# Patient Record
Sex: Male | Born: 1935 | Race: White | Hispanic: No | Marital: Married | Smoking: Never smoker
Health system: Southern US, Community
[De-identification: ages and names within clinical notes are randomized; demographics above are authoritative.]

## PROBLEM LIST (undated history)

## (undated) DIAGNOSIS — K859 Acute pancreatitis without necrosis or infection, unspecified: Secondary | ICD-10-CM

## (undated) DIAGNOSIS — J45909 Unspecified asthma, uncomplicated: Secondary | ICD-10-CM

## (undated) DIAGNOSIS — H25019 Cortical age-related cataract, unspecified eye: Secondary | ICD-10-CM

## (undated) DIAGNOSIS — M810 Age-related osteoporosis without current pathological fracture: Secondary | ICD-10-CM

## (undated) DIAGNOSIS — N189 Chronic kidney disease, unspecified: Secondary | ICD-10-CM

## (undated) DIAGNOSIS — E785 Hyperlipidemia, unspecified: Secondary | ICD-10-CM

## (undated) DIAGNOSIS — J189 Pneumonia, unspecified organism: Secondary | ICD-10-CM

## (undated) DIAGNOSIS — H9190 Unspecified hearing loss, unspecified ear: Secondary | ICD-10-CM

## (undated) DIAGNOSIS — D649 Anemia, unspecified: Secondary | ICD-10-CM

## (undated) DIAGNOSIS — S42309A Unspecified fracture of shaft of humerus, unspecified arm, initial encounter for closed fracture: Secondary | ICD-10-CM

## (undated) DIAGNOSIS — K219 Gastro-esophageal reflux disease without esophagitis: Secondary | ICD-10-CM

## (undated) DIAGNOSIS — K635 Polyp of colon: Secondary | ICD-10-CM

## (undated) DIAGNOSIS — I10 Essential (primary) hypertension: Secondary | ICD-10-CM

## (undated) DIAGNOSIS — F0390 Unspecified dementia without behavioral disturbance: Secondary | ICD-10-CM

## (undated) DIAGNOSIS — C801 Malignant (primary) neoplasm, unspecified: Secondary | ICD-10-CM

## (undated) HISTORY — PX: NASAL POLYP EXCISION: SHX2068

## (undated) HISTORY — PX: COLONOSCOPY: SHX174

## (undated) HISTORY — DX: Hyperlipidemia, unspecified: E78.5

## (undated) HISTORY — PX: BACK SURGERY: SHX140

## (undated) HISTORY — DX: Unspecified fracture of shaft of humerus, unspecified arm, initial encounter for closed fracture: S42.309A

## (undated) HISTORY — DX: Essential (primary) hypertension: I10

## (undated) HISTORY — PX: NEPHRECTOMY: SHX65

---

## 1997-03-02 HISTORY — PX: FLEXIBLE SIGMOIDOSCOPY: SHX1649

## 2000-09-11 HISTORY — PX: ESOPHAGOGASTRODUODENOSCOPY: SHX1529

## 2004-03-23 ENCOUNTER — Other Ambulatory Visit: Payer: Self-pay

## 2005-07-27 ENCOUNTER — Ambulatory Visit: Payer: Self-pay | Admitting: Unknown Physician Specialty

## 2011-02-05 ENCOUNTER — Ambulatory Visit: Payer: Self-pay | Admitting: Unknown Physician Specialty

## 2015-12-27 DIAGNOSIS — Z85828 Personal history of other malignant neoplasm of skin: Secondary | ICD-10-CM | POA: Diagnosis not present

## 2015-12-27 DIAGNOSIS — L820 Inflamed seborrheic keratosis: Secondary | ICD-10-CM | POA: Diagnosis not present

## 2015-12-27 DIAGNOSIS — D485 Neoplasm of uncertain behavior of skin: Secondary | ICD-10-CM | POA: Diagnosis not present

## 2015-12-27 DIAGNOSIS — L905 Scar conditions and fibrosis of skin: Secondary | ICD-10-CM | POA: Diagnosis not present

## 2015-12-27 DIAGNOSIS — D044 Carcinoma in situ of skin of scalp and neck: Secondary | ICD-10-CM | POA: Diagnosis not present

## 2015-12-28 DIAGNOSIS — L570 Actinic keratosis: Secondary | ICD-10-CM | POA: Diagnosis not present

## 2015-12-28 DIAGNOSIS — X32XXXA Exposure to sunlight, initial encounter: Secondary | ICD-10-CM | POA: Diagnosis not present

## 2016-01-02 DIAGNOSIS — D044 Carcinoma in situ of skin of scalp and neck: Secondary | ICD-10-CM | POA: Diagnosis not present

## 2016-01-30 DIAGNOSIS — I10 Essential (primary) hypertension: Secondary | ICD-10-CM | POA: Diagnosis not present

## 2016-02-07 DIAGNOSIS — Z0001 Encounter for general adult medical examination with abnormal findings: Secondary | ICD-10-CM | POA: Diagnosis not present

## 2016-02-07 DIAGNOSIS — I10 Essential (primary) hypertension: Secondary | ICD-10-CM | POA: Diagnosis not present

## 2016-02-07 DIAGNOSIS — E785 Hyperlipidemia, unspecified: Secondary | ICD-10-CM | POA: Diagnosis not present

## 2016-02-07 DIAGNOSIS — J4520 Mild intermittent asthma, uncomplicated: Secondary | ICD-10-CM | POA: Diagnosis not present

## 2016-02-29 DIAGNOSIS — H40003 Preglaucoma, unspecified, bilateral: Secondary | ICD-10-CM | POA: Diagnosis not present

## 2016-05-08 DIAGNOSIS — Z85828 Personal history of other malignant neoplasm of skin: Secondary | ICD-10-CM | POA: Diagnosis not present

## 2016-05-08 DIAGNOSIS — L570 Actinic keratosis: Secondary | ICD-10-CM | POA: Diagnosis not present

## 2016-05-08 DIAGNOSIS — X32XXXA Exposure to sunlight, initial encounter: Secondary | ICD-10-CM | POA: Diagnosis not present

## 2016-05-08 DIAGNOSIS — L821 Other seborrheic keratosis: Secondary | ICD-10-CM | POA: Diagnosis not present

## 2016-05-08 DIAGNOSIS — Z08 Encounter for follow-up examination after completed treatment for malignant neoplasm: Secondary | ICD-10-CM | POA: Diagnosis not present

## 2016-05-22 DIAGNOSIS — B351 Tinea unguium: Secondary | ICD-10-CM | POA: Diagnosis not present

## 2016-05-22 DIAGNOSIS — Q6689 Other  specified congenital deformities of feet: Secondary | ICD-10-CM | POA: Diagnosis not present

## 2016-05-22 DIAGNOSIS — M79674 Pain in right toe(s): Secondary | ICD-10-CM | POA: Diagnosis not present

## 2016-05-22 DIAGNOSIS — M7751 Other enthesopathy of right foot: Secondary | ICD-10-CM | POA: Diagnosis not present

## 2016-05-22 DIAGNOSIS — M79675 Pain in left toe(s): Secondary | ICD-10-CM | POA: Diagnosis not present

## 2016-07-31 DIAGNOSIS — I10 Essential (primary) hypertension: Secondary | ICD-10-CM | POA: Diagnosis not present

## 2016-08-07 DIAGNOSIS — J4520 Mild intermittent asthma, uncomplicated: Secondary | ICD-10-CM | POA: Diagnosis not present

## 2016-08-07 DIAGNOSIS — E785 Hyperlipidemia, unspecified: Secondary | ICD-10-CM | POA: Diagnosis not present

## 2016-08-07 DIAGNOSIS — Z0000 Encounter for general adult medical examination without abnormal findings: Secondary | ICD-10-CM | POA: Diagnosis not present

## 2016-08-07 DIAGNOSIS — Z125 Encounter for screening for malignant neoplasm of prostate: Secondary | ICD-10-CM | POA: Diagnosis not present

## 2016-08-07 DIAGNOSIS — I10 Essential (primary) hypertension: Secondary | ICD-10-CM | POA: Diagnosis not present

## 2016-08-07 DIAGNOSIS — Z23 Encounter for immunization: Secondary | ICD-10-CM | POA: Diagnosis not present

## 2016-10-03 DIAGNOSIS — M79675 Pain in left toe(s): Secondary | ICD-10-CM | POA: Diagnosis not present

## 2016-10-03 DIAGNOSIS — M79674 Pain in right toe(s): Secondary | ICD-10-CM | POA: Diagnosis not present

## 2016-10-03 DIAGNOSIS — L97511 Non-pressure chronic ulcer of other part of right foot limited to breakdown of skin: Secondary | ICD-10-CM | POA: Diagnosis not present

## 2016-10-03 DIAGNOSIS — B351 Tinea unguium: Secondary | ICD-10-CM | POA: Diagnosis not present

## 2016-12-11 DIAGNOSIS — Z85828 Personal history of other malignant neoplasm of skin: Secondary | ICD-10-CM | POA: Diagnosis not present

## 2016-12-11 DIAGNOSIS — C44329 Squamous cell carcinoma of skin of other parts of face: Secondary | ICD-10-CM | POA: Diagnosis not present

## 2016-12-11 DIAGNOSIS — L570 Actinic keratosis: Secondary | ICD-10-CM | POA: Diagnosis not present

## 2016-12-11 DIAGNOSIS — D485 Neoplasm of uncertain behavior of skin: Secondary | ICD-10-CM | POA: Diagnosis not present

## 2016-12-11 DIAGNOSIS — D044 Carcinoma in situ of skin of scalp and neck: Secondary | ICD-10-CM | POA: Diagnosis not present

## 2016-12-11 DIAGNOSIS — Z08 Encounter for follow-up examination after completed treatment for malignant neoplasm: Secondary | ICD-10-CM | POA: Diagnosis not present

## 2016-12-11 DIAGNOSIS — X32XXXA Exposure to sunlight, initial encounter: Secondary | ICD-10-CM | POA: Diagnosis not present

## 2016-12-11 DIAGNOSIS — L821 Other seborrheic keratosis: Secondary | ICD-10-CM | POA: Diagnosis not present

## 2017-01-16 DIAGNOSIS — L905 Scar conditions and fibrosis of skin: Secondary | ICD-10-CM | POA: Diagnosis not present

## 2017-01-16 DIAGNOSIS — C44329 Squamous cell carcinoma of skin of other parts of face: Secondary | ICD-10-CM | POA: Diagnosis not present

## 2017-01-24 DIAGNOSIS — D044 Carcinoma in situ of skin of scalp and neck: Secondary | ICD-10-CM | POA: Diagnosis not present

## 2017-01-31 DIAGNOSIS — Z125 Encounter for screening for malignant neoplasm of prostate: Secondary | ICD-10-CM | POA: Diagnosis not present

## 2017-01-31 DIAGNOSIS — E785 Hyperlipidemia, unspecified: Secondary | ICD-10-CM | POA: Diagnosis not present

## 2017-01-31 DIAGNOSIS — I10 Essential (primary) hypertension: Secondary | ICD-10-CM | POA: Diagnosis not present

## 2017-02-12 DIAGNOSIS — E785 Hyperlipidemia, unspecified: Secondary | ICD-10-CM | POA: Diagnosis not present

## 2017-02-12 DIAGNOSIS — Z0001 Encounter for general adult medical examination with abnormal findings: Secondary | ICD-10-CM | POA: Diagnosis not present

## 2017-02-12 DIAGNOSIS — J4520 Mild intermittent asthma, uncomplicated: Secondary | ICD-10-CM | POA: Diagnosis not present

## 2017-02-12 DIAGNOSIS — I10 Essential (primary) hypertension: Secondary | ICD-10-CM | POA: Diagnosis not present

## 2017-03-06 DIAGNOSIS — H40003 Preglaucoma, unspecified, bilateral: Secondary | ICD-10-CM | POA: Diagnosis not present

## 2017-04-25 DIAGNOSIS — L570 Actinic keratosis: Secondary | ICD-10-CM | POA: Diagnosis not present

## 2017-04-25 DIAGNOSIS — L821 Other seborrheic keratosis: Secondary | ICD-10-CM | POA: Diagnosis not present

## 2017-04-25 DIAGNOSIS — X32XXXA Exposure to sunlight, initial encounter: Secondary | ICD-10-CM | POA: Diagnosis not present

## 2017-04-25 DIAGNOSIS — D485 Neoplasm of uncertain behavior of skin: Secondary | ICD-10-CM | POA: Diagnosis not present

## 2017-04-25 DIAGNOSIS — C44519 Basal cell carcinoma of skin of other part of trunk: Secondary | ICD-10-CM | POA: Diagnosis not present

## 2017-04-25 DIAGNOSIS — Z08 Encounter for follow-up examination after completed treatment for malignant neoplasm: Secondary | ICD-10-CM | POA: Diagnosis not present

## 2017-04-25 DIAGNOSIS — Z85828 Personal history of other malignant neoplasm of skin: Secondary | ICD-10-CM | POA: Diagnosis not present

## 2017-05-08 DIAGNOSIS — L905 Scar conditions and fibrosis of skin: Secondary | ICD-10-CM | POA: Diagnosis not present

## 2017-05-08 DIAGNOSIS — C44519 Basal cell carcinoma of skin of other part of trunk: Secondary | ICD-10-CM | POA: Diagnosis not present

## 2017-06-03 DIAGNOSIS — M79675 Pain in left toe(s): Secondary | ICD-10-CM | POA: Diagnosis not present

## 2017-06-03 DIAGNOSIS — B351 Tinea unguium: Secondary | ICD-10-CM | POA: Diagnosis not present

## 2017-06-03 DIAGNOSIS — M79674 Pain in right toe(s): Secondary | ICD-10-CM | POA: Diagnosis not present

## 2017-08-01 DIAGNOSIS — L97511 Non-pressure chronic ulcer of other part of right foot limited to breakdown of skin: Secondary | ICD-10-CM | POA: Diagnosis not present

## 2017-08-01 DIAGNOSIS — M7751 Other enthesopathy of right foot: Secondary | ICD-10-CM | POA: Diagnosis not present

## 2017-08-01 DIAGNOSIS — Q6689 Other  specified congenital deformities of feet: Secondary | ICD-10-CM | POA: Diagnosis not present

## 2017-08-01 DIAGNOSIS — M2580 Other specified joint disorders, unspecified joint: Secondary | ICD-10-CM | POA: Diagnosis not present

## 2017-08-01 DIAGNOSIS — M7752 Other enthesopathy of left foot: Secondary | ICD-10-CM | POA: Diagnosis not present

## 2017-08-06 DIAGNOSIS — I10 Essential (primary) hypertension: Secondary | ICD-10-CM | POA: Diagnosis not present

## 2017-08-13 DIAGNOSIS — Z8619 Personal history of other infectious and parasitic diseases: Secondary | ICD-10-CM | POA: Diagnosis not present

## 2017-08-13 DIAGNOSIS — J4520 Mild intermittent asthma, uncomplicated: Secondary | ICD-10-CM | POA: Diagnosis not present

## 2017-08-13 DIAGNOSIS — Z5181 Encounter for therapeutic drug level monitoring: Secondary | ICD-10-CM | POA: Diagnosis not present

## 2017-08-13 DIAGNOSIS — I10 Essential (primary) hypertension: Secondary | ICD-10-CM | POA: Diagnosis not present

## 2017-08-13 DIAGNOSIS — E785 Hyperlipidemia, unspecified: Secondary | ICD-10-CM | POA: Diagnosis not present

## 2017-09-10 DIAGNOSIS — Z08 Encounter for follow-up examination after completed treatment for malignant neoplasm: Secondary | ICD-10-CM | POA: Diagnosis not present

## 2017-09-10 DIAGNOSIS — Z85828 Personal history of other malignant neoplasm of skin: Secondary | ICD-10-CM | POA: Diagnosis not present

## 2017-09-10 DIAGNOSIS — L570 Actinic keratosis: Secondary | ICD-10-CM | POA: Diagnosis not present

## 2017-09-10 DIAGNOSIS — X32XXXA Exposure to sunlight, initial encounter: Secondary | ICD-10-CM | POA: Diagnosis not present

## 2018-04-02 ENCOUNTER — Other Ambulatory Visit: Payer: Self-pay | Admitting: Nurse Practitioner

## 2018-04-02 DIAGNOSIS — R634 Abnormal weight loss: Secondary | ICD-10-CM

## 2018-05-06 ENCOUNTER — Ambulatory Visit
Admission: RE | Admit: 2018-05-06 | Discharge: 2018-05-06 | Disposition: A | Discharge status: Home / Self Care | Payer: Medicare HMO | Source: Ambulatory Visit | Attending: Nurse Practitioner | Admitting: Nurse Practitioner

## 2018-05-06 DIAGNOSIS — N2889 Other specified disorders of kidney and ureter: Secondary | ICD-10-CM | POA: Diagnosis not present

## 2018-05-06 DIAGNOSIS — R634 Abnormal weight loss: Secondary | ICD-10-CM | POA: Insufficient documentation

## 2018-05-06 HISTORY — DX: Unspecified asthma, uncomplicated: J45.909

## 2018-05-06 LAB — POCT I-STAT CREATININE: Creatinine, Ser: 0.7 mg/dL (ref 0.61–1.24)

## 2018-05-06 MED ORDER — IOHEXOL 300 MG/ML  SOLN
100.0000 mL | Freq: Once | INTRAMUSCULAR | Status: AC | PRN
  Administered 2018-05-06: 100 mL via INTRAVENOUS

## 2018-05-13 ENCOUNTER — Inpatient Hospital Stay: Payer: Medicare HMO

## 2018-05-13 ENCOUNTER — Other Ambulatory Visit: Payer: Self-pay

## 2018-05-13 ENCOUNTER — Inpatient Hospital Stay: Payer: Medicare HMO | Attending: Oncology | Admitting: Oncology

## 2018-05-13 ENCOUNTER — Encounter: Payer: Self-pay | Admitting: Oncology

## 2018-05-13 VITALS — BP 117/74 | HR 79 | Temp 97.6°F | Resp 18 | Ht 72.0 in | Wt 160.9 lb

## 2018-05-13 DIAGNOSIS — R634 Abnormal weight loss: Secondary | ICD-10-CM | POA: Diagnosis not present

## 2018-05-13 DIAGNOSIS — Z79899 Other long term (current) drug therapy: Secondary | ICD-10-CM | POA: Diagnosis not present

## 2018-05-13 DIAGNOSIS — Z86010 Personal history of colonic polyps: Secondary | ICD-10-CM | POA: Diagnosis not present

## 2018-05-13 DIAGNOSIS — N2889 Other specified disorders of kidney and ureter: Secondary | ICD-10-CM | POA: Insufficient documentation

## 2018-05-13 DIAGNOSIS — I700 Atherosclerosis of aorta: Secondary | ICD-10-CM | POA: Insufficient documentation

## 2018-05-13 DIAGNOSIS — Z803 Family history of malignant neoplasm of breast: Secondary | ICD-10-CM | POA: Diagnosis not present

## 2018-05-13 DIAGNOSIS — N400 Enlarged prostate without lower urinary tract symptoms: Secondary | ICD-10-CM | POA: Insufficient documentation

## 2018-05-13 DIAGNOSIS — E785 Hyperlipidemia, unspecified: Secondary | ICD-10-CM | POA: Insufficient documentation

## 2018-05-13 DIAGNOSIS — Z800 Family history of malignant neoplasm of digestive organs: Secondary | ICD-10-CM

## 2018-05-13 DIAGNOSIS — I10 Essential (primary) hypertension: Secondary | ICD-10-CM | POA: Insufficient documentation

## 2018-05-13 DIAGNOSIS — K5710 Diverticulosis of small intestine without perforation or abscess without bleeding: Secondary | ICD-10-CM | POA: Insufficient documentation

## 2018-05-13 LAB — COMPREHENSIVE METABOLIC PANEL
ALBUMIN: 4.2 g/dL (ref 3.5–5.0)
ALT: 27 U/L (ref 0–44)
ANION GAP: 9 (ref 5–15)
AST: 25 U/L (ref 15–41)
Alkaline Phosphatase: 43 U/L (ref 38–126)
BILIRUBIN TOTAL: 0.9 mg/dL (ref 0.3–1.2)
BUN: 15 mg/dL (ref 8–23)
CHLORIDE: 97 mmol/L — AB (ref 98–111)
CO2: 25 mmol/L (ref 22–32)
Calcium: 9.3 mg/dL (ref 8.9–10.3)
Creatinine, Ser: 0.67 mg/dL (ref 0.61–1.24)
GFR calc Af Amer: >60 mL/min (ref >60)
GFR calc non Af Amer: >60 mL/min (ref >60)
GLUCOSE: 94 mg/dL (ref 70–99)
POTASSIUM: 3.7 mmol/L (ref 3.5–5.1)
SODIUM: 131 mmol/L — AB (ref 135–145)
Total Protein: 7 g/dL (ref 6.5–8.1)

## 2018-05-13 LAB — URINALYSIS, COMPLETE (UACMP) WITH MICROSCOPIC
BACTERIA UA: NONE SEEN
Bilirubin Urine: NEGATIVE
Glucose, UA: NEGATIVE mg/dL
Hgb urine dipstick: NEGATIVE
KETONES UR: NEGATIVE mg/dL
LEUKOCYTES UA: NEGATIVE
NITRITE: NEGATIVE
PROTEIN: NEGATIVE mg/dL
SQUAMOUS EPITHELIAL / LPF: NONE SEEN (ref 0–5)
Specific Gravity, Urine: 1.013 (ref 1.005–1.030)
pH: 7 (ref 5.0–8.0)

## 2018-05-13 LAB — CBC WITH DIFFERENTIAL/PLATELET
BASOS ABS: 0 10*3/uL (ref 0–0.1)
BASOS PCT: 1 %
EOS ABS: 0 10*3/uL (ref 0–0.7)
Eosinophils Relative: 1 %
HEMATOCRIT: 39.8 % — AB (ref 40.0–52.0)
Hemoglobin: 13.7 g/dL (ref 13.0–18.0)
Lymphocytes Relative: 16 %
Lymphs Abs: 0.6 10*3/uL — ABNORMAL LOW (ref 1.0–3.6)
MCH: 32 pg (ref 26.0–34.0)
MCHC: 34.4 g/dL (ref 32.0–36.0)
MCV: 93.1 fL (ref 80.0–100.0)
Monocytes Absolute: 0.5 10*3/uL (ref 0.2–1.0)
Monocytes Relative: 13 %
NEUTROS ABS: 2.7 10*3/uL (ref 1.4–6.5)
Neutrophils Relative %: 69 %
PLATELETS: 182 10*3/uL (ref 150–440)
RBC: 4.27 MIL/uL — ABNORMAL LOW (ref 4.40–5.90)
RDW: 13.6 % (ref 11.5–14.5)
WBC: 3.9 10*3/uL (ref 3.8–10.6)

## 2018-05-13 LAB — LACTATE DEHYDROGENASE: LDH: 140 U/L (ref 98–192)

## 2018-05-13 MED ORDER — ALPRAZOLAM 0.5 MG PO TABS: 0.5000 mg | ORAL_TABLET | Freq: Two times a day (BID) | ORAL | 0 refills | Status: DC | PRN

## 2018-05-13 NOTE — Progress Notes (Signed)
Hematology/Oncology Consult note [facility] Telephone:([phone] [phone]   Patient Care Team: Baxter Hire, MD as PCP - General (Internal Medicine)  REFERRING PROVIDER: Denice Paradise CHIEF COMPLAINTS/REASON FOR VISIT:  Evaluation of kidney mass  HISTORY OF PRESENTING ILLNESS:  Kevin [name] is a  84 y.o.  male with PMH listed below who was referred to me for evaluation of kidney mass.  Patient was recently seen by gastroenterology Dr. Vira Agar at the request of patient's primary care physician for consultation for evaluation and management of weight loss, history of colon polyp and a family history of colon cancer. Patient reports that he has lost 20 pounds for the past several years.  May be 4 pounds over the past 6 months. Reports that patient does not eat much and try to eat healthy food. There appears to be some intentional cutting back on calories.  Patient reports he is not as hungry as he used to be. Patient remains very active for his age.  He goes to gym 3 times a week.  Denies any abdominal pain, diarrhea, constipation change of bowel habits.  Denies seeing any blood in the stool. Denies drenching night sweats, fever or chills. Per GI note, last colonoscopy was in 2012 which reviewed benign polyps and internal hemorrhoids, small area of erosion in the cecum biopsy showed chronic active colitis.  Due to patient's age, he is not quite interested to have colonoscopy done and prefers to do noninvasive test first.  So a CT abdomen Pelvis with contrast was obtained.  CT image showed a 6.7 cm mass in the upper pole of the right kidney and a 1.5 cm mass in the lower pole of the left kidney.  Both suspicious for renal cell carcinoma. There is no evidence of metastatic disease within the abdomen or pelvis. Also noted large duodenal diverticulum, mildly enlarged prostate and aortic athero-sclerosis. Patient denies any flank pain, or  hematuria. Family history reviewed. Two sisters diagnosed with breast cancer.   Reviewed patient's previous lab work in [facility]  TSH was checked on 01/31/2017, normal.  PSA 2.05,   Review of Systems  Constitutional: Positive for weight loss. Negative for chills, fever and malaise/fatigue.  HENT: Negative for nosebleeds and sore throat.   Eyes: Negative for double vision, photophobia and redness.  Respiratory: Negative for cough, shortness of breath and wheezing.   Cardiovascular: Negative for chest pain, palpitations and orthopnea.  Gastrointestinal: Negative for abdominal pain, blood in stool, nausea and vomiting.  Genitourinary: Negative for dysuria.  Musculoskeletal: Negative for back pain, myalgias and neck pain.  Skin: Negative for itching and rash.  Neurological: Negative for dizziness, tingling and tremors.  Endo/Heme/Allergies: Negative for environmental allergies. Does not bruise/bleed easily.  Psychiatric/Behavioral: Negative for depression.    MEDICAL HISTORY:  Past Medical History:  Diagnosis Date  . Asthma   . Broken arm    Pt was 19  . Hyperlipidemia   . Hypertension     SURGICAL HISTORY: History reviewed. No pertinent surgical history.  SOCIAL HISTORY: Social History   Socioeconomic History  . Marital status: Married    Spouse name: Not on file  . Number of children: Not on file  . Years of education: Not on file  . Highest education level: Not on file  Occupational History  . Not on file  Social Needs  . Financial resource strain: Not on file  . Food insecurity:    Worry: Not on file    Inability: Not on  file  . Transportation needs:    Medical: Not on file    Non-medical: Not on file  Tobacco Use  . Smoking status: Never Smoker  . Smokeless tobacco: Never Used  Substance and Sexual Activity  . Alcohol use: Never    Frequency: Never  . Drug use: Never  . Sexual activity: Not on file  Lifestyle  . Physical activity:    Days per week: Not on  file    Minutes per session: Not on file  . Stress: Not on file  Relationships  . Social connections:    Talks on phone: Not on file    Gets together: Not on file    Attends religious service: Not on file    Active member of club or organization: Not on file    Attends meetings of clubs or organizations: Not on file    Relationship status: Not on file  . Intimate partner violence:    Fear of current or ex partner: Not on file    Emotionally abused: Not on file    Physically abused: Not on file    Forced sexual activity: Not on file  Other Topics Concern  . Not on file  Social History Narrative  . Not on file    FAMILY HISTORY: Family History  Problem Relation Age of Onset  . Heart disease Mother   . Pancreatitis Mother   . Emphysema Father   . Breast cancer Sister   . Breast cancer Sister     ALLERGIES:  has No Known Allergies.  MEDICATIONS:  Current Outpatient Medications  Medication Sig Dispense Refill  . albuterol (PROVENTIL HFA) 108 (90 Base) MCG/ACT inhaler Inhale into the lungs every 6 (six) hours as needed.    Marland Kitchen amLODipine (NORVASC) 5 MG tablet Take 5 mg by mouth daily.    . hydrochlorothiazide (HYDRODIURIL) 25 MG tablet Take 25 mg by mouth daily.    . simvastatin (ZOCOR) 10 MG tablet Take 10 mg by mouth daily.    Marland Kitchen ALPRAZolam (XANAX) 0.5 MG tablet Take 1 tablet (0.5 mg total) by mouth 2 (two) times daily as needed for anxiety. 2 tablet 0   No current facility-administered medications for this visit.      PHYSICAL EXAMINATION: ECOG PERFORMANCE STATUS: 0 - Asymptomatic Vitals:   05/13/18 1115  BP: 117/74  Pulse: 79  Resp: 18  Temp: 97.6 F (36.4 C)   Filed Weights   05/13/18 1115  Weight: 160 lb 14.4 oz (73 kg)    Physical Exam  Constitutional: He is oriented to person, place, and time. He appears well-developed and well-nourished. No distress.  HENT:  Head: Normocephalic and atraumatic.  Right Ear: External ear normal.  Left Ear: External ear  normal.  Mouth/Throat: Oropharynx is clear and moist.  Eyes: Pupils are equal, round, and reactive to light. Conjunctivae and EOM are normal. No scleral icterus.  Neck: Normal range of motion. Neck supple.  Cardiovascular: Normal rate, regular rhythm and normal heart sounds.  Pulmonary/Chest: Effort normal and breath sounds normal. No respiratory distress. He has no wheezes. He has no rales. He exhibits no tenderness.  Abdominal: Soft. Bowel sounds are normal. He exhibits no distension and no mass. There is no tenderness.  Musculoskeletal: Normal range of motion. He exhibits no edema or deformity.  Lymphadenopathy:    He has no cervical adenopathy.  Neurological: He is alert and oriented to person, place, and time. No cranial nerve deficit. Coordination normal.  Skin: Skin is warm and  dry. No rash noted.  Psychiatric: He has a normal mood and affect. His behavior is normal. Thought content normal.     LABORATORY DATA:  I have reviewed the data as listed Lab Results  Component Value Date   WBC 3.9 05/13/2018   HGB 13.7 05/13/2018   HCT 39.8 (L) 05/13/2018   MCV 93.1 05/13/2018   PLT 182 05/13/2018   Recent Labs    05/06/18 0930 05/13/18 1147  NA  --  131*  K  --  3.7  CL  --  97*  CO2  --  25  GLUCOSE  --  94  BUN  --  15  CREATININE 0.70 0.67  CALCIUM  --  9.3  GFRNONAA  --  >60  GFRAA  --  >60  PROT  --  7.0  ALBUMIN  --  4.2  AST  --  25  ALT  --  27  ALKPHOS  --  43  BILITOT  --  0.9   Iron/TIBC/Ferritin/ %Sat No results found for: IRON, TIBC, FERRITIN, IRONPCTSAT      ASSESSMENT & PLAN:  1. Kidney mass   2. Weight loss    CT images were independently reviewed by me and discussed with patient and his wife. Both masses are concerning for malignancy, RCC vs other histology. Discussed with patient that I will send baseline labs today including CBC, CMP, UA and LDH. I recommend obtaining abdominal MRI with and without contrast for further evaluation. After  MRI resulted I will order CT image to complete staging. I will refer patient to see urology for evaluation of surgical resection. Adjuvant treatment plan pending on final pathology reports histology/extent of disease.  Orders Placed This Encounter  Procedures  . MR Abdomen W Wo Contrast    Standing Status:   Future    Standing Expiration Date:   05/13/2019    Order Specific Question:   If indicated for the ordered procedure, I authorize the administration of contrast media per Radiology protocol    Answer:   Yes    Order Specific Question:   What is the patient's sedation requirement?    Answer:   No Sedation    Order Specific Question:   Does the patient have a pacemaker or implanted devices?    Answer:   No    Order Specific Question:   Radiology Contrast Protocol - do NOT remove file path    Answer:   [email or web address]    Order Specific Question:   Preferred imaging location?    Answer:   [facility] (table limit-300lbs)  . CBC with Differential/Platelet    Standing Status:   Future    Number of Occurrences:   1    Standing Expiration Date:   05/14/2019  . Comprehensive metabolic panel    Standing Status:   Future    Number of Occurrences:   1    Standing Expiration Date:   05/14/2019  . Lactate dehydrogenase    Standing Status:   Future    Number of Occurrences:   1    Standing Expiration Date:   05/14/2019  . Urinalysis, Complete w Microscopic    Standing Status:   Future    Number of Occurrences:   1    Standing Expiration Date:   05/14/2019  . Ambulatory referral to Urology    Referral Priority:   Routine    Referral Type:   Consultation    Referral Reason:   Specialty Services Required  Referred to Provider:   Hollice Espy, MD    Requested Specialty:   Urology    Number of Visits Requested:   1    All questions were answered. The patient knows to call the clinic with any problems questions or concerns.  Return of visit: to be  determined.  Thank you for this kind referral and the opportunity to participate in the care of this patient. A copy of today's note is routed to referring provider  Total face to face encounter time for this patient visit was 60 min. >50% of the time was  spent in counseling and coordination of care.    Earlie Server, MD, PhD Hematology Oncology [facility] Pager- [phone] 05/13/2018

## 2018-05-13 NOTE — Progress Notes (Signed)
Patient here for initial evaluation. He states he is anxious being here.

## 2018-05-26 ENCOUNTER — Other Ambulatory Visit: Payer: Self-pay | Admitting: Oncology

## 2018-05-26 ENCOUNTER — Ambulatory Visit
Admission: RE | Admit: 2018-05-26 | Discharge: 2018-05-26 | Disposition: A | Discharge status: Home / Self Care | Payer: Medicare HMO | Source: Ambulatory Visit | Attending: Oncology | Admitting: Oncology

## 2018-05-26 DIAGNOSIS — K5710 Diverticulosis of small intestine without perforation or abscess without bleeding: Secondary | ICD-10-CM | POA: Diagnosis not present

## 2018-05-26 DIAGNOSIS — N2889 Other specified disorders of kidney and ureter: Secondary | ICD-10-CM

## 2018-05-26 DIAGNOSIS — I700 Atherosclerosis of aorta: Secondary | ICD-10-CM | POA: Diagnosis not present

## 2018-05-26 DIAGNOSIS — R634 Abnormal weight loss: Secondary | ICD-10-CM

## 2018-05-26 DIAGNOSIS — K769 Liver disease, unspecified: Secondary | ICD-10-CM | POA: Insufficient documentation

## 2018-05-26 MED ORDER — GADOBENATE DIMEGLUMINE 529 MG/ML IV SOLN
15.0000 mL | Freq: Once | INTRAVENOUS | Status: AC | PRN
  Administered 2018-05-26: 15 mL via INTRAVENOUS

## 2018-06-02 ENCOUNTER — Telehealth: Payer: Self-pay | Admitting: *Deleted

## 2018-06-02 NOTE — Telephone Encounter (Signed)
Per Velna Hatchet 06/02/18 scheduler message to cancel 06/03/18 CT scan due to insurance is still pending.  CT scheduled for 06/03/18 has been cancelled as requested.  I called and spoke with patient's wife Kevin [name]) and made her aware that once it has been approved appt will be R/S And that they will be notified in regards to the scheduled date and time.

## 2018-06-03 ENCOUNTER — Ambulatory Visit: Payer: Medicare HMO

## 2018-06-04 ENCOUNTER — Other Ambulatory Visit: Payer: Self-pay | Admitting: Radiology

## 2018-06-04 ENCOUNTER — Encounter: Payer: Self-pay | Admitting: Urology

## 2018-06-04 ENCOUNTER — Ambulatory Visit (INDEPENDENT_AMBULATORY_CARE_PROVIDER_SITE_OTHER): Payer: Medicare HMO | Admitting: Urology

## 2018-06-04 VITALS — BP 119/76 | HR 83 | Ht 72.0 in | Wt 164.0 lb

## 2018-06-04 DIAGNOSIS — N281 Cyst of kidney, acquired: Secondary | ICD-10-CM | POA: Diagnosis not present

## 2018-06-04 DIAGNOSIS — N2889 Other specified disorders of kidney and ureter: Secondary | ICD-10-CM

## 2018-06-04 NOTE — Progress Notes (Signed)
06/04/2018 9:22 AM   Kevin [name] Jan 26, 1933 [id number]  Referring provider: Baxter Hire, MD Kevin [name], Kevin [name] [id number]  Chief Complaint  Patient presents with  . Renal Mass    New Patient    HPI: 84 year old male with unintentional weight loss he underwent CT abdomen pelvis for further evaluation of this found to have a 6.7 cm right upper pole renal mass as well as a suspicious lesion measuring 1.5 cm in the left lower pole of the kidney.  Over the past several years, he has had an unintentional 20 pound weight loss.  This is not been precipitous and is almost a few pounds over the last 6 months.  He has not been actively trying to lose weight but has been physically active.  He underwent CT of the abdomen and pelvis on 05/06/2018 for further evaluation.  This showed a large heterogeneous right upper posterior lateral pole lesion measuring 6.7 x 4.7 cm which is relatively exophytic.  In addition to this, there is an increased attenuating lesion in the left lower pole measuring 1.5 x 1.3 cm.  No adenopathy or hilar involvement was noted.  No evidence of metastatic disease in the abdomen or pelvis.    Study was followed up with an MR of the abdomen with and without contrast indicating features which may be consistent with papillary type the exophytic left lower pole lesion was favored to be a Bosniak 2 lesion.  He was seen and evaluated by Dr. Tasia [name] of medical oncology and has been scheduled for a chest CT on 06/10/2018.  He denies any flank pain or gross hematuria.  He is otherwise asymptomatic.    Creatinine 0.67  Today, he is in excellent shape.  He does most of his own ADLs.  He does feel it about a month ago but his wife 32 years.  They still go to the gym on a regular basis.  PMH: Past Medical History:  Diagnosis Date  . Asthma   . Broken arm    Pt was 19  . Hyperlipidemia   . Hypertension     Surgical History: History reviewed. No pertinent  surgical history.  Home Medications:  Allergies as of 06/04/2018   No Known Allergies     Medication List        Accurate as of 06/04/18  9:22 AM. Always use your most recent med list.          ALPRAZolam 0.5 MG tablet Commonly known as:  XANAX Take 1 tablet (0.5 mg total) by mouth 2 (two) times daily as needed for anxiety.   amLODipine 5 MG tablet Commonly known as:  NORVASC Take 5 mg by mouth daily.   hydrochlorothiazide 25 MG tablet Commonly known as:  HYDRODIURIL Take 25 mg by mouth daily.   PROVENTIL HFA 108 (90 Base) MCG/ACT inhaler Generic drug:  albuterol Inhale into the lungs every 6 (six) hours as needed.   simvastatin 10 MG tablet Commonly known as:  ZOCOR Take 10 mg by mouth daily.       Allergies: No Known Allergies  Family History: Family History  Problem Relation Age of Onset  . Heart disease Mother   . Pancreatitis Mother   . Emphysema Father   . Breast cancer Sister   . Breast cancer Sister     Social History:  reports that he has never smoked. He has never used smokeless tobacco. He reports that he does not drink alcohol or use drugs.  ROS: UROLOGY Frequent Urination?: Yes Hard to postpone urination?: No Burning/pain with urination?: No Get up at night to urinate?: Yes Leakage of urine?: No Urine stream starts and stops?: Yes Trouble starting stream?: No Do you have to strain to urinate?: No Blood in urine?: No Urinary tract infection?: No Sexually transmitted disease?: No Injury to kidneys or bladder?: No Painful intercourse?: No Weak stream?: No Erection problems?: Yes Penile pain?: No  Gastrointestinal Nausea?: No Vomiting?: No Indigestion/heartburn?: Yes Diarrhea?: No Constipation?: Yes  Constitutional Fever: No Night sweats?: No Weight loss?: Yes Fatigue?: Yes  Skin Skin rash/lesions?: No Itching?: No  Eyes Blurred vision?: No Double vision?: No  Ears/Nose/Throat Sore throat?: No Sinus problems?:  No  Hematologic/Lymphatic Swollen glands?: No Easy bruising?: No  Cardiovascular Leg swelling?: No Chest pain?: No  Respiratory Cough?: No Shortness of breath?: No  Endocrine Excessive thirst?: No  Musculoskeletal Back pain?: No Joint pain?: No  Neurological Headaches?: No Dizziness?: No  Psychologic Depression?: No Anxiety?: No  Physical Exam: BP 119/76   Pulse 83   Ht 6' (1.829 m)   Wt 164 lb (74.4 kg)   BMI 22.24 kg/m   Constitutional:  Alert and oriented, No acute distress.  Accompanied by wife today. HEENT: Kevin [name] AT, moist mucus membranes.  Trachea midline, no masses. Cardiovascular: No clubbing, cyanosis, or edema. Respiratory: Normal respiratory effort, no increased work of breathing. Skin: No rashes, bruises or suspicious lesions. Neurologic: Grossly intact, no focal deficits, moving all 4 extremities. Psychiatric: Normal mood and affect.  Laboratory Data: Lab Results  Component Value Date   WBC 3.9 05/13/2018   HGB 13.7 05/13/2018   HCT 39.8 (L) 05/13/2018   MCV 93.1 05/13/2018   PLT 182 05/13/2018    Lab Results  Component Value Date   CREATININE 0.67 05/13/2018    Urinalysis    Component Value Date/Time   COLORURINE YELLOW (A) 05/13/2018 1201   APPEARANCEUR CLEAR (A) 05/13/2018 1201   LABSPEC 1.013 05/13/2018 1201   PHURINE 7.0 05/13/2018 1201   GLUCOSEU NEGATIVE 05/13/2018 1201   HGBUR NEGATIVE 05/13/2018 1201   BILIRUBINUR NEGATIVE 05/13/2018 1201   KETONESUR NEGATIVE 05/13/2018 1201   PROTEINUR NEGATIVE 05/13/2018 1201   NITRITE NEGATIVE 05/13/2018 1201   LEUKOCYTESUR NEGATIVE 05/13/2018 1201    Lab Results  Component Value Date   BACTERIA NONE SEEN 05/13/2018    Pertinent Imaging: CLINICAL DATA:  Unexplained 30 pound weight loss. Bilateral renal masses.  EXAM: MRI ABDOMEN WITHOUT AND WITH CONTRAST  TECHNIQUE: Multiplanar multisequence MR imaging of the abdomen was performed both before and after the administration  of intravenous contrast.  CONTRAST:  36mL MULTIHANCE GADOBENATE DIMEGLUMINE 529 MG/ML IV SOLN  COMPARISON:  05/06/2018 CT scan  FINDINGS: Lower chest: Unremarkable  Hepatobiliary: Several small nonenhancing hepatic lesions are compatible with cysts. Slight septation of a peripheral right hepatic lobe cyst measuring 1.3 by 1.6 cm on image 35/15, potentially with a small adjacent vascular malformation or hemangioma. Gallbladder unremarkable. No appreciable biliary dilatation.  Pancreas:  Unremarkable  Spleen:  Unremarkable  Adrenals/Urinary Tract:  The adrenal glands appear normal.  Heterogeneous 6.8 by 5.3 by 5.3 cm mass extending posterolaterally from the right kidney upper pole demonstrates mild but appreciable internal enhancement and heterogeneous but primarily low T1 and T2 signal characteristics compatible with renal cell carcinoma. The preponderance of low T2 signal would favor papillary subtype. The lesion abuts the capsular margin of the liver, without definite liver invasion. There is an incidental small adjacent hepatic cyst.  A  1.6 by 1.3 cm left kidney lower pole exophytic complex lesion has high precontrast T1 signal characteristics and does not definitively enhance. Subtraction series 20 suggests enhancement but is probably due to misregistration given the lack of significant variance in directly measured signal intensity in the lesion between the 3 minutes and precontrast images.  Several additional simple renal cysts are present.  Stomach/Bowel: Large periampullary duodenal diverticulum.  Vascular/Lymphatic: Aortoiliac atherosclerotic vascular disease. No tumor thrombus in the right renal vein. No pathologic adenopathy.  Other:  No supplemental non-categorized findings.  Musculoskeletal: Levoconvex lumbar scoliosis  IMPRESSION: 1. Complex exophytic 6.8 cm mass of the right kidney upper pole demonstrates some low-level enhancement  against a background of heterogeneous internal signal, most compatible with renal cell carcinoma. No findings of tumor thrombus in the right renal vein or adenopathy. This lesion abuts the capsular margin of the right hepatic lobe, without obvious invasion. 2. Exophytic lesion of the left kidney lower pole is not definitively enhancing based on manual signal intensity measurements, and accordingly favors a Bosniak category 2 complex cyst. Misregistration on the subtraction images likely accounts for the visibility of this lesion on the subtraction images (ordinarily, nonenhancing lesions would not be visible on subtraction imaging). Given the questionable enhancement of this lesion on CT, surveillance may be warranted. 3.  Aortic Atherosclerosis (ICD10-I70.0). 4. Large periampullary duodenal diverticulum. 5. Scattered benign-appearing hepatic lesions including cysts.   Electronically Signed   By: Van Clines M.D.   On: 05/26/2018 10:14  CLINICAL DATA:  Unintentional 30 lb weight loss over past year.  EXAM: CT ABDOMEN AND PELVIS WITH CONTRAST  TECHNIQUE: Multidetector CT imaging of the abdomen and pelvis was performed using the standard protocol following bolus administration of intravenous contrast.  CONTRAST:  115mL OMNIPAQUE IOHEXOL 300 MG/ML  SOLN  COMPARISON:  None.  FINDINGS: Lower Chest: No acute findings.  Hepatobiliary: No hepatic masses identified. Several tiny fluid attenuation hepatic cysts are noted. Gallbladder is unremarkable.  Pancreas:  No mass or inflammatory changes.  Spleen: Within normal limits in size and appearance.  Adrenals/Urinary Tract: Adrenal glands are normal in appearance. Several simple renal cysts are seen bilaterally. A large subcapsular lesion showing heterogeneous increased attenuation is seen in the posterior upper pole of the right kidney measuring 6.7 x 4.7 cm. A smaller subcapsular lesion is seen in the lateral  lower pole the left kidney which also has increased attenuation and measures 1.5 x 1.3 cm. Both of these are suspicious for solid enhancing masses. No evidence urolithiasis or hydronephrosis. Unremarkable unopacified urinary bladder.  Stomach/Bowel: No evidence of obstruction, inflammatory process or abnormal fluid collections. A large proximal duodenal diverticulum is seen measuring approximately 6 cm. Normal appendix visualized.  Vascular/Lymphatic: No pathologically enlarged lymph nodes. No abdominal aortic aneurysm. Aortic atherosclerosis.  Reproductive: Mildly enlarged prostate gland. Symmetric seminal vesicles.  Other:  None.  Musculoskeletal:  No suspicious bone lesions identified.  IMPRESSION: 6.7 cm mass in upper pole of right kidney and 1.5 cm mass in lower pole of left kidney, both suspicious for renal cell carcinoma. Abdomen MRI without and with contrast is recommended for further evaluation.  No evidence of metastatic disease within the abdomen or pelvis.  Incidentally noted: Large duodenal diverticulum, mildly enlarged prostate, and aortic atherosclerosis.   Electronically Signed   By: Earle Gell M.D.   On: 05/06/2018 12:02  Both CT abdomen pelvis and MR with and without contrast were personally reviewed today.  Agree with radiologic interpretation.  The studies were also reviewed  personally with the patient and his wife today.  Assessment & Plan:    1. Right renal mass Large exophytic right upper pole renal mass measuring 6.7 cm highly concerning for renal cell carcinoma but may be more indolent type such as papillary RCC No previous imaging for review for comparison or to establish growth rate No evidence of local invasion or local metastatic disease Agree with chest CT to ensure that there is no metastatic disease to the lung  Lesion does appear to be amenable to partial nephrectomy although this is relatively high risk surgery especially in  octogenarian As such, I strongly recommended seeing renal biopsy prior to any surgical intervention to ensure that this is a malignant tumor and not a more indolent form of cancer such as papillary type I for which surveillance may be considered He is agreeable this plan, we briefly reviewed the risk and benefits of renal biopsy - US Guided Needle Placement; Future  2. Renal cyst, left 1.5 cm left lower pole lesion of unknown certainty May represent a Bosniak lesion based on MR Will likely continue to surveillance for the time being   Return in about 2 weeks (around 06/18/2018) for f/u after renal biopsy and CT chest.  Hollice Espy, MD  [place] [address] [place], [place] [address] [phone]

## 2018-06-06 ENCOUNTER — Telehealth: Payer: Self-pay | Admitting: Radiology

## 2018-06-06 NOTE — Telephone Encounter (Signed)
Notified patient of renal mass biopsy scheduled 06/18/2018 with arrival time of 9:30 to [facility] registration desk. Npo after midnight & needs a driver. Advised to take Norvasc with a sip of water morning of procedure. Questions answered. Patient voices understanding.

## 2018-06-10 ENCOUNTER — Ambulatory Visit
Admission: RE | Admit: 2018-06-10 | Discharge: 2018-06-10 | Disposition: A | Discharge status: Home / Self Care | Payer: Medicare HMO | Source: Ambulatory Visit | Attending: Oncology | Admitting: Oncology

## 2018-06-10 DIAGNOSIS — N2889 Other specified disorders of kidney and ureter: Secondary | ICD-10-CM | POA: Insufficient documentation

## 2018-06-10 DIAGNOSIS — I700 Atherosclerosis of aorta: Secondary | ICD-10-CM | POA: Diagnosis not present

## 2018-06-10 DIAGNOSIS — E041 Nontoxic single thyroid nodule: Secondary | ICD-10-CM | POA: Insufficient documentation

## 2018-06-10 DIAGNOSIS — R911 Solitary pulmonary nodule: Secondary | ICD-10-CM | POA: Insufficient documentation

## 2018-06-10 DIAGNOSIS — R634 Abnormal weight loss: Secondary | ICD-10-CM

## 2018-06-17 ENCOUNTER — Other Ambulatory Visit: Payer: Self-pay | Admitting: Radiology

## 2018-06-18 ENCOUNTER — Ambulatory Visit
Admission: RE | Admit: 2018-06-18 | Discharge: 2018-06-18 | Disposition: A | Discharge status: Home / Self Care | Payer: Medicare HMO | Source: Ambulatory Visit | Attending: Urology | Admitting: Urology

## 2018-06-18 DIAGNOSIS — R634 Abnormal weight loss: Secondary | ICD-10-CM | POA: Insufficient documentation

## 2018-06-18 DIAGNOSIS — Z803 Family history of malignant neoplasm of breast: Secondary | ICD-10-CM | POA: Insufficient documentation

## 2018-06-18 DIAGNOSIS — J45909 Unspecified asthma, uncomplicated: Secondary | ICD-10-CM | POA: Diagnosis not present

## 2018-06-18 DIAGNOSIS — Z8489 Family history of other specified conditions: Secondary | ICD-10-CM | POA: Insufficient documentation

## 2018-06-18 DIAGNOSIS — Z6822 Body mass index (BMI) 22.0-22.9, adult: Secondary | ICD-10-CM | POA: Diagnosis not present

## 2018-06-18 DIAGNOSIS — N2889 Other specified disorders of kidney and ureter: Secondary | ICD-10-CM | POA: Diagnosis present

## 2018-06-18 DIAGNOSIS — C649 Malignant neoplasm of unspecified kidney, except renal pelvis: Secondary | ICD-10-CM | POA: Insufficient documentation

## 2018-06-18 DIAGNOSIS — I10 Essential (primary) hypertension: Secondary | ICD-10-CM | POA: Diagnosis not present

## 2018-06-18 DIAGNOSIS — Z825 Family history of asthma and other chronic lower respiratory diseases: Secondary | ICD-10-CM | POA: Insufficient documentation

## 2018-06-18 DIAGNOSIS — E785 Hyperlipidemia, unspecified: Secondary | ICD-10-CM | POA: Diagnosis not present

## 2018-06-18 DIAGNOSIS — Z8249 Family history of ischemic heart disease and other diseases of the circulatory system: Secondary | ICD-10-CM | POA: Diagnosis not present

## 2018-06-18 DIAGNOSIS — Z79899 Other long term (current) drug therapy: Secondary | ICD-10-CM | POA: Insufficient documentation

## 2018-06-18 LAB — CBC WITH DIFFERENTIAL/PLATELET
Basophils Absolute: 0 10*3/uL (ref 0–0.1)
Basophils Relative: 1 %
EOS ABS: 0.2 10*3/uL (ref 0–0.7)
Eosinophils Relative: 4 %
HCT: 40.5 % (ref 40.0–52.0)
HEMOGLOBIN: 14.1 g/dL (ref 13.0–18.0)
LYMPHS ABS: 0.8 10*3/uL — AB (ref 1.0–3.6)
Lymphocytes Relative: 17 %
MCH: 32.3 pg (ref 26.0–34.0)
MCHC: 34.9 g/dL (ref 32.0–36.0)
MCV: 92.5 fL (ref 80.0–100.0)
MONO ABS: 0.6 10*3/uL (ref 0.2–1.0)
MONOS PCT: 12 %
NEUTROS PCT: 66 %
Neutro Abs: 3.3 10*3/uL (ref 1.4–6.5)
Platelets: 184 10*3/uL (ref 150–440)
RBC: 4.38 MIL/uL — ABNORMAL LOW (ref 4.40–5.90)
RDW: 13.4 % (ref 11.5–14.5)
WBC: 4.9 10*3/uL (ref 3.8–10.6)

## 2018-06-18 LAB — BASIC METABOLIC PANEL
Anion gap: 9 (ref 5–15)
BUN: 14 mg/dL (ref 8–23)
CHLORIDE: 99 mmol/L (ref 98–111)
CO2: 28 mmol/L (ref 22–32)
CREATININE: 0.76 mg/dL (ref 0.61–1.24)
Calcium: 9.3 mg/dL (ref 8.9–10.3)
GFR calc non Af Amer: >60 mL/min (ref >60)
GLUCOSE: 95 mg/dL (ref 70–99)
Potassium: 3.4 mmol/L — ABNORMAL LOW (ref 3.5–5.1)
Sodium: 136 mmol/L (ref 135–145)

## 2018-06-18 LAB — PROTIME-INR
INR: 0.91
PROTHROMBIN TIME: 12.2 s (ref 11.4–15.2)

## 2018-06-18 MED ORDER — SODIUM CHLORIDE 0.9 % IV SOLN
INTRAVENOUS | Status: DC
  Administered 2018-06-18: 10:00:00 via INTRAVENOUS

## 2018-06-18 MED ORDER — MIDAZOLAM HCL 2 MG/2ML IJ SOLN
INTRAMUSCULAR | Status: AC | PRN
  Administered 2018-06-18: 1 mg via INTRAVENOUS

## 2018-06-18 MED ORDER — FENTANYL CITRATE (PF) 100 MCG/2ML IJ SOLN
INTRAMUSCULAR | Status: AC
  Filled 2018-06-18: qty 4

## 2018-06-18 MED ORDER — MIDAZOLAM HCL 2 MG/2ML IJ SOLN
INTRAMUSCULAR | Status: AC
  Filled 2018-06-18: qty 2

## 2018-06-18 MED ORDER — LIDOCAINE HCL 2 % IJ SOLN
INTRAMUSCULAR | Status: AC | PRN
  Administered 2018-06-18: 10 mL via INTRADERMAL

## 2018-06-18 MED ORDER — FENTANYL CITRATE (PF) 100 MCG/2ML IJ SOLN
INTRAMUSCULAR | Status: AC | PRN
  Administered 2018-06-18 (×2): 50 ug via INTRAVENOUS

## 2018-06-18 NOTE — Procedures (Signed)
Interventional Radiology Procedure Note  Procedure: US guided biopsy of right renal mass  Complications: None  Estimated Blood Loss:  < 10 mL  Findings: Right renal mass sampled via 17 G trocar needle.  18 G core biopsy performed x 3 within mass.  Gelfoam pledgets instilled via outer needle on completion.  Venetia Night. Kathlene Cote, M.D Pager:  [phone]

## 2018-06-18 NOTE — Discharge Instructions (Signed)
Moderate Conscious Sedation, Adult, Care After °These instructions provide you with information about caring for yourself after your procedure. Your health care provider may also give you more specific instructions. Your treatment has been planned according to current medical practices, but problems sometimes occur. Call your health care provider if you have any problems or questions after your procedure. °What can I expect after the procedure? °After your procedure, it is common: °· To feel sleepy for several hours. °· To feel clumsy and have poor balance for several hours. °· To have poor judgment for several hours. °· To vomit if you eat too soon. ° °Follow these instructions at home: °For at least 24 hours after the procedure: ° °· Do not: °? Participate in activities where you could fall or become injured. °? Drive. °? Use heavy machinery. °? Drink alcohol. °? Take sleeping pills or medicines that cause drowsiness. °? Make important decisions or sign legal documents. °? Take care of children on your own. °· Rest. °Eating and drinking °· Follow the diet recommended by your health care provider. °· If you vomit: °? Drink water, juice, or soup when you can drink without vomiting. °? Make sure you have little or no nausea before eating solid foods. °General instructions °· Have a responsible adult stay with you until you are awake and alert. °· Take over-the-counter and prescription medicines only as told by your health care provider. °· If you smoke, do not smoke without supervision. °· Keep all follow-up visits as told by your health care provider. This is important. °Contact a health care provider if: °· You keep feeling nauseous or you keep vomiting. °· You feel light-headed. °· You develop a rash. °· You have a fever. °Get help right away if: °· You have trouble breathing. °This information is not intended to replace advice given to you by your health care provider. Make sure you discuss any questions you have  with your health care provider. °Document Released: 07/29/2013 Document Revised: 03/12/2016 Document Reviewed: 01/28/2016 °Elsevier Interactive Patient Education © 2018 [employer] °Percutaneous Kidney Biopsy, Care After °This sheet gives you information about how to care for yourself after your procedure. Your health care provider may also give you more specific instructions. If you have problems or questions, contact your health care provider. °What can I expect after the procedure? °After the procedure, it is common to have: °· Pain or soreness near the area where the needle went through your skin (biopsy site). °· Bright pink or cloudy urine for 24 hours after the procedure. ° °Follow these instructions at home: °Activity °· Return to your normal activities as told by your health care provider. Ask your health care provider what activities are safe for you. °· Do not drive for 24 hours if you were given a medicine to help you relax (sedative). °· Do not lift anything that is heavier than 10 lb (4.5 kg) until your health care provider tells you that it is safe. °· Avoid activities that take a lot of effort (are strenuous) until your health care provider approves. Most people will have to wait 2 weeks before returning to activities such as exercise or sexual intercourse. °General instructions °· Take over-the-counter and prescription medicines only as told by your health care provider. °· You may eat and drink after your procedure. Follow instructions from your health care provider about eating or drinking restrictions. °· Check your biopsy site every day for signs of infection. Check for: °? More redness, swelling, or   pain. °? More fluid or blood. °? Warmth. °? Pus or a bad smell. °· Keep all follow-up visits as told by your health care provider. This is important. °Contact a health care provider if: °· You have more redness, swelling, or pain around your biopsy site. °· You have more fluid or blood coming from  your biopsy site. °· Your biopsy site feels warm to the touch. °· You have pus or a bad smell coming from your biopsy site. °· You have blood in your urine more than 24 hours after your procedure. °Get help right away if: °· You have dark red or brown urine. °· You have a fever. °· You are unable to urinate. °· You feel burning when you urinate. °· You feel faint. °· You have severe pain in your abdomen or side. °This information is not intended to replace advice given to you by your health care provider. Make sure you discuss any questions you have with your health care provider. °Document Released: 06/10/2013 Document Revised: 07/20/2016 Document Reviewed: 07/20/2016 °Elsevier Interactive Patient Education © 2018 [employer] ° °

## 2018-06-18 NOTE — H&P (Signed)
Chief Complaint: Patient was seen in consultation today for right renal mass biopsy at the request of [place]  Referring Physician(s): Brandon,Ashley  Patient Status: Kevin [name] - Out-pt  History of Present Illness: Kevin [name] is a 84 y.o. male with a 6.7 cm right upper pole solid renal mass demonstrating contrast enhancement by MRI and suspicious for renal carcinoma.  Another 1.5 cm lesion of lower pole of left kidney has appearance of a complex cyst by MRI.  No evidence of metastatic disease in chest, abdomen or pelvis.  Here for biopsy of the right renal mass to help with surgical decision making and establish a tissue diagnosis.  He has had significant weight loss.  No pain, hematuria or other urinary symptoms.  Past Medical History:  Diagnosis Date  . Asthma   . Broken arm    Pt was 19  . Hyperlipidemia   . Hypertension     History reviewed. No pertinent surgical history.  Allergies: Patient has no known allergies.  Medications: Prior to Admission medications   Medication Sig Start Date End Date Taking? Authorizing Provider  amLODipine (NORVASC) 5 MG tablet Take 5 mg by mouth daily. 01/22/18  Yes [provider]  hydrochlorothiazide (HYDRODIURIL) 25 MG tablet Take 25 mg by mouth daily. 01/22/18  Yes [provider]  simvastatin (ZOCOR) 10 MG tablet Take 10 mg by mouth daily. 01/22/18  Yes [provider]  albuterol (PROVENTIL HFA) 108 (90 Base) MCG/ACT inhaler Inhale into the lungs every 6 (six) hours as needed. 02/10/18 02/10/19  [provider]  ALPRAZolam Duanne Moron) 0.5 MG tablet Take 1 tablet (0.5 mg total) by mouth 2 (two) times daily as needed for anxiety. 05/13/18   Earlie Server, MD     Family History  Problem Relation Age of Onset  . Heart disease Mother   . Pancreatitis Mother   . Emphysema Father   . Breast cancer Sister   . Breast cancer Sister     Social History   Socioeconomic History  . Marital status: Married   Spouse name: Not on file  . Number of children: Not on file  . Years of education: Not on file  . Highest education level: Not on file  Occupational History  . Not on file  Social Needs  . Financial resource strain: Not on file  . Food insecurity:    Worry: Not on file    Inability: Not on file  . Transportation needs:    Medical: Not on file    Non-medical: Not on file  Tobacco Use  . Smoking status: Never Smoker  . Smokeless tobacco: Never Used  Substance and Sexual Activity  . Alcohol use: Never    Frequency: Never  . Drug use: Never  . Sexual activity: Not on file  Lifestyle  . Physical activity:    Days per week: Not on file    Minutes per session: Not on file  . Stress: Not on file  Relationships  . Social connections:    Talks on phone: Not on file    Gets together: Not on file    Attends religious service: Not on file    Active member of club or organization: Not on file    Attends meetings of clubs or organizations: Not on file    Relationship status: Not on file  Other Topics Concern  . Not on file  Social History Narrative  . Not on file    ECOG Status: 1 - Symptomatic but completely  ambulatory  Review of Systems: A 12 point ROS discussed and pertinent positives are indicated in the HPI above.  All other systems are negative.  Review of Systems  Constitutional: Positive for fatigue and unexpected weight change.  HENT: Negative.   Respiratory: Negative.   Cardiovascular: Negative.   Gastrointestinal: Negative.   Genitourinary: Negative.   Musculoskeletal: Negative.   Neurological: Negative.     Vital Signs: BP 127/76 (BP Location: Left Arm)   Pulse 80   Temp (!) 97.5 F (36.4 C) (Oral)   Resp 13   Ht 6' (1.829 m)   Wt 74.4 kg   SpO2 98%   BMI 22.24 kg/m   Physical Exam  Constitutional: He is oriented to person, place, and time. No distress.  HENT:  Head: Normocephalic and atraumatic.  Neck: Neck supple. No JVD present. No thyromegaly  present.  Cardiovascular: Normal rate, regular rhythm and normal heart sounds. Exam reveals no gallop and no friction rub.  No murmur heard. Pulmonary/Chest: Effort normal and breath sounds normal. No stridor. No respiratory distress. He has no wheezes. He has no rales.  Abdominal: Soft. Bowel sounds are normal. He exhibits no distension and no mass. There is no tenderness. There is no rebound and no guarding.  Musculoskeletal: He exhibits no edema.  Neurological: He is alert and oriented to person, place, and time.  Skin: Skin is warm and dry. He is not diaphoretic.  Vitals reviewed.   Imaging: Ct Chest Wo Contrast  Result Date: 06/10/2018 CLINICAL DATA:  Renal cell carcinoma. EXAM: CT CHEST WITHOUT CONTRAST TECHNIQUE: Multidetector CT imaging of the chest was performed following the standard protocol without IV contrast. COMPARISON:  None. FINDINGS: Cardiovascular: The heart size is normal. No substantial pericardial effusion. Coronary artery calcification is evident. Atherosclerotic calcification is noted in the wall of the thoracic aorta. Mediastinum/Nodes: No mediastinal lymphadenopathy. No evidence for gross hilar lymphadenopathy although assessment is limited by the lack of intravenous contrast on today's study. The esophagus has normal imaging features. There is no axillary lymphadenopathy. 1.7 cm right thyroid nodule evident. Lungs/Pleura: The central tracheobronchial airways are patent. 5 mm ground-glass nodule identified posterior right lower lobe (3:74). No suspicious pulmonary nodule or mass. No pleural effusion. Upper Abdomen: Low-density liver lesions better characterized on recent MRI. Heterogeneous right renal mass incompletely visualized. Musculoskeletal: No worrisome lytic or sclerotic osseous abnormality. IMPRESSION: 1. No CT findings to suggest metastatic disease in the chest. 2. 5 mm ground-glass nodule posterior right lower lobe. Likely sequelae of infectious/inflammatory  etiology. This can be reassessed on follow-up imaging for right renal mass. 3.  Aortic Atherosclerois (ICD10-170.0) 4. 1.7 cm right thyroid nodule. Thyroid ultrasound recommended to further evaluate. Electronically Signed   By: Misty Stanley M.D.   On: 06/10/2018 14:05   Mr Abdomen W Wo Contrast  Result Date: 05/26/2018 CLINICAL DATA:  Unexplained 30 pound weight loss. Bilateral renal masses. EXAM: MRI ABDOMEN WITHOUT AND WITH CONTRAST TECHNIQUE: Multiplanar multisequence MR imaging of the abdomen was performed both before and after the administration of intravenous contrast. CONTRAST:  74mL MULTIHANCE GADOBENATE DIMEGLUMINE 529 MG/ML IV SOLN COMPARISON:  05/06/2018 CT scan FINDINGS: Lower chest: Unremarkable Hepatobiliary: Several small nonenhancing hepatic lesions are compatible with cysts. Slight septation of a peripheral right hepatic lobe cyst measuring 1.3 by 1.6 cm on image 35/15, potentially with a small adjacent vascular malformation or hemangioma. Gallbladder unremarkable. No appreciable biliary dilatation. Pancreas:  Unremarkable Spleen:  Unremarkable Adrenals/Urinary Tract:  The adrenal glands appear normal. Heterogeneous 6.8 by  5.3 by 5.3 cm mass extending posterolaterally from the right kidney upper pole demonstrates mild but appreciable internal enhancement and heterogeneous but primarily low T1 and T2 signal characteristics compatible with renal cell carcinoma. The preponderance of low T2 signal would favor papillary subtype. The lesion abuts the capsular margin of the liver, without definite liver invasion. There is an incidental small adjacent hepatic cyst. A 1.6 by 1.3 cm left kidney lower pole exophytic complex lesion has high precontrast T1 signal characteristics and does not definitively enhance. Subtraction series 20 suggests enhancement but is probably due to misregistration given the lack of significant variance in directly measured signal intensity in the lesion between the 3 minutes and  precontrast images. Several additional simple renal cysts are present. Stomach/Bowel: Large periampullary duodenal diverticulum. Vascular/Lymphatic: Aortoiliac atherosclerotic vascular disease. No tumor thrombus in the right renal vein. No pathologic adenopathy. Other:  No supplemental non-categorized findings. Musculoskeletal: Levoconvex lumbar scoliosis IMPRESSION: 1. Complex exophytic 6.8 cm mass of the right kidney upper pole demonstrates some low-level enhancement against a background of heterogeneous internal signal, most compatible with renal cell carcinoma. No findings of tumor thrombus in the right renal vein or adenopathy. This lesion abuts the capsular margin of the right hepatic lobe, without obvious invasion. 2. Exophytic lesion of the left kidney lower pole is not definitively enhancing based on manual signal intensity measurements, and accordingly favors a Bosniak category 2 complex cyst. Misregistration on the subtraction images likely accounts for the visibility of this lesion on the subtraction images (ordinarily, nonenhancing lesions would not be visible on subtraction imaging). Given the questionable enhancement of this lesion on CT, surveillance may be warranted. 3.  Aortic Atherosclerosis (ICD10-I70.0). 4. Large periampullary duodenal diverticulum. 5. Scattered benign-appearing hepatic lesions including cysts. Electronically Signed   By: Van Clines M.D.   On: 05/26/2018 10:14    Labs:  CBC: Recent Labs    05/13/18 1147 06/18/18 0944  WBC 3.9 4.9  HGB 13.7 14.1  HCT 39.8* 40.5  PLT 182 184    COAGS: Recent Labs    06/18/18 0944  INR 0.91    BMP: Recent Labs    05/06/18 0930 05/13/18 1147 06/18/18 0944  NA  --  131* 136  K  --  3.7 3.4*  CL  --  97* 99  CO2  --  25 28  GLUCOSE  --  94 95  BUN  --  15 14  CALCIUM  --  9.3 9.3  CREATININE 0.70 0.67 0.76  GFRNONAA  --  >60 >60  GFRAA  --  >60 >60    LIVER FUNCTION TESTS: Recent Labs     05/13/18 1147  BILITOT 0.9  AST 25  ALT 27  ALKPHOS 43  PROT 7.0  ALBUMIN 4.2    Assessment and Plan:  For ultrasound guided biopsy of the nearly 7 cm right UP renal mass today.  Risks and benefits discussed with the patient including, but not limited to bleeding, infection, damage to adjacent structures or low yield requiring additional tests. All of the patient's questions were answered, patient is agreeable to proceed. Consent signed and in chart.  Thank you for this interesting consult.  I greatly enjoyed meeting CHATHAM HOWINGTON and look forward to participating in their care.  A copy of this report was sent to the requesting provider on this date.  Electronically Signed: Azzie Roup, MD 06/18/2018, 12:15 PM     I spent a total of 30 Minutes in face to face in  clinical consultation, greater than 50% of which was counseling/coordinating care for biopsy of right renal mass.

## 2018-06-25 LAB — SURGICAL PATHOLOGY

## 2018-07-02 ENCOUNTER — Encounter: Payer: Self-pay | Admitting: Urology

## 2018-07-02 ENCOUNTER — Ambulatory Visit: Payer: Medicare HMO | Admitting: Urology

## 2018-07-02 VITALS — BP 105/70 | HR 86 | Ht 72.0 in | Wt 165.0 lb

## 2018-07-02 DIAGNOSIS — N2889 Other specified disorders of kidney and ureter: Secondary | ICD-10-CM | POA: Diagnosis not present

## 2018-07-02 DIAGNOSIS — E041 Nontoxic single thyroid nodule: Secondary | ICD-10-CM | POA: Diagnosis not present

## 2018-07-02 NOTE — Progress Notes (Signed)
07/02/2018 8:33 PM   Ausar [name] [birth year] [id number]  Referring provider: Baxter Hire, MD [place], [place] [address]  Chief Complaint  Patient presents with  . Renal Mass    2wk    HPI: 84 year old male with a 6.7 right upper pole renal mass as well as a suspicious 1.5 cm left lower pole renal mass returns today following biopsy of the larger right-sided lesion.  He underwent CT of the abdomen and pelvis on 05/06/2018 for further evaluation.  This showed a large heterogeneous right upper posterior lateral pole lesion measuring 6.7 x 4.7 cm which is relatively exophytic.  In addition to this, there is an increased attenuating lesion in the left lower pole measuring 1.5 x 1.3 cm.  No adenopathy or hilar involvement was noted.  No evidence of metastatic disease in the abdomen or pelvis.    Study was followed up with an MR of the abdomen with and without contrast indicating features which may be consistent with papillary type the exophytic left lower pole lesion was favored to be a Bosniak 2 lesion.  Biopsy of the renal mass discovered from his infected renal cell carcinoma, non-clear cell type with somewhat unusual architecture.  There is extensive hemorrhage with neoplastic cells are primarily oncocytic with a tubular growth pattern.  There was a focus of bland spindle cells with scant clear cytoplasm.  Case was discussed with the pathologist, this could represent an indolent type of renal cell carcinoma, however, a more aggressive form cannot be ruled out based on this pathology.  He tolerated biopsy well.    He is also since undergone CT of the chest without contrast revealing a 5 mm groundglass nodule in the right lower lobe felt to be likely infectious versus inflammatory.  An incidental 1.7 cm right thyroid nodule was also identified incidentally.  He denies any flank pain or gross hematuria.  He is otherwise asymptomatic.    Creatinine  0.67   PMH: Past Medical History:  Diagnosis Date  . Asthma   . Broken arm    Pt was 19  . Hyperlipidemia   . Hypertension     Surgical History: No past surgical history on file.  Home Medications:  Allergies as of 07/02/2018   No Known Allergies     Medication List        Accurate as of 07/02/18  8:33 PM. Always use your most recent med list.          ALPRAZolam 0.5 MG tablet Commonly known as:  XANAX Take 1 tablet (0.5 mg total) by mouth 2 (two) times daily as needed for anxiety.   amLODipine 5 MG tablet Commonly known as:  NORVASC Take 5 mg by mouth daily.   hydrochlorothiazide 25 MG tablet Commonly known as:  HYDRODIURIL Take 25 mg by mouth daily.   PROVENTIL HFA 108 (90 Base) MCG/ACT inhaler Generic drug:  albuterol Inhale into the lungs every 6 (six) hours as needed.   simvastatin 10 MG tablet Commonly known as:  ZOCOR Take 10 mg by mouth daily.       Allergies: No Known Allergies  Family History: Family History  Problem Relation Age of Onset  . Heart disease Mother   . Pancreatitis Mother   . Emphysema Father   . Breast cancer Sister   . Breast cancer Sister     Social History:  reports that he has never smoked. He has never used smokeless tobacco. He reports that he does  not drink alcohol or use drugs.  ROS: UROLOGY Frequent Urination?: Yes Hard to postpone urination?: No Burning/pain with urination?: No Get up at night to urinate?: Yes Leakage of urine?: Yes Urine stream starts and stops?: Yes Trouble starting stream?: No Do you have to strain to urinate?: No Blood in urine?: No Urinary tract infection?: No Sexually transmitted disease?: No Injury to kidneys or bladder?: No Painful intercourse?: No Weak stream?: No Erection problems?: No Penile pain?: No  Gastrointestinal Nausea?: No Vomiting?: No Indigestion/heartburn?: No Diarrhea?: No Constipation?: No  Constitutional Fever: No Night sweats?: No Weight loss?:  Yes Fatigue?: No  Skin Skin rash/lesions?: No Itching?: No  Eyes Blurred vision?: No Double vision?: No  Ears/Nose/Throat Sore throat?: No Sinus problems?: No  Hematologic/Lymphatic Swollen glands?: No Easy bruising?: No  Cardiovascular Leg swelling?: No Chest pain?: No  Respiratory Cough?: No Shortness of breath?: No  Endocrine Excessive thirst?: No  Musculoskeletal Back pain?: No Joint pain?: No  Neurological Headaches?: No Dizziness?: No  Psychologic Depression?: No Anxiety?: No  Physical Exam: BP 105/70   Pulse 86   Ht 6' (1.829 m)   Wt 165 lb (74.8 kg)   BMI 22.38 kg/m   Constitutional:  Alert and oriented, No acute distress. HEENT: [place] AT, moist mucus membranes.  Trachea midline, no masses. Cardiovascular: No clubbing, cyanosis, or edema. Respiratory: Normal respiratory effort, no increased work of breathing. GI: Abdomen is soft, nontender, nondistended, no abdominal masses GU: No CVA tenderness Skin: No rashes, bruises or suspicious lesions. Neurologic: Grossly intact, no focal deficits, moving all 4 extremities. Psychiatric: Normal mood and affect.  Laboratory Data: Lab Results  Component Value Date   WBC 4.9 06/18/2018   HGB 14.1 06/18/2018   HCT 40.5 06/18/2018   MCV 92.5 06/18/2018   PLT 184 06/18/2018    Lab Results  Component Value Date   CREATININE 0.76 06/18/2018   LFT wnl Ca 9.3 Alk phos 43  Urinalysis N/a  Pertinent Imaging: CLINICAL DATA:  Renal cell carcinoma.  EXAM: CT CHEST WITHOUT CONTRAST  TECHNIQUE: Multidetector CT imaging of the chest was performed following the standard protocol without IV contrast.  COMPARISON:  None.  FINDINGS: Cardiovascular: The heart size is normal. No substantial pericardial effusion. Coronary artery calcification is evident. Atherosclerotic calcification is noted in the wall of the thoracic aorta.  Mediastinum/Nodes: No mediastinal lymphadenopathy. No evidence  for gross hilar lymphadenopathy although assessment is limited by the lack of intravenous contrast on today's study. The esophagus has normal imaging features. There is no axillary lymphadenopathy. 1.7 cm right thyroid nodule evident.  Lungs/Pleura: The central tracheobronchial airways are patent. 5 mm ground-glass nodule identified posterior right lower lobe (3:74). No suspicious pulmonary nodule or mass. No pleural effusion.  Upper Abdomen: Low-density liver lesions better characterized on recent MRI. Heterogeneous right renal mass incompletely visualized.  Musculoskeletal: No worrisome lytic or sclerotic osseous abnormality.  IMPRESSION: 1. No CT findings to suggest metastatic disease in the chest. 2. 5 mm ground-glass nodule posterior right lower lobe. Likely sequelae of infectious/inflammatory etiology. This can be reassessed on follow-up imaging for right renal mass. 3.  Aortic Atherosclerois (ICD10-170.0) 4. 1.7 cm right thyroid nodule. Thyroid ultrasound recommended to further evaluate.   Electronically Signed   By: Misty Stanley M.D.   On: 06/10/2018 14:05  Assessment & Plan:    1. Right renal mass 84 year old with a 6.7 cm incidental right renal mass, biopsy consistent with non-clear cell renal cell carcinoma, likely indolent less aggressive form of cancer  however there is a small focus of more concerning tumor in the biopsy specimen thus sampling error cannot be ruled out.  Pathology was reviewed with the patient in detail.  He has no evidence of metastatic disease.  He may have a second primary on the left, see below.  Excellent function status.    We reviewed options today again in detail.  Given his age, observation could be considered although there is a risk of progression as well as development of interval metastatic disease with unclear timeline.  Overall his functional status is excellent.  Alternatives include nephrectomy versus partial nephrectomy.   Generally, given the size and location of the lesion, partial nephrectomy would be somewhat technically challenging, however, the lesion is relatively exophytic thus he may be a candidate for this.  We discussed partial versus radical robotic prostatectomy today in detail.  We discussed the risks of surgeries including risk of bleeding, infection, damage surrounding structures, urine leak, fistula formation, delayed bleeding, as well as more major complications including heart attack stroke and death.  We discussed today that in general, nephron sparing surgery would be preferred in order to reduce the risk of progression to end-stage renal disease.  After lengthy discussion, he would be interested in attempted right robotic partial nephrectomy with low threshold to convert to radical nephrectomy if deemed too complicated.  All questions answered in detail.    2. Left renal mass Recommend surveillance at this time with consideration of ablative therapy if progresses   3. Thyroid nodule Thyroid US Biopsy if deemed nessessary Metastatic disease less likely - US THYROID; Future   Hollice Espy, MD  [facility] [address] [place], [place] [address] [phone]  I spent 25 min with this patient of which greater than 50% was spent in counseling and coordination of care with the patient.

## 2018-07-11 ENCOUNTER — Ambulatory Visit
Admission: RE | Admit: 2018-07-11 | Discharge: 2018-07-11 | Disposition: A | Discharge status: Home / Self Care | Payer: Medicare HMO | Source: Ambulatory Visit | Attending: Urology | Admitting: Urology

## 2018-07-11 DIAGNOSIS — E041 Nontoxic single thyroid nodule: Secondary | ICD-10-CM | POA: Insufficient documentation

## 2018-07-14 ENCOUNTER — Other Ambulatory Visit: Payer: Self-pay | Admitting: Radiology

## 2018-07-14 ENCOUNTER — Telehealth: Payer: Self-pay | Admitting: Urology

## 2018-07-14 DIAGNOSIS — E041 Nontoxic single thyroid nodule: Secondary | ICD-10-CM

## 2018-07-14 NOTE — Telephone Encounter (Signed)
Please help arrange ultrasound-guided biopsy of thyroid nodule.  Hollice Espy, MD

## 2018-07-15 NOTE — Telephone Encounter (Signed)
Notified wife (as patient is hard of hearing on the phone) of recommendation of thyroid biopsy. Biopsy has been scheduled 07/22/2018 with arrival of 12:30 to [facility] registration. Advised that patient have nothing by mouth after midnight on 07/21/2018 except to take amlodipine with a sip of water on the morning of the procedure & that patient will need a driver. Questions answered. Wife voices understanding.

## 2018-07-15 NOTE — Telephone Encounter (Signed)
-----   Message from Hollice Espy, MD sent at 07/14/2018 10:13 AM EDT ----- Please let this patient know that the radiologist has recommended a biopsy of his thyroid nodule.  I will help arrange for this.  Hollice Espy, MD

## 2018-07-22 ENCOUNTER — Ambulatory Visit
Admission: RE | Admit: 2018-07-22 | Discharge: 2018-07-22 | Disposition: A | Discharge status: Home / Self Care | Payer: Medicare HMO | Source: Ambulatory Visit | Attending: Urology | Admitting: Urology

## 2018-07-22 DIAGNOSIS — C801 Malignant (primary) neoplasm, unspecified: Secondary | ICD-10-CM

## 2018-07-22 DIAGNOSIS — E041 Nontoxic single thyroid nodule: Secondary | ICD-10-CM

## 2018-07-22 HISTORY — DX: Malignant (primary) neoplasm, unspecified: C80.1

## 2018-07-22 NOTE — Procedures (Signed)
Right thyroid fna without difficulty  Complications:  None  Blood Loss: none  See dictation in canopy pacs

## 2018-07-23 LAB — CYTOLOGY - NON PAP

## 2018-07-24 ENCOUNTER — Telehealth: Payer: Self-pay | Admitting: Family Medicine

## 2018-07-24 NOTE — Telephone Encounter (Signed)
LMOM for patient to return call.

## 2018-07-24 NOTE — Telephone Encounter (Signed)
-----   Message from Hollice Espy, MD sent at 07/24/2018 10:14 AM EDT ----- Please let this patient know that his thyroid biopsy was negative, no evidence of malignancy.  Hollice Espy, MD

## 2018-07-24 NOTE — Telephone Encounter (Signed)
Patient notified

## 2018-07-28 ENCOUNTER — Telehealth: Payer: Self-pay | Admitting: Urology

## 2018-07-28 NOTE — Telephone Encounter (Signed)
Please advise 

## 2018-07-28 NOTE — Telephone Encounter (Signed)
He is book for surgery for his renal mass as discussed.    Hollice Espy, MD

## 2018-07-28 NOTE — Telephone Encounter (Signed)
Pt had negative thyroid biopsy and wants to know what they should do next since he has a mass on kidney.  Please give pt a call [phone].

## 2018-07-29 ENCOUNTER — Other Ambulatory Visit: Payer: Self-pay | Admitting: Radiology

## 2018-07-29 DIAGNOSIS — N2889 Other specified disorders of kidney and ureter: Secondary | ICD-10-CM

## 2018-07-29 NOTE — Telephone Encounter (Signed)
Called patient to discuss scheduling right robotic partial vs radical nephrectomy. Patient asked for an appointment to discuss further with Dr Erlene Quan. Appointment made. Questions answered. Patient voices understanding.

## 2018-08-06 ENCOUNTER — Encounter: Payer: Self-pay | Admitting: Urology

## 2018-08-06 ENCOUNTER — Ambulatory Visit: Payer: Medicare HMO | Admitting: Urology

## 2018-08-06 VITALS — BP 151/79 | HR 87 | Ht 72.0 in | Wt 167.8 lb

## 2018-08-06 DIAGNOSIS — E041 Nontoxic single thyroid nodule: Secondary | ICD-10-CM | POA: Diagnosis not present

## 2018-08-06 DIAGNOSIS — N2889 Other specified disorders of kidney and ureter: Secondary | ICD-10-CM | POA: Diagnosis not present

## 2018-08-06 NOTE — Progress Notes (Signed)
08/06/2018 4:27 PM   Kevin [name] November 19, 1932 [id number]  Referring provider: Baxter Hire, MD [place], [place] [address]  Chief Complaint  Patient presents with  . Follow-up    HPI: 84 year old male with a 6.7 right upper pole renal mass as well as a suspicious 1.5 cm left lower pole renal mass returns today following thyroid biopsy returns today to review options again.  Since last visit, he underwent a biopsy of a thyroid nodule which was consistent with benign pathology, nodule.  He specifically has questions today about when to proceed with surgery.  He was originally planning to have surgery in the near future, however, his wife has recently undergone cataract surgery needs her other eye done.  She is also traveling back and forth to [facility] for Mohs surgery on her leg.  They like to try to hold off until after family vacation at the beach over Christmas.  He remains asymptomatic from the mass.  No flank pain or gross hematuria.  He has had 30 pound weight loss over the last several years.  Previous history: He underwent CT of the abdomen and pelvis on 05/06/2018 for further evaluation. This showed a large heterogeneous right upper posterior lateral pole lesion measuring 6.7 x 4.7 cm which is relatively exophytic. In addition to this, there is an increased attenuating lesion in the left lower pole measuring 1.5 x 1.3 cm. No adenopathy or hilar involvement was noted. No evidence of metastatic disease in the abdomen or pelvis.   Study was followed up with an MR of the abdomen with and without contrast indicating features which may be consistent with papillary type the exophytic left lower pole lesion was favored to be a Bosniak 2 lesion.  Biopsy of the renal mass c/w renal cell carcinoma, non-clear cell type with somewhat unusual architecture.  There is extensive hemorrhage with neoplastic cells are primarily oncocytic with a tubular growth pattern.  There  was a focus of bland spindle cells with scant clear cytoplasm.  Case was discussed with the pathologist, this could represent an indolent type of renal cell carcinoma, however, a more aggressive form cannot be ruled out based on this pathology.  He is also since undergone CT of the chest without contrast revealing a 5 mm groundglass nodule in the right lower lobe felt to be likely infectious versus inflammatory.  An incidental 1.7 cm right thyroid nodule was also identified incidentally.  Creatinine 0.67   PMH: Past Medical History:  Diagnosis Date  . Asthma   . Broken arm    Pt was 19  . Hyperlipidemia   . Hypertension     Surgical History: History reviewed. No pertinent surgical history.  Home Medications:  Allergies as of 08/06/2018   No Known Allergies     Medication List        Accurate as of 08/06/18  4:27 PM. Always use your most recent med list.          ALPRAZolam 0.5 MG tablet Commonly known as:  XANAX Take 1 tablet (0.5 mg total) by mouth 2 (two) times daily as needed for anxiety.   amLODipine 5 MG tablet Commonly known as:  NORVASC Take 5 mg by mouth daily.   hydrochlorothiazide 25 MG tablet Commonly known as:  HYDRODIURIL Take 25 mg by mouth daily.   PRESERVISION AREDS 2 PO Take 1 tablet by mouth daily.   PROVENTIL HFA 108 (90 Base) MCG/ACT inhaler Generic drug:  albuterol Inhale 2 puffs  into the lungs every 6 (six) hours as needed for wheezing or shortness of breath.   simvastatin 10 MG tablet Commonly known as:  ZOCOR Take 10 mg by mouth daily.       Allergies: No Known Allergies  Family History: Family History  Problem Relation Age of Onset  . Heart disease Mother   . Pancreatitis Mother   . Emphysema Father   . Breast cancer Sister   . Breast cancer Sister     Social History:  reports that he has never smoked. He has never used smokeless tobacco. He reports that he does not drink alcohol or use drugs.  ROS: UROLOGY Frequent  Urination?: Yes Hard to postpone urination?: Yes Burning/pain with urination?: No Get up at night to urinate?: Yes Leakage of urine?: Yes Urine stream starts and stops?: No Trouble starting stream?: No Do you have to strain to urinate?: No Blood in urine?: No Urinary tract infection?: No Sexually transmitted disease?: No Injury to kidneys or bladder?: No Painful intercourse?: No Weak stream?: No Erection problems?: No Penile pain?: No  Gastrointestinal Nausea?: No Vomiting?: No Indigestion/heartburn?: No Diarrhea?: No Constipation?: Yes  Constitutional Fever: No Night sweats?: No Weight loss?: Yes Fatigue?: No  Skin Skin rash/lesions?: No Itching?: No  Eyes Blurred vision?: No Double vision?: No  Ears/Nose/Throat Sore throat?: No Sinus problems?: No  Hematologic/Lymphatic Swollen glands?: No Easy bruising?: No  Cardiovascular Leg swelling?: No Chest pain?: No  Respiratory Cough?: No Shortness of breath?: No  Endocrine Excessive thirst?: No  Musculoskeletal Back pain?: No Joint pain?: No  Neurological Headaches?: No Dizziness?: No  Psychologic Depression?: No Anxiety?: No  Physical Exam: BP (!) 151/79 (BP Location: Left Arm, Patient Position: Sitting, Cuff Size: Normal)   Pulse 87   Ht 6' (1.829 m)   Wt 167 lb 12.8 oz (76.1 kg)   BMI 22.76 kg/m   Constitutional:  Alert and oriented, No acute distress.  Accompanied by wife. HEENT: [place] AT, moist mucus membranes.  Trachea midline, no masses. Cardiovascular: No clubbing, cyanosis, or edema. Respiratory: Normal respiratory effort, no increased work of breathing. Skin: No rashes, bruises or suspicious lesions. Neurologic: Grossly intact, no focal deficits, moving all 4 extremities. Psychiatric: Normal mood and affect.  Laboratory Data: Lab Results  Component Value Date   WBC 4.9 06/18/2018   HGB 14.1 06/18/2018   HCT 40.5 06/18/2018   MCV 92.5 06/18/2018   PLT 184 06/18/2018    Lab  Results  Component Value Date   CREATININE 0.76 06/18/2018    Urinalysis NA  Pertinent Imaging: NA  Assessment & Plan:    1. Right renal mass Previously discussed options at length, see previous note for details He ultimately has elected to pursue right robotic partial nephrectomy with low threshold to convert to radical nephrectomy as deemed necessary intraoperatively given the size of the lesion Risk and benefits were reviewed again in detail At this point time, he would like to hold off his surgery until after Christmas Explained that given the size of the lesion it is likely that its been there for quite some time without evidence of metastatic disease As such, waiting is after Christmas is reasonable, however, no guarantees can be made that this will be no interval development of metastatic disease next and he understands this and is willing to wait   I would like to repeat his CT abdomen with contrast for surgical planning purposes in about 3 months prior to surgery to ensure that there is been  no interval change.  He is agreeable this plan. -CT Abd w/ contast 3 mo  2. Left renal mass Recommend surveillance at this time with consideration of ablative therapy if progresses   3. Thyroid nodule s/p biopsy biopsy consistent with benign colloid lesion  Surgery to be schedule Doristine Locks, MD  [facility] [address], [place], [place] [address] [phone]  I spent 25 min with this patient of which greater than 50% was spent in counseling and coordination of care with the patient.

## 2018-08-12 ENCOUNTER — Inpatient Hospital Stay: Admission: RE | Admit: 2018-08-12 | Payer: Medicare HMO | Source: Ambulatory Visit

## 2018-10-16 ENCOUNTER — Telehealth: Payer: Self-pay | Admitting: Radiology

## 2018-10-16 ENCOUNTER — Other Ambulatory Visit: Payer: Self-pay | Admitting: Radiology

## 2018-10-16 DIAGNOSIS — N2889 Other specified disorders of kidney and ureter: Secondary | ICD-10-CM

## 2018-10-16 NOTE — Telephone Encounter (Signed)
LMOM to return call. Need to discuss upcoming robotic partial nephrectomy instructions.

## 2018-10-16 NOTE — Telephone Encounter (Signed)
On speakerphone with wife & patient, discussed surgery date as well as pre-op appointments with Dr Erlene Quan & pre-admission testing. Advised patient to have a CT prior to visit with Dr Erlene Quan & that radiology scheduling will call to make the appointment. Wife & patient express understanding of conversation.

## 2018-10-17 ENCOUNTER — Other Ambulatory Visit: Payer: Self-pay | Admitting: Radiology

## 2018-10-22 DIAGNOSIS — A498 Other bacterial infections of unspecified site: Secondary | ICD-10-CM

## 2018-10-22 HISTORY — PX: NEPHRECTOMY: SHX65

## 2018-10-22 HISTORY — DX: Other bacterial infections of unspecified site: A49.8

## 2018-10-30 ENCOUNTER — Ambulatory Visit
Admission: RE | Admit: 2018-10-30 | Discharge: 2018-10-30 | Disposition: A | Discharge status: Home / Self Care | Payer: Medicare HMO | Source: Ambulatory Visit | Attending: Urology | Admitting: Urology

## 2018-10-30 ENCOUNTER — Other Ambulatory Visit: Payer: Self-pay | Admitting: Radiology

## 2018-10-30 DIAGNOSIS — N2889 Other specified disorders of kidney and ureter: Secondary | ICD-10-CM | POA: Diagnosis not present

## 2018-10-30 LAB — POCT I-STAT CREATININE: Creatinine, Ser: 0.8 mg/dL (ref 0.61–1.24)

## 2018-10-30 MED ORDER — IOPAMIDOL (ISOVUE-300) INJECTION 61%
100.0000 mL | Freq: Once | INTRAVENOUS | Status: AC | PRN
  Administered 2018-10-30: 100 mL via INTRAVENOUS

## 2018-11-03 ENCOUNTER — Other Ambulatory Visit: Payer: Self-pay

## 2018-11-03 ENCOUNTER — Encounter
Admission: RE | Admit: 2018-11-03 | Discharge: 2018-11-03 | Disposition: A | Discharge status: Home / Self Care | Payer: Medicare HMO | Source: Ambulatory Visit | Attending: Urology | Admitting: Urology

## 2018-11-03 DIAGNOSIS — R9431 Abnormal electrocardiogram [ECG] [EKG]: Secondary | ICD-10-CM | POA: Diagnosis not present

## 2018-11-03 DIAGNOSIS — Z01818 Encounter for other preprocedural examination: Secondary | ICD-10-CM | POA: Insufficient documentation

## 2018-11-03 DIAGNOSIS — I452 Bifascicular block: Secondary | ICD-10-CM | POA: Diagnosis not present

## 2018-11-03 DIAGNOSIS — I10 Essential (primary) hypertension: Secondary | ICD-10-CM | POA: Insufficient documentation

## 2018-11-03 DIAGNOSIS — I444 Left anterior fascicular block: Secondary | ICD-10-CM | POA: Insufficient documentation

## 2018-11-03 DIAGNOSIS — I4510 Unspecified right bundle-branch block: Secondary | ICD-10-CM | POA: Diagnosis not present

## 2018-11-03 HISTORY — DX: Pneumonia, unspecified organism: J18.9

## 2018-11-03 HISTORY — DX: Polyp of colon: K63.5

## 2018-11-03 HISTORY — DX: Cortical age-related cataract, unspecified eye: H25.019

## 2018-11-03 HISTORY — DX: Malignant (primary) neoplasm, unspecified: C80.1

## 2018-11-03 HISTORY — DX: Gastro-esophageal reflux disease without esophagitis: K21.9

## 2018-11-03 LAB — CBC
HCT: 40.7 % (ref 39.0–52.0)
Hemoglobin: 14 g/dL (ref 13.0–17.0)
MCH: 31 pg (ref 26.0–34.0)
MCHC: 34.4 g/dL (ref 30.0–36.0)
MCV: 90.2 fL (ref 80.0–100.0)
NRBC: 0 % (ref 0.0–0.2)
Platelets: 180 10*3/uL (ref 150–400)
RBC: 4.51 MIL/uL (ref 4.22–5.81)
RDW: 12 % (ref 11.5–15.5)
WBC: 4.5 10*3/uL (ref 4.0–10.5)

## 2018-11-03 LAB — BASIC METABOLIC PANEL
Anion gap: 7 (ref 5–15)
BUN: 15 mg/dL (ref 8–23)
CALCIUM: 9.2 mg/dL (ref 8.9–10.3)
CO2: 27 mmol/L (ref 22–32)
CREATININE: 0.84 mg/dL (ref 0.61–1.24)
Chloride: 100 mmol/L (ref 98–111)
GFR calc non Af Amer: >60 mL/min (ref >60)
Glucose, Bld: 94 mg/dL (ref 70–99)
Potassium: 3.5 mmol/L (ref 3.5–5.1)
SODIUM: 134 mmol/L — AB (ref 135–145)

## 2018-11-03 LAB — URINALYSIS, ROUTINE W REFLEX MICROSCOPIC
Bacteria, UA: NONE SEEN
Bilirubin Urine: NEGATIVE
GLUCOSE, UA: NEGATIVE mg/dL
Ketones, ur: NEGATIVE mg/dL
Leukocytes, UA: NEGATIVE
Nitrite: NEGATIVE
Protein, ur: NEGATIVE mg/dL
Specific Gravity, Urine: 1.013 (ref 1.005–1.030)
Squamous Epithelial / HPF: NONE SEEN (ref 0–5)
pH: 6 (ref 5.0–8.0)

## 2018-11-03 LAB — APTT: aPTT: 29 seconds (ref 24–36)

## 2018-11-03 LAB — PROTIME-INR
INR: 0.93
Prothrombin Time: 12.4 seconds (ref 11.4–15.2)

## 2018-11-03 NOTE — Anesthesia Preprocedure Evaluation (Addendum)
Anesthesia Evaluation  Patient identified by MRN, date of birth, ID band Patient awake    Reviewed: Allergy & Precautions, NPO status , Patient's Chart, lab work & pertinent test results  History of Anesthesia Complications Negative for: history of anesthetic complications  Airway Mallampati: II  TM Distance: <3 FB Neck ROM: Full    Dental no notable dental hx.    Pulmonary asthma (has not used inhaler in last year) , neg sleep apnea,    breath sounds clear to auscultation- rhonchi (-) wheezing      Cardiovascular Exercise Tolerance: Good hypertension, Pt. 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 No date: Cataract cortical, senile No date: Colon polyps No date: GERD (gastroesophageal reflux disease) No date: Hyperlipidemia No date: Hypertension No date: Pneumonia     Comment:  AS A CHILD   Reproductive/Obstetrics                            Anesthesia Physical Anesthesia Plan  ASA: III  Anesthesia Plan: General   Post-op Pain Management:    Induction: Intravenous  PONV Risk Score and Plan: 1 and Ondansetron and Midazolam  Airway Management Planned: Oral ETT  Additional Equipment: Arterial line  Intra-op Plan:   Post-operative Plan: Extubation in OR  Informed Consent: I have reviewed the patients History and  Physical, chart, labs and discussed the procedure including the risks, benefits and alternatives for the proposed anesthesia with the patient or authorized representative who has indicated his/her understanding and acceptance.     Dental advisory given  Plan Discussed with: CRNA and Anesthesiologist  Anesthesia Plan Comments:         Anesthesia Quick Evaluation

## 2018-11-03 NOTE — Pre-Procedure Instructions (Signed)
EKG/ REQUEST FOR CLEARANCE CALLED AND FAXED TO PCP. ALSO TO DR Erlene Quan AND MESSAGED AMY

## 2018-11-03 NOTE — Patient Instructions (Addendum)
Your procedure is scheduled on: Monday, November 17, 2018 Report to Day Surgery on the 2nd floor of the [facility]. To find out your arrival time, please call [phone] between 1PM - 3PM on: Friday, January 24. 2020  REMEMBER: Instructions that are not followed completely may result in serious medical risk, up to and including death; or upon the discretion of your surgeon and anesthesiologist your surgery may need to be rescheduled.  Do not eat food after midnight the night before surgery.  No gum chewing, lozengers or hard candies.  You may however, drink CLEAR liquids up to 2 hours before you are scheduled to arrive for your surgery. Do not drink anything within 2 hours of the start of your surgery.  Clear liquids include: - water  - apple juice without pulp - gatorade - black coffee or tea (Do NOT add milk or creamers to the coffee or tea) Do NOT drink anything that is not on this list.  No Alcohol for 24 hours before or after surgery.  No Smoking including e-cigarettes for 24 hours prior to surgery.  No chewable tobacco products for at least 6 hours prior to surgery.  No nicotine patches on the day of surgery.  On the morning of surgery brush your teeth with toothpaste and water, you may rinse your mouth with mouthwash if you wish. Do not swallow any toothpaste or mouthwash.  Notify your doctor if there is any change in your medical condition (cold, fever, infection).  Do not wear jewelry, make-up, hairpins, clips or nail polish.  Do not wear lotions, powders, or perfumes.   Do not shave 48 hours prior to surgery.   Contacts and dentures may not be worn into surgery.  Do not bring valuables to the hospital, including drivers license, insurance or credit cards.  [facility] is not responsible for any belongings or valuables.   TAKE THESE MEDICATIONS THE MORNING OF SURGERY:  1.  Albuterol inhaler 2.  Amlodipine  Use CHG Soap as directed on instruction  sheet.  Use inhalers on the day of surgery and bring to the hospital.  Starting January 20 - Stop Anti-inflammatories (NSAIDS) such as Advil, Aleve, Ibuprofen, Motrin, Naproxen, Naprosyn and Aspirin based products such as Excedrin, Goodys Powder, BC Powder. (May take Tylenol or Acetaminophen if needed.)  Starting January 20 - Stop ANY OVER THE COUNTER supplements until after surgery. (May continue multivitamin.)  Wear comfortable clothing (specific to your surgery type) to the hospital.  Plan for stool softeners for home use.  If you are being admitted to the hospital overnight, leave your suitcase in the car. After surgery it may be brought to your room.  Please call [phone] if you have any questions about these instructions.

## 2018-11-04 LAB — URINE CULTURE: Culture: NO GROWTH

## 2018-11-04 NOTE — Pre-Procedure Instructions (Signed)
CLEARANCE BY PCP ON CHART.  

## 2018-11-04 NOTE — Pre-Procedure Instructions (Signed)
UA results sent to Dr. Erlene Quan for review.

## 2018-11-05 NOTE — H&P (View-Only) (Signed)
11/06/2018 8:54 AM   Kevin [name] 06-Jul-1933 [id number]  Referring provider: Baxter Hire, MD [place], [place] [address]  Chief Complaint  Patient presents with   Renal Mass    preop    HPI: Kevin [name] is a 85 yo M with a 6.7 right upper pole renal mass as well as a suspicious 1.5 cm left lower pole renal mass returns today for a pre-opt before robotic assisted partial nephrectomy on 11/17/2018.  On 11/17/2018 he is scheduled for a robotic assisted partial nephrectomy vs. Radical nephrectomy.  His CT from 10/30/2018 showed a similar size and appearance of an exophytic interpolar right renal solid mass, consistent with renal cell carcinoma, similar size of a complex left renal lesion was also noted as similar to previous MRI.   Previous history: He underwent CT of the abdomen and pelvis on 05/06/2018 for further evaluation. This showed a large heterogeneous right upper posterior lateral pole lesion measuring 6.7 x 4.7 cm which is relatively exophytic. In addition to this, there is an increased attenuating lesion in the left lower pole measuring 1.5 x 1.3 cm. No adenopathy or hilar involvement was noted. No evidence of metastatic disease in the abdomen or pelvis.   Study was followed up with an MR of the abdomen with and without contrast indicating features which may be consistent with papillary type the exophytic left lower pole lesion was favored to be a Bosniak 2 lesion.  Biopsy of the renal mass c/w renal cell carcinoma, non-clear cell type with somewhat unusual architecture. There is extensive hemorrhage with neoplastic cells are primarily oncocytic with a tubular growth pattern. There was a focus of bland spindle cells with scant clear cytoplasm. Case was discussed with the pathologist, this could represent an indolent type of renal cell carcinoma, however, a more aggressive form cannot be ruled out based on this pathology.  He is also since undergone  CT of the chest without contrast revealing a 5 mm groundglass nodule in the right lower lobe felt to be likely infectious versus inflammatory. An incidental 1.7 cm right thyroid nodule was also identified incidentally.  Biopsy of this nodule was negative.   Creatinine 0.84  PMH: Past Medical History:  Diagnosis Date   Asthma    Broken arm    Pt was 19   Cancer ([place]) 07/2018   RENAL, 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   Cataract cortical, senile    Colon polyps    GERD (gastroesophageal reflux disease)    Hyperlipidemia    Hypertension    Pneumonia    AS A CHILD    Surgical History: Past Surgical History:  Procedure Laterality Date   COLONOSCOPY  2001, 2006, 2012   ESOPHAGOGASTRODUODENOSCOPY  09/11/2000   FLEXIBLE SIGMOIDOSCOPY  03/02/1997   NASAL POLYP EXCISION      Home Medications:  Allergies as of 11/06/2018   No Known Allergies     Medication List       Accurate as of November 06, 2018 11:59 PM. Always use your most recent med list.        amLODipine 5 MG tablet Commonly known as:  NORVASC Take 5 mg by mouth daily.   hydrochlorothiazide 25 MG tablet Commonly known as:  HYDRODIURIL Take 25 mg by mouth daily.   PRESERVISION AREDS 2 PO Take 1 tablet by mouth daily.   PROVENTIL HFA 108 (90 Base) MCG/ACT inhaler Generic drug:  albuterol Inhale 2 puffs into the lungs every 6 (six) hours  as needed for wheezing or shortness of breath.   simvastatin 10 MG tablet Commonly known as:  ZOCOR Take 10 mg by mouth daily.       Allergies: No Known Allergies  Family History: Family History  Problem Relation Age of Onset   Heart disease Mother    Pancreatitis Mother    Emphysema Father    Breast cancer Sister    Breast cancer Sister     Social History:  reports that he has never smoked. He has never used smokeless tobacco. He reports that he does not drink alcohol or use drugs.  ROS: UROLOGY Frequent Urination?: No Hard to postpone urination?:  Yes Burning/pain with urination?: No Get up at night to urinate?: Yes Leakage of urine?: Yes Urine stream starts and stops?: Yes Trouble starting stream?: No Do you have to strain to urinate?: No Blood in urine?: No Urinary tract infection?: No Sexually transmitted disease?: No Injury to kidneys or bladder?: No Painful intercourse?: No Weak stream?: No Erection problems?: No Penile pain?: No  Gastrointestinal Nausea?: No Vomiting?: No Indigestion/heartburn?: No Diarrhea?: No Constipation?: Yes  Constitutional Fever: No Night sweats?: No Weight loss?: No Fatigue?: No  Skin Skin rash/lesions?: No Itching?: No  Eyes Blurred vision?: No Double vision?: No  Ears/Nose/Throat Sore throat?: No Sinus problems?: No  Hematologic/Lymphatic Swollen glands?: No Easy bruising?: No  Cardiovascular Leg swelling?: No Chest pain?: No  Respiratory Cough?: No Shortness of breath?: No  Endocrine Excessive thirst?: No  Musculoskeletal Back pain?: No Joint pain?: No  Neurological Headaches?: No Dizziness?: No  Psychologic Depression?: No Anxiety?: No  Physical Exam: BP 123/74 (BP Location: Left Arm, Patient Position: Sitting)    Pulse 85    Ht 6' (1.829 m)    Wt 178 lb 9.6 oz (81 kg)    BMI 24.22 kg/m   Constitutional:  Alert and oriented, No acute distress. He is accompanied by his wife.  HEENT: [place] AT, moist mucus membranes.  Trachea midline, no masses. Cardiovascular: No clubbing, cyanosis, or edema. Respiratory: Normal respiratory effort, no increased work of breathing. Skin: No rashes, bruises or suspicious lesions. Neurologic: Grossly intact, no focal deficits, moving all 4 extremities. Psychiatric: Normal mood and affect.  Laboratory Data: Lab Results  Component Value Date   WBC 4.5 11/03/2018   HGB 14.0 11/03/2018   HCT 40.7 11/03/2018   MCV 90.2 11/03/2018   PLT 180 11/03/2018    Lab Results  Component Value Date   CREATININE 0.84 11/03/2018     Urinalysis    Component Value Date/Time   COLORURINE YELLOW (A) 11/03/2018 0840   APPEARANCEUR CLEAR (A) 11/03/2018 0840   LABSPEC 1.013 11/03/2018 0840   PHURINE 6.0 11/03/2018 0840   GLUCOSEU NEGATIVE 11/03/2018 0840   HGBUR SMALL (A) 11/03/2018 0840   BILIRUBINUR NEGATIVE 11/03/2018 0840   KETONESUR NEGATIVE 11/03/2018 0840   PROTEINUR NEGATIVE 11/03/2018 0840   NITRITE NEGATIVE 11/03/2018 0840   LEUKOCYTESUR NEGATIVE 11/03/2018 0840    Lab Results  Component Value Date   BACTERIA NONE SEEN 11/03/2018    Pertinent Imaging: CLINICAL DATA:  Bilateral renal lesions. Preop planning for partial right nephrectomy.  EXAM: CT ABDOMEN WITH CONTRAST  TECHNIQUE: Multidetector CT imaging of the abdomen was performed using the standard protocol following bolus administration of intravenous contrast.  CONTRAST:  175mL ISOVUE-300 IOPAMIDOL (ISOVUE-300) INJECTION 61%  COMPARISON:  05/26/2018 abdominal MRI.  FINDINGS: Lower chest: Bibasilar scarring. Normal heart size without pericardial or pleural effusion. Tiny hiatal hernia.  Hepatobiliary: Hepatic  cysts. Normal gallbladder, without biliary ductal dilatation.  Pancreas: Normal, without mass or ductal dilatation.  Spleen: Normal in size, without focal abnormality.  Adrenals/Urinary Tract: Normal adrenal glands.  Posterior interpolar left renal 3.1 cm cyst or minimally complex cyst.  Complex lateral lower pole left renal lesion measures maximally 1.5 cm on image 37/2 and is similar in size to 05/06/2018.  Right renal lesions which are consistent with cysts or minimally complex cysts on the prior exam.  Primarily exophytic lateral interpolar right renal lesion measures 6.4 x 4.6 cm on image 25/2. Similar to 6.7 x 4.7 cm on 05/06/2018. 5.2 cm craniocaudal. This again contacts the right lobe of the liver, including image 23/2. No gross invasion.  No hydronephrosis.  Stomach/Bowel: Proximal  gastric underdistention. Scattered colonic diverticula. Normal abdominal terminal ileum and appendix. Large periampullary duodenal diverticulum. Otherwise normal small bowel.  Vascular/Lymphatic: Ectasia of the celiac is similar. Aortic and branch vessel atherosclerosis. Patent renal veins. No retroperitoneal or retrocrural adenopathy.  Other: No ascites.  Musculoskeletal: Convex left lumbar spine curvature. Degenerative disc disease at L4-5.  IMPRESSION: 1. Similar size and appearance of an exophytic interpolar right renal solid mass, consistent with renal cell carcinoma. No right renal vein involvement or abdominal adenopathy. 2. Similar size of a complex left renal lesion which was detailed on prior MRI. 3.  Aortic and branch vessel atherosclerosis. 4.  Tiny hiatal hernia.   Electronically Signed   By: Abigail Miyamoto M.D.   On: 10/30/2018 09:07  I have reviewed the CT independently and with pt.   Assessment & Plan:    1. Right renal mass Previously discussed options at length, see previous note for details He ultimately has elected to pursue right robotic partial nephrectomy with low threshold to convert to radical nephrectomy as deemed necessary intraoperatively given the size of the lesion Risk and benefits were reviewed again in detail He is agreeable to this plan  CT from 10/30/2018 showed a similar size and appearance of an exophytic interpolar right renal solid mass, consistent with renal cell carcinoma, similar size of a complex left renal lesion was also noted as similar to previous MRI  2. Left renal mass Recommend surveillance at this time with consideration of ablative therapyif progresses  I, Nethusan Sivanesan, am acting as a scribe for Dr. Hollice Espy,  I have reviewed the above documentation for accuracy and completeness, and I agree with the above.   Hollice Espy, MD  [facility] [address], [place], [place] [address] [phone]  I spent 15 min with this patient of which greater than 50% was spent in counseling and coordination of care with the patient.

## 2018-11-05 NOTE — Progress Notes (Signed)
11/06/2018 8:54 AM   Kevin [name] Kevin [name] 06/08/1933 [id number]  Referring provider: Baxter Hire, MD [place], [place] [address]  Chief Complaint  Patient presents with   Renal Mass    preop    HPI: Kevin [name] is a 85 yo M with a 6.7 right upper pole renal mass as well as a suspicious 1.5 cm left lower pole renal mass returns today for a pre-opt before robotic assisted partial nephrectomy on 11/17/2018.  On 11/17/2018 he is scheduled for a robotic assisted partial nephrectomy vs. Radical nephrectomy.  His CT from 10/30/2018 showed a similar size and appearance of an exophytic interpolar right renal solid mass, consistent with renal cell carcinoma, similar size of a complex left renal lesion was also noted as similar to previous MRI.   Previous history: He underwent CT of the abdomen and pelvis on 05/06/2018 for further evaluation. This showed a large heterogeneous right upper posterior lateral pole lesion measuring 6.7 x 4.7 cm which is relatively exophytic. In addition to this, there is an increased attenuating lesion in the left lower pole measuring 1.5 x 1.3 cm. No adenopathy or hilar involvement was noted. No evidence of metastatic disease in the abdomen or pelvis.   Study was followed up with an MR of the abdomen with and without contrast indicating features which may be consistent with papillary type the exophytic left lower pole lesion was favored to be a Bosniak 2 lesion.  Biopsy of the renal mass c/w renal cell carcinoma, non-clear cell type with somewhat unusual architecture. There is extensive hemorrhage with neoplastic cells are primarily oncocytic with a tubular growth pattern. There was a focus of bland spindle cells with scant clear cytoplasm. Case was discussed with the pathologist, this could represent an indolent type of renal cell carcinoma, however, a more aggressive form cannot be ruled out based on this pathology.  He is also since undergone  CT of the chest without contrast revealing a 5 mm groundglass nodule in the right lower lobe felt to be likely infectious versus inflammatory. An incidental 1.7 cm right thyroid nodule was also identified incidentally.  Biopsy of this nodule was negative.   Creatinine 0.84  PMH: Past Medical History:  Diagnosis Date   Asthma    Broken arm    Pt was 19   Cancer ([place]) 07/2018   RENAL, 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   Cataract cortical, senile    Colon polyps    GERD (gastroesophageal reflux disease)    Hyperlipidemia    Hypertension    Pneumonia    AS A CHILD    Surgical History: Past Surgical History:  Procedure Laterality Date   COLONOSCOPY  2001, 2006, 2012   ESOPHAGOGASTRODUODENOSCOPY  09/11/2000   FLEXIBLE SIGMOIDOSCOPY  03/02/1997   NASAL POLYP EXCISION      Home Medications:  Allergies as of 11/06/2018   No Known Allergies     Medication List       Accurate as of November 06, 2018 11:59 PM. Always use your most recent med list.        amLODipine 5 MG tablet Commonly known as:  NORVASC Take 5 mg by mouth daily.   hydrochlorothiazide 25 MG tablet Commonly known as:  HYDRODIURIL Take 25 mg by mouth daily.   PRESERVISION AREDS 2 PO Take 1 tablet by mouth daily.   PROVENTIL HFA 108 (90 Base) MCG/ACT inhaler Generic drug:  albuterol Inhale 2 puffs into the lungs every 6 (six) hours  as needed for wheezing or shortness of breath.   simvastatin 10 MG tablet Commonly known as:  ZOCOR Take 10 mg by mouth daily.       Allergies: No Known Allergies  Family History: Family History  Problem Relation Age of Onset   Heart disease Mother    Pancreatitis Mother    Emphysema Father    Breast cancer Sister    Breast cancer Sister     Social History:  reports that he has never smoked. He has never used smokeless tobacco. He reports that he does not drink alcohol or use drugs.  ROS: UROLOGY Frequent Urination?: No Hard to postpone urination?:  Yes Burning/pain with urination?: No Get up at night to urinate?: Yes Leakage of urine?: Yes Urine stream starts and stops?: Yes Trouble starting stream?: No Do you have to strain to urinate?: No Blood in urine?: No Urinary tract infection?: No Sexually transmitted disease?: No Injury to kidneys or bladder?: No Painful intercourse?: No Weak stream?: No Erection problems?: No Penile pain?: No  Gastrointestinal Nausea?: No Vomiting?: No Indigestion/heartburn?: No Diarrhea?: No Constipation?: Yes  Constitutional Fever: No Night sweats?: No Weight loss?: No Fatigue?: No  Skin Skin rash/lesions?: No Itching?: No  Eyes Blurred vision?: No Double vision?: No  Ears/Nose/Throat Sore throat?: No Sinus problems?: No  Hematologic/Lymphatic Swollen glands?: No Easy bruising?: No  Cardiovascular Leg swelling?: No Chest pain?: No  Respiratory Cough?: No Shortness of breath?: No  Endocrine Excessive thirst?: No  Musculoskeletal Back pain?: No Joint pain?: No  Neurological Headaches?: No Dizziness?: No  Psychologic Depression?: No Anxiety?: No  Physical Exam: BP 123/74 (BP Location: Left Arm, Patient Position: Sitting)    Pulse 85    Ht 6' (1.829 m)    Wt 178 lb 9.6 oz (81 kg)    BMI 24.22 kg/m   Constitutional:  Alert and oriented, No acute distress. He is accompanied by his wife.  HEENT: [place] AT, moist mucus membranes.  Trachea midline, no masses. Cardiovascular: No clubbing, cyanosis, or edema. Respiratory: Normal respiratory effort, no increased work of breathing. Skin: No rashes, bruises or suspicious lesions. Neurologic: Grossly intact, no focal deficits, moving all 4 extremities. Psychiatric: Normal mood and affect.  Laboratory Data: Lab Results  Component Value Date   WBC 4.5 11/03/2018   HGB 14.0 11/03/2018   HCT 40.7 11/03/2018   MCV 90.2 11/03/2018   PLT 180 11/03/2018    Lab Results  Component Value Date   CREATININE 0.84 11/03/2018     Urinalysis    Component Value Date/Time   COLORURINE YELLOW (A) 11/03/2018 0840   APPEARANCEUR CLEAR (A) 11/03/2018 0840   LABSPEC 1.013 11/03/2018 0840   PHURINE 6.0 11/03/2018 0840   GLUCOSEU NEGATIVE 11/03/2018 0840   HGBUR SMALL (A) 11/03/2018 0840   BILIRUBINUR NEGATIVE 11/03/2018 0840   KETONESUR NEGATIVE 11/03/2018 0840   PROTEINUR NEGATIVE 11/03/2018 0840   NITRITE NEGATIVE 11/03/2018 0840   LEUKOCYTESUR NEGATIVE 11/03/2018 0840    Lab Results  Component Value Date   BACTERIA NONE SEEN 11/03/2018    Pertinent Imaging: CLINICAL DATA:  Bilateral renal lesions. Preop planning for partial right nephrectomy.  EXAM: CT ABDOMEN WITH CONTRAST  TECHNIQUE: Multidetector CT imaging of the abdomen was performed using the standard protocol following bolus administration of intravenous contrast.  CONTRAST:  158mL ISOVUE-300 IOPAMIDOL (ISOVUE-300) INJECTION 61%  COMPARISON:  05/26/2018 abdominal MRI.  FINDINGS: Lower chest: Bibasilar scarring. Normal heart size without pericardial or pleural effusion. Tiny hiatal hernia.  Hepatobiliary: Hepatic  cysts. Normal gallbladder, without biliary ductal dilatation.  Pancreas: Normal, without mass or ductal dilatation.  Spleen: Normal in size, without focal abnormality.  Adrenals/Urinary Tract: Normal adrenal glands.  Posterior interpolar left renal 3.1 cm cyst or minimally complex cyst.  Complex lateral lower pole left renal lesion measures maximally 1.5 cm on image 37/2 and is similar in size to 05/06/2018.  Right renal lesions which are consistent with cysts or minimally complex cysts on the prior exam.  Primarily exophytic lateral interpolar right renal lesion measures 6.4 x 4.6 cm on image 25/2. Similar to 6.7 x 4.7 cm on 05/06/2018. 5.2 cm craniocaudal. This again contacts the right lobe of the liver, including image 23/2. No gross invasion.  No hydronephrosis.  Stomach/Bowel: Proximal  gastric underdistention. Scattered colonic diverticula. Normal abdominal terminal ileum and appendix. Large periampullary duodenal diverticulum. Otherwise normal small bowel.  Vascular/Lymphatic: Ectasia of the celiac is similar. Aortic and branch vessel atherosclerosis. Patent renal veins. No retroperitoneal or retrocrural adenopathy.  Other: No ascites.  Musculoskeletal: Convex left lumbar spine curvature. Degenerative disc disease at L4-5.  IMPRESSION: 1. Similar size and appearance of an exophytic interpolar right renal solid mass, consistent with renal cell carcinoma. No right renal vein involvement or abdominal adenopathy. 2. Similar size of a complex left renal lesion which was detailed on prior MRI. 3.  Aortic and branch vessel atherosclerosis. 4.  Tiny hiatal hernia.   Electronically Signed   By: Abigail Miyamoto M.D.   On: 10/30/2018 09:07  I have reviewed the CT independently and with pt.   Assessment & Plan:    1. Right renal mass Previously discussed options at length, see previous note for details He ultimately has elected to pursue right robotic partial nephrectomy with low threshold to convert to radical nephrectomy as deemed necessary intraoperatively given the size of the lesion Risk and benefits were reviewed again in detail He is agreeable to this plan  CT from 10/30/2018 showed a similar size and appearance of an exophytic interpolar right renal solid mass, consistent with renal cell carcinoma, similar size of a complex left renal lesion was also noted as similar to previous MRI  2. Left renal mass Recommend surveillance at this time with consideration of ablative therapyif progresses  I, Nethusan Sivanesan, am acting as a scribe for Dr. Hollice Espy,  I have reviewed the above documentation for accuracy and completeness, and I agree with the above.   Hollice Espy, MD  [facility] [address] [place], [place] [address] [phone]  I spent 15 min with this patient of which greater than 50% was spent in counseling and coordination of care with the patient.

## 2018-11-06 ENCOUNTER — Ambulatory Visit: Payer: Medicare HMO | Admitting: Urology

## 2018-11-06 ENCOUNTER — Encounter: Payer: Self-pay | Admitting: Urology

## 2018-11-06 VITALS — BP 123/74 | HR 85 | Ht 72.0 in | Wt 178.6 lb

## 2018-11-06 DIAGNOSIS — N2889 Other specified disorders of kidney and ureter: Secondary | ICD-10-CM

## 2018-11-14 LAB — PREPARE RBC (CROSSMATCH)

## 2018-11-16 MED ORDER — CLINDAMYCIN PHOSPHATE 900 MG/50ML IV SOLN
900.0000 mg | INTRAVENOUS | Status: AC
  Administered 2018-11-17: 900 mg via INTRAVENOUS

## 2018-11-17 ENCOUNTER — Inpatient Hospital Stay: Payer: Medicare HMO | Admitting: Anesthesiology

## 2018-11-17 ENCOUNTER — Inpatient Hospital Stay
Admission: RE | Admit: 2018-11-17 | Discharge: 2018-11-18 | DRG: 658 | Disposition: A | Discharge status: Home / Self Care | Payer: Medicare HMO | Attending: Urology | Admitting: Urology

## 2018-11-17 ENCOUNTER — Encounter
Admission: RE | Disposition: A | Discharge status: Home / Self Care | Payer: Self-pay | Source: Home / Self Care | Attending: Urology

## 2018-11-17 ENCOUNTER — Other Ambulatory Visit: Payer: Self-pay

## 2018-11-17 ENCOUNTER — Encounter: Payer: Self-pay | Admitting: *Deleted

## 2018-11-17 DIAGNOSIS — I10 Essential (primary) hypertension: Secondary | ICD-10-CM | POA: Diagnosis present

## 2018-11-17 DIAGNOSIS — Z8249 Family history of ischemic heart disease and other diseases of the circulatory system: Secondary | ICD-10-CM | POA: Diagnosis not present

## 2018-11-17 DIAGNOSIS — Z85828 Personal history of other malignant neoplasm of skin: Secondary | ICD-10-CM

## 2018-11-17 DIAGNOSIS — N2889 Other specified disorders of kidney and ureter: Secondary | ICD-10-CM | POA: Diagnosis present

## 2018-11-17 DIAGNOSIS — K7689 Other specified diseases of liver: Secondary | ICD-10-CM | POA: Diagnosis present

## 2018-11-17 DIAGNOSIS — D4101 Neoplasm of uncertain behavior of right kidney: Secondary | ICD-10-CM | POA: Diagnosis not present

## 2018-11-17 DIAGNOSIS — K219 Gastro-esophageal reflux disease without esophagitis: Secondary | ICD-10-CM | POA: Diagnosis present

## 2018-11-17 DIAGNOSIS — E785 Hyperlipidemia, unspecified: Secondary | ICD-10-CM | POA: Diagnosis present

## 2018-11-17 DIAGNOSIS — C641 Malignant neoplasm of right kidney, except renal pelvis: Secondary | ICD-10-CM | POA: Diagnosis present

## 2018-11-17 HISTORY — PX: ROBOT ASSISTED LAPAROSCOPIC NEPHRECTOMY: SHX5140

## 2018-11-17 LAB — ABO/RH: ABO/RH(D): O POS

## 2018-11-17 SURGERY — ROBOTIC ASSISTED LAPAROSCOPIC NEPHRECTOMY
Anesthesia: General | Laterality: Right

## 2018-11-17 MED ORDER — DIPHENHYDRAMINE HCL 12.5 MG/5ML PO ELIX
12.5000 mg | ORAL_SOLUTION | Freq: Four times a day (QID) | ORAL | Status: DC | PRN
  Filled 2018-11-17: qty 5

## 2018-11-17 MED ORDER — SIMVASTATIN 10 MG PO TABS
10.0000 mg | ORAL_TABLET | Freq: Every day | ORAL | Status: DC
  Filled 2018-11-17: qty 1

## 2018-11-17 MED ORDER — ACETAMINOPHEN 325 MG PO TABS: 650.0000 mg | ORAL_TABLET | ORAL | Status: DC | PRN

## 2018-11-17 MED ORDER — ONDANSETRON HCL 4 MG/2ML IJ SOLN: 4.0000 mg | INTRAMUSCULAR | Status: DC | PRN

## 2018-11-17 MED ORDER — FAMOTIDINE 20 MG PO TABS
20.0000 mg | ORAL_TABLET | Freq: Once | ORAL | Status: AC
  Administered 2018-11-17: 20 mg via ORAL

## 2018-11-17 MED ORDER — ACETAMINOPHEN 10 MG/ML IV SOLN
INTRAVENOUS | Status: DC | PRN
  Administered 2018-11-17: 1000 mg via INTRAVENOUS

## 2018-11-17 MED ORDER — THROMBIN 5000 UNITS EX SOLR
CUTANEOUS | Status: DC | PRN
  Administered 2018-11-17: 5000 [IU] via TOPICAL

## 2018-11-17 MED ORDER — LACTATED RINGERS IV SOLN
INTRAVENOUS | Status: DC | PRN
  Administered 2018-11-17 (×2): via INTRAVENOUS

## 2018-11-17 MED ORDER — HEPARIN SODIUM (PORCINE) 5000 UNIT/ML IJ SOLN
5000.0000 [IU] | Freq: Three times a day (TID) | INTRAMUSCULAR | Status: DC
  Administered 2018-11-17 – 2018-11-18 (×4): 5000 [IU] via SUBCUTANEOUS
  Filled 2018-11-17 (×4): qty 1

## 2018-11-17 MED ORDER — ALBUTEROL SULFATE HFA 108 (90 BASE) MCG/ACT IN AERS: 2.0000 | INHALATION_SPRAY | Freq: Four times a day (QID) | RESPIRATORY_TRACT | Status: DC | PRN

## 2018-11-17 MED ORDER — OXYCODONE-ACETAMINOPHEN 5-325 MG PO TABS
1.0000 | ORAL_TABLET | ORAL | Status: DC | PRN
  Administered 2018-11-17 (×2): 1 via ORAL
  Administered 2018-11-18 (×2): 2 via ORAL
  Filled 2018-11-17 (×2): qty 2
  Filled 2018-11-17 (×2): qty 1

## 2018-11-17 MED ORDER — THROMBIN 5000 UNITS EX SOLR
CUTANEOUS | Status: AC
  Filled 2018-11-17: qty 5000

## 2018-11-17 MED ORDER — BUPIVACAINE LIPOSOME 1.3 % IJ SUSP
INTRAMUSCULAR | Status: AC
  Filled 2018-11-17: qty 20

## 2018-11-17 MED ORDER — SUGAMMADEX SODIUM 200 MG/2ML IV SOLN
INTRAVENOUS | Status: DC | PRN
  Administered 2018-11-17: 160 mg via INTRAVENOUS

## 2018-11-17 MED ORDER — ALBUTEROL SULFATE (2.5 MG/3ML) 0.083% IN NEBU: 2.5000 mg | INHALATION_SOLUTION | Freq: Four times a day (QID) | RESPIRATORY_TRACT | Status: DC | PRN

## 2018-11-17 MED ORDER — FENTANYL CITRATE (PF) 100 MCG/2ML IJ SOLN
INTRAMUSCULAR | Status: AC
  Administered 2018-11-17: 25 ug via INTRAVENOUS
  Filled 2018-11-17: qty 2

## 2018-11-17 MED ORDER — FENTANYL CITRATE (PF) 250 MCG/5ML IJ SOLN
INTRAMUSCULAR | Status: AC
  Filled 2018-11-17: qty 5

## 2018-11-17 MED ORDER — MEPERIDINE HCL 50 MG/ML IJ SOLN: 6.2500 mg | INTRAMUSCULAR | Status: DC | PRN

## 2018-11-17 MED ORDER — MORPHINE SULFATE (PF) 2 MG/ML IV SOLN: 2.0000 mg | INTRAVENOUS | Status: DC | PRN

## 2018-11-17 MED ORDER — CLINDAMYCIN PHOSPHATE 900 MG/50ML IV SOLN
INTRAVENOUS | Status: AC
  Filled 2018-11-17: qty 50

## 2018-11-17 MED ORDER — MANNITOL 25 % IV SOLN
INTRAVENOUS | Status: AC
  Filled 2018-11-17: qty 100

## 2018-11-17 MED ORDER — BUPIVACAINE HCL (PF) 0.5 % IJ SOLN
INTRAMUSCULAR | Status: AC
  Filled 2018-11-17: qty 30

## 2018-11-17 MED ORDER — ONDANSETRON HCL 4 MG/2ML IJ SOLN
INTRAMUSCULAR | Status: DC | PRN
  Administered 2018-11-17 (×2): 4 mg via INTRAVENOUS

## 2018-11-17 MED ORDER — AMLODIPINE BESYLATE 5 MG PO TABS
5.0000 mg | ORAL_TABLET | Freq: Every day | ORAL | Status: DC
  Administered 2018-11-18: 5 mg via ORAL
  Filled 2018-11-17 (×2): qty 1

## 2018-11-17 MED ORDER — MANNITOL 25 % IV SOLN
INTRAVENOUS | Status: AC
  Filled 2018-11-17: qty 50

## 2018-11-17 MED ORDER — SIMVASTATIN 10 MG PO TABS
10.0000 mg | ORAL_TABLET | Freq: Every day | ORAL | Status: DC
  Administered 2018-11-17: 10 mg via ORAL
  Filled 2018-11-17 (×2): qty 1

## 2018-11-17 MED ORDER — DIPHENHYDRAMINE HCL 50 MG/ML IJ SOLN: 12.5000 mg | Freq: Four times a day (QID) | INTRAMUSCULAR | Status: DC | PRN

## 2018-11-17 MED ORDER — OXYCODONE HCL 5 MG PO TABS: 5.0000 mg | ORAL_TABLET | Freq: Once | ORAL | Status: DC | PRN

## 2018-11-17 MED ORDER — SODIUM CHLORIDE 0.9 % IV SOLN
INTRAVENOUS | Status: DC
  Administered 2018-11-17: 07:00:00 via INTRAVENOUS

## 2018-11-17 MED ORDER — FENTANYL CITRATE (PF) 100 MCG/2ML IJ SOLN
INTRAMUSCULAR | Status: DC | PRN
  Administered 2018-11-17 (×4): 50 ug via INTRAVENOUS

## 2018-11-17 MED ORDER — SODIUM CHLORIDE 0.9 % IV SOLN
INTRAVENOUS | Status: DC | PRN
  Administered 2018-11-17: 30 ug/min via INTRAVENOUS
  Administered 2018-11-17: 50 ug/min via INTRAVENOUS

## 2018-11-17 MED ORDER — LIDOCAINE HCL (CARDIAC) PF 100 MG/5ML IV SOSY
PREFILLED_SYRINGE | INTRAVENOUS | Status: DC | PRN
  Administered 2018-11-17: 100 mg via INTRAVENOUS

## 2018-11-17 MED ORDER — PROPOFOL 10 MG/ML IV BOLUS
INTRAVENOUS | Status: DC | PRN
  Administered 2018-11-17: 120 mg via INTRAVENOUS

## 2018-11-17 MED ORDER — FENTANYL CITRATE (PF) 100 MCG/2ML IJ SOLN
25.0000 ug | INTRAMUSCULAR | Status: DC | PRN
  Administered 2018-11-17 (×4): 25 ug via INTRAVENOUS

## 2018-11-17 MED ORDER — BUPIVACAINE LIPOSOME 1.3 % IJ SUSP
INTRAMUSCULAR | Status: DC | PRN
  Administered 2018-11-17: 50 mL

## 2018-11-17 MED ORDER — SODIUM CHLORIDE 0.9 % IV SOLN
INTRAVENOUS | Status: DC
  Administered 2018-11-17 (×2): via INTRAVENOUS

## 2018-11-17 MED ORDER — HYDROCHLOROTHIAZIDE 25 MG PO TABS
25.0000 mg | ORAL_TABLET | Freq: Every day | ORAL | Status: DC
  Administered 2018-11-18: 25 mg via ORAL
  Filled 2018-11-17: qty 1

## 2018-11-17 MED ORDER — PROPOFOL 10 MG/ML IV BOLUS
INTRAVENOUS | Status: AC
  Filled 2018-11-17: qty 20

## 2018-11-17 MED ORDER — FAMOTIDINE 20 MG PO TABS
ORAL_TABLET | ORAL | Status: AC
  Administered 2018-11-17: 20 mg via ORAL
  Filled 2018-11-17: qty 1

## 2018-11-17 MED ORDER — CEFAZOLIN SODIUM-DEXTROSE 1-4 GM/50ML-% IV SOLN
1.0000 g | Freq: Three times a day (TID) | INTRAVENOUS | Status: AC
  Administered 2018-11-17 (×2): 1 g via INTRAVENOUS
  Filled 2018-11-17 (×2): qty 50

## 2018-11-17 MED ORDER — PROMETHAZINE HCL 25 MG/ML IJ SOLN: 6.2500 mg | INTRAMUSCULAR | Status: DC | PRN

## 2018-11-17 MED ORDER — SUCCINYLCHOLINE CHLORIDE 20 MG/ML IJ SOLN
INTRAMUSCULAR | Status: DC | PRN
  Administered 2018-11-17: 100 mg via INTRAVENOUS

## 2018-11-17 MED ORDER — ROCURONIUM BROMIDE 100 MG/10ML IV SOLN
INTRAVENOUS | Status: DC | PRN
  Administered 2018-11-17: 10 mg via INTRAVENOUS
  Administered 2018-11-17: 20 mg via INTRAVENOUS
  Administered 2018-11-17: 10 mg via INTRAVENOUS
  Administered 2018-11-17: 20 mg via INTRAVENOUS
  Administered 2018-11-17: 40 mg via INTRAVENOUS

## 2018-11-17 MED ORDER — OXYCODONE HCL 5 MG/5ML PO SOLN: 5.0000 mg | Freq: Once | ORAL | Status: DC | PRN

## 2018-11-17 MED ORDER — OXYBUTYNIN CHLORIDE 5 MG PO TABS: 5.0000 mg | ORAL_TABLET | Freq: Three times a day (TID) | ORAL | Status: DC | PRN

## 2018-11-17 MED ORDER — DEXAMETHASONE SODIUM PHOSPHATE 10 MG/ML IJ SOLN
INTRAMUSCULAR | Status: DC | PRN
  Administered 2018-11-17: 10 mg via INTRAVENOUS

## 2018-11-17 MED ORDER — BELLADONNA ALKALOIDS-OPIUM 16.2-60 MG RE SUPP: 1.0000 | Freq: Four times a day (QID) | RECTAL | Status: DC | PRN

## 2018-11-17 MED ORDER — DOCUSATE SODIUM 100 MG PO CAPS
100.0000 mg | ORAL_CAPSULE | Freq: Two times a day (BID) | ORAL | Status: DC
  Administered 2018-11-17 – 2018-11-18 (×3): 100 mg via ORAL
  Filled 2018-11-17 (×3): qty 1

## 2018-11-17 SURGICAL SUPPLY — 109 items
ANCHOR TIS RET SYS 1550ML (BAG) ×3 IMPLANT
ANCHOR TIS RET SYS 235ML (MISCELLANEOUS) IMPLANT
APPLICATOR SURGIFLO ENDO (HEMOSTASIS) ×3 IMPLANT
APPLIER CLIP LOGIC TI 5 (MISCELLANEOUS) IMPLANT
BNDG COHESIVE 4X5 TAN STRL (GAUZE/BANDAGES/DRESSINGS) IMPLANT
BULB RESERV EVAC DRAIN JP 100C (MISCELLANEOUS) IMPLANT
CANISTER SUCT 1200ML W/VALVE (MISCELLANEOUS) ×3 IMPLANT
CANNULA SEAL DVNC (CANNULA) ×1 IMPLANT
CANNULA SEALS DA VINCI (CANNULA) ×2
CHLORAPREP W/TINT 26ML (MISCELLANEOUS) ×6 IMPLANT
CLIP SUT LAPRA TY ABSORB (SUTURE) ×6 IMPLANT
CLIP VESOLOCK LG 6/CT PURPLE (CLIP) ×6 IMPLANT
CLIP VESOLOCK MED LG 6/CT (CLIP) IMPLANT
CORD BIP STRL DISP 12FT (MISCELLANEOUS) ×3 IMPLANT
CORD MONOPOLAR M/FML 12FT (MISCELLANEOUS) ×3 IMPLANT
COVER LIGHT HANDLE STERIS (MISCELLANEOUS) ×3 IMPLANT
COVER TIP SHEARS 8 DVNC (MISCELLANEOUS) ×1 IMPLANT
COVER TIP SHEARS 8MM DA VINCI (MISCELLANEOUS) ×2
COVER WAND RF STERILE (DRAPES) IMPLANT
CUTTER ECHEON FLEX ENDO 45 340 (ENDOMECHANICALS) IMPLANT
DEFOGGER SCOPE WARMER CLEARIFY (MISCELLANEOUS) ×3 IMPLANT
DERMABOND ADVANCED (GAUZE/BANDAGES/DRESSINGS) ×2
DERMABOND ADVANCED .7 DNX12 (GAUZE/BANDAGES/DRESSINGS) ×1 IMPLANT
DRAIN CHANNEL JP 19F (MISCELLANEOUS) IMPLANT
DRAPE SHEET LG 3/4 BI-LAMINATE (DRAPES) ×3 IMPLANT
DRAPE SURG 17X11 SM STRL (DRAPES) ×12 IMPLANT
DRIVER LRG NEEDLE DA VINCI (INSTRUMENTS) ×4
DRIVER NDLE LRG DVNC (INSTRUMENTS) ×2 IMPLANT
ELECT REM PT RETURN 9FT ADLT (ELECTROSURGICAL) ×3
ELECTRODE REM PT RTRN 9FT ADLT (ELECTROSURGICAL) ×1 IMPLANT
GLOVE BIO SURGEON STRL SZ 6.5 (GLOVE) ×6 IMPLANT
GLOVE BIO SURGEONS STRL SZ 6.5 (GLOVE) ×3
GLOVE BIOGEL PI IND STRL 6.5 (GLOVE) ×2 IMPLANT
GLOVE BIOGEL PI INDICATOR 6.5 (GLOVE) ×4
GOWN STRL REUS W/ TWL LRG LVL3 (GOWN DISPOSABLE) ×6 IMPLANT
GOWN STRL REUS W/TWL LRG LVL3 (GOWN DISPOSABLE) ×12
GOWN STRL REUS W/TWL XL LVL4 (GOWN DISPOSABLE) ×3 IMPLANT
GRASPER DBL FENESTRATED (INSTRUMENTS) ×2
GRASPER DBL FENESTRATED DVNC (INSTRUMENTS) ×1 IMPLANT
GRASPER SUT TROCAR 14GX15 (MISCELLANEOUS) ×3 IMPLANT
HEMOSTAT SURGICEL 2X14 (HEMOSTASIS) ×3 IMPLANT
HOLDER FOLEY CATH W/STRAP (MISCELLANEOUS) ×3 IMPLANT
IRRIGATION STRYKERFLOW (MISCELLANEOUS) ×1 IMPLANT
IRRIGATOR STRYKERFLOW (MISCELLANEOUS) ×3
IV NS 1000ML (IV SOLUTION) ×4
IV NS 1000ML BAXH (IV SOLUTION) ×2 IMPLANT
KIT ACCESSORY DA VINCI DISP (KITS) ×2
KIT ACCESSORY DVNC DISP (KITS) ×1 IMPLANT
KIT PINK PAD W/HEAD ARE REST (MISCELLANEOUS) ×3
KIT PINK PAD W/HEAD ARM REST (MISCELLANEOUS) ×1 IMPLANT
KIT TURNOVER CYSTO (KITS) ×3 IMPLANT
KIT TURNOVER KIT A (KITS) ×3 IMPLANT
LABEL OR SOLS (LABEL) ×3 IMPLANT
LOOP RED MAXI  1X406MM (MISCELLANEOUS) ×2
LOOP VESSEL MAXI 1X406 RED (MISCELLANEOUS) ×1 IMPLANT
NEEDLE HYPO 22GX1.5 SAFETY (NEEDLE) ×3 IMPLANT
NEEDLE HYPO 25X1 1.5 SAFETY (NEEDLE) ×3 IMPLANT
NEEDLE INSUFFLATION 14GA 120MM (NEEDLE) ×3 IMPLANT
NS IRRIG 500ML POUR BTL (IV SOLUTION) ×3 IMPLANT
PACK LAP CHOLECYSTECTOMY (MISCELLANEOUS) ×3 IMPLANT
PENCIL ELECTRO HAND CTR (MISCELLANEOUS) ×3 IMPLANT
PROBE ULTRASOUND PROART (MISCELLANEOUS) IMPLANT
PROGRASP ENDOWRIST DA VINCI (INSTRUMENTS) ×2
PROGRASP ENDOWRIST DVNC (INSTRUMENTS) ×1 IMPLANT
RELOAD STAPLER WHITE 60MM (STAPLE) ×3 IMPLANT
RELOAD WH ECHELON 45 (STAPLE) IMPLANT
RETRACTOR GRASP SM DA VINCI (INSTRUMENTS) ×2
RETRACTOR GRASP SM DVNC (INSTRUMENTS) ×1 IMPLANT
SLEEVE ENDOPATH XCEL 5M (ENDOMECHANICALS) ×6 IMPLANT
SOL .9 NS 3000ML IRR  AL (IV SOLUTION) ×2
SOL .9 NS 3000ML IRR UROMATIC (IV SOLUTION) ×1 IMPLANT
SOLUTION ELECTROLUBE (MISCELLANEOUS) ×3 IMPLANT
SPOGE SURGIFLO 8M (HEMOSTASIS) ×2
SPONGE LAP 4X18 RFD (DISPOSABLE) ×3 IMPLANT
SPONGE SURGIFLO 8M (HEMOSTASIS) ×1 IMPLANT
SPONGE VERSALON 4X4 4PLY (MISCELLANEOUS) ×3 IMPLANT
STAPLE ECHEON FLEX 60 POW ENDO (STAPLE) ×3 IMPLANT
STAPLE RELOAD 2.5MM WHITE (STAPLE) IMPLANT
STAPLER RELOAD WHITE 60MM (STAPLE) ×9
STAPLER SKIN PROX 35W (STAPLE) ×3 IMPLANT
STAPLER VASCULAR ECHELON 35 (CUTTER) IMPLANT
STRAP SAFETY 5IN WIDE (MISCELLANEOUS) ×9 IMPLANT
SUT DVC VLOC 90 3-0 CV23 UNDY (SUTURE) ×6 IMPLANT
SUT ETHILON 3-0 FS-10 30 BLK (SUTURE)
SUT MNCRL AB 4-0 PS2 18 (SUTURE) ×6 IMPLANT
SUT PDS PLUS 0 (SUTURE)
SUT PDS PLUS AB 0 CT-2 (SUTURE) IMPLANT
SUT PROLENE 3 0 CT 1 (SUTURE) IMPLANT
SUT PROLENE 4 0 RB 1 (SUTURE) ×2
SUT PROLENE 4-0 RB1 .5 CRCL 36 (SUTURE) ×1 IMPLANT
SUT PROLENE 5 0 RB 1 DA (SUTURE) ×6 IMPLANT
SUT VIC AB 0 CT1 36 (SUTURE) ×6 IMPLANT
SUT VIC AB 2-0 SH 27 (SUTURE) ×4
SUT VIC AB 2-0 SH 27XBRD (SUTURE) ×2 IMPLANT
SUT VIC AB 4-0 RB1 27 (SUTURE)
SUT VIC AB 4-0 RB1 27X BRD (SUTURE) IMPLANT
SUT VICRYL 0 AB UR-6 (SUTURE) ×6 IMPLANT
SUT VICRYL PLUS ABS 0 54 (SUTURE) ×3 IMPLANT
SUTURE EHLN 3-0 FS-10 30 BLK (SUTURE) IMPLANT
TAPE CLOTH 10X20 WHT NS LF (TAPE) ×2 IMPLANT
TAPE CLOTH 2X10 WHT NS LF (TAPE) ×4
TRAY FOLEY MTR SLVR 16FR STAT (SET/KITS/TRAYS/PACK) ×3 IMPLANT
TROCAR 12M 150ML BLUNT (TROCAR) ×3 IMPLANT
TROCAR DISP BLADELESS 8 DVNC (TROCAR) ×1 IMPLANT
TROCAR DISP BLADELESS 8MM (TROCAR) ×2
TROCAR ENDOPATH XCEL 12X100 BL (ENDOMECHANICALS) ×3 IMPLANT
TROCAR XCEL 12X100 BLDLESS (ENDOMECHANICALS) ×6 IMPLANT
TROCAR XCEL NON-BLD 5MMX100MML (ENDOMECHANICALS) ×3 IMPLANT
TUBING INSUFFLATION (TUBING) ×3 IMPLANT

## 2018-11-17 NOTE — Anesthesia Procedure Notes (Signed)
Arterial Line Insertion Start/End1/27/2020 8:00 AM, 11/17/2018 8:07 AM Performed by: Emmie Niemann, MD, anesthesiologist  Preanesthetic checklist: patient identified, IV checked, site marked, risks and benefits discussed, surgical consent, monitors and equipment checked, pre-op evaluation, timeout performed and anesthesia consent radial was placed Catheter size: 20 G Hand hygiene performed  and Seldinger technique used Allen's test indicative of satisfactory collateral circulation Attempts: 2 Procedure performed without using ultrasound guided technique. Following insertion, Biopatch and dressing applied. Post procedure assessment: normal  Patient tolerated the procedure well with no immediate complications.

## 2018-11-17 NOTE — Op Note (Signed)
11/17/18  PREOPERATIVE DIAGNOSIS: Right renal mass  POSTOPERATIVE DIAGNOSIS:  1. Right renal mass  OPERATION PERFORMED: 1. Robotic assisted laparoscopic radical nephrectomy.  SURGEON: Hollice Espy, MD   Assistant: Nickolas Madrid, MD  SPECIMENS: 1. Renal kidney  BLOOD LOSS: 150 cc   Drains: 1. 16 Fr Foley   INDICATIONS FOR OPERATION: Right renal mass   DESCRIPTION OF OPERATION: Informed consent was obtained. The patient was marked on the right side and then taken to the operating room, placed supine on the operating table. General anesthesia was provided. SCDs were provided for DVT prophylaxis and IV antibiotics for bacterial prophylaxis. Foley catheter was placed to drain the bladder. OG tube was placed by anesthesia. The patient was then positioned in left lateral decubitus position with the right flank elevated about 70 degrees. All pressure points were carefully padded. The table was flexed slightly. The right arm was placed in a padded support. The patient was secured to the table with soft chest, hip, and lower extremity straps. The patient was prepped and draped in the usual sterile fashion. We had a time-out confirming the patient identification, the planned procedure, the surgical site, and all present were in agreement.   A Veress needle was placed just lateral to the right rectus belly at the level of the umbilicus. Aspiration, irrigation, and saline drop test confirmed good position. Opening pressure was less than 6 mmHg. The abdomen was insufflated to 15 mmHg. Following this, trocar sites were mapped out with two robotic trocars triangulating the expected location of the tumor, a 12 mm trocar between, at about the midclavicular line, for the camera, a 12 mm midline trocar for the assistant just above the umbilicus as well as a 5 mm just below the xiphoid, and an 67mm trocar in the right lower quadrant for the 4th arm.  I used a visual obturator to place a  5 mm trocar at one of the robotic port sites, and the peritoneal cavity was surveyed.   No injuries were appreciated. The remaining trocars were placed under laparoscopic visualization. The robot was docked. I placed monopolar shears in the right arm, bipolar in the left, and a double fenestrated grasper in the 4th arm.  The white line of Toldt was incised and the right colon was reflected. The tail of Gerota's fascia was lifted up and the ureter and gonadal vein were identified. The gonadal vein was left medial and the ureter was lifted up. The psoas muscle was identified under the ureter and I followed this plane towards the renal hilum.  The duodenum was identified and carefully dissected away from the hilum/vena cava and kocherized.  The hilar vessels were identified and exposed circumferentially.     I then worked to mobilize the kidney.  Gerota's fascia was freed from the body wall and the kidney was also mobilized away from the liver.  The tumor could be seen at the right upper pole however it was in a very difficult location this ultimately after further mobilization and dissection, the decision was made to pursue radical nephrectomy.  This is been discussed preop with the patient.  The risks of complicated partial nephrectomy outweighed the benefits for this elderly patient and thus I went ahead and elected to move forward with radical rather than partial nephrectomy.  A vascular load 60 mm Echelon stapler was used to take the hilum en bloc including the renal artery and vein.  There is a small tiny bleeding vessel, presumably a vasa vasorum to which  an additional Weck clip was applied on the proximal side.  Hemostasis appeared to be adequate at this point time.  2 additional staple loads were used in the medial upper pole hugging the kidney.  Eventually, I was able to completely mobilize the kidney, freed up.  Finally, the ureter was addressed with Weck clips x2 and incised.  The kidney was  placed on top of the liver and ultimately rolled into a large kidney bag without difficulty.   Liver was inspected to rule out injury. Omentum was inspected to make sure no bleeding from extensive lysis of adhesions. Trocars were removed under direct vision.  Kidney was extracted by extending the midline trochars from the xiphoid to the above the umbilicus. At the 12 mm trocar site, the 0 Vicryl sutures  used to close the fascia.  The extraction site was closed with a figure-of-eight #0 Vicryl sutures. All the wounds were copiously irrigated and then infiltrated with liposomal Marcaine.  The skin edges were re approximated with 4-0 Monocryl. Dermabond was applied.   All sponge, needle, and instrument counts were reported correct. The patient was awakened from anesthesia and transferred to recovery in stable condition. There were no complications and the patient tolerated the procedure well. Operative events were discussed with the patient's family.  Hollice Espy, MD

## 2018-11-17 NOTE — Anesthesia Procedure Notes (Signed)
Procedure Name: Intubation Date/Time: 11/17/2018 7:55 AM Performed by: Nelda Marseille, CRNA Pre-anesthesia Checklist: Patient identified, Patient being monitored, Timeout performed, Emergency Drugs available and Suction available Patient Re-evaluated:Patient Re-evaluated prior to induction Oxygen Delivery Method: Circle System Utilized Preoxygenation: Pre-oxygenation with 100% oxygen Induction Type: IV induction Ventilation: Mask ventilation without difficulty Laryngoscope Size: Mac and 3 Grade View: Grade I Tube type: Oral Tube size: 7.5 mm Number of attempts: 1 Airway Equipment and Method: Stylet Placement Confirmation: ETT inserted through vocal cords under direct vision,  positive ETCO2 and breath sounds checked- equal and bilateral Secured at: 21 cm Tube secured with: Tape Dental Injury: Teeth and Oropharynx as per pre-operative assessment

## 2018-11-17 NOTE — Anesthesia Postprocedure Evaluation (Signed)
Anesthesia Post Note  Patient: ELBERT SPICKLER  Procedure(s) Performed: ROBOTIC ASSISTED LAPAROSCOPIC NEPHRECTOMY (Right )  Patient location during evaluation: PACU Anesthesia Type: General Level of consciousness: awake and alert and oriented Pain management: pain level controlled Vital Signs Assessment: post-procedure vital signs reviewed and stable Respiratory status: spontaneous breathing, nonlabored ventilation and respiratory function stable Cardiovascular status: blood pressure returned to baseline and stable Postop Assessment: no signs of nausea or vomiting Anesthetic complications: no     Last Vitals:  Vitals:   11/17/18 1335 11/17/18 1410  BP: 134/76 135/70  Pulse: 88 93  Resp: 14   Temp: (!) 36.1 C 36.4 C  SpO2: 97% 98%    Last Pain:  Vitals:   11/17/18 1410  TempSrc: Axillary  PainSc:                  Rorey Bisson

## 2018-11-17 NOTE — Interval H&P Note (Signed)
History and Physical Interval Note:  11/17/2018 7:30 AM  Rudra [name]  has presented today for surgery, with the diagnosis of right renal mass  The various methods of treatment have been discussed with the patient and family. After consideration of risks, benefits and other options for treatment, the patient has consented to  Procedure(s): ROBOTIC ASSITED PARTIAL NEPHRECTOMY (Right) ROBOTIC ASSISTED LAPAROSCOPIC NEPHRECTOMY (Right) as a surgical intervention .  The patient's history has been reviewed, patient examined, no change in status, stable for surgery.  I have reviewed the patient's chart and labs.  Questions were answered to the patient's satisfaction.    RRR CTAB   We have discussed at length the possibility of radical nephrectomy versus partial.  Patient prefers radical nephrectomy but if the tumor does appear to be easily amenable to partial with low risk of complications, the patient would be open to that.  The ultimate decision will be made intraoperatively and he feels comfortable with that.  All additional questions answered.   Hollice Espy

## 2018-11-17 NOTE — Anesthesia Post-op Follow-up Note (Signed)
Anesthesia QCDR form completed.        

## 2018-11-17 NOTE — Transfer of Care (Signed)
Immediate Anesthesia Transfer of Care Note  Patient: Kevin [name]  Procedure(s) Performed: ROBOTIC ASSITED PARTIAL NEPHRECTOMY (Right ) ROBOTIC ASSISTED LAPAROSCOPIC NEPHRECTOMY (Right )  Patient Location: PACU  Anesthesia Type:General  Level of Consciousness: sedated  Airway & Oxygen Therapy: Patient connected to face mask oxygen  Post-op Assessment: Post -op Vital signs reviewed and stable  Post vital signs: stable  Last Vitals:  Vitals Value Taken Time  BP 127/68 11/17/2018 11:31 AM  Temp    Pulse 72 11/17/2018 11:31 AM  Resp 12 11/17/2018 11:31 AM  SpO2 100 % 11/17/2018 11:31 AM  Vitals shown include unvalidated device data.  Last Pain:  Vitals:   11/17/18 0656  PainSc: 0-No pain         Complications: No apparent anesthesia complications

## 2018-11-18 ENCOUNTER — Encounter: Payer: Self-pay | Admitting: Urology

## 2018-11-18 LAB — TYPE AND SCREEN
ABO/RH(D): O POS
Antibody Screen: NEGATIVE
UNIT DIVISION: 0
Unit division: 0

## 2018-11-18 LAB — CBC
HCT: 33 % — ABNORMAL LOW (ref 39.0–52.0)
Hemoglobin: 11.5 g/dL — ABNORMAL LOW (ref 13.0–17.0)
MCH: 31.4 pg (ref 26.0–34.0)
MCHC: 34.8 g/dL (ref 30.0–36.0)
MCV: 90.2 fL (ref 80.0–100.0)
NRBC: 0 % (ref 0.0–0.2)
Platelets: 145 10*3/uL — ABNORMAL LOW (ref 150–400)
RBC: 3.66 MIL/uL — ABNORMAL LOW (ref 4.22–5.81)
RDW: 12.1 % (ref 11.5–15.5)
WBC: 8 10*3/uL (ref 4.0–10.5)

## 2018-11-18 LAB — BASIC METABOLIC PANEL
Anion gap: 5 (ref 5–15)
BUN: 17 mg/dL (ref 8–23)
CO2: 23 mmol/L (ref 22–32)
Calcium: 7.9 mg/dL — ABNORMAL LOW (ref 8.9–10.3)
Chloride: 101 mmol/L (ref 98–111)
Creatinine, Ser: 1.18 mg/dL (ref 0.61–1.24)
GFR calc Af Amer: >60 mL/min (ref >60)
GFR calc non Af Amer: 56 mL/min — ABNORMAL LOW (ref >60)
Glucose, Bld: 111 mg/dL — ABNORMAL HIGH (ref 70–99)
Potassium: 3.5 mmol/L (ref 3.5–5.1)
Sodium: 129 mmol/L — ABNORMAL LOW (ref 135–145)

## 2018-11-18 LAB — BPAM RBC
Blood Product Expiration Date: 202002292359
Blood Product Expiration Date: 202002292359
Unit Type and Rh: 5100
Unit Type and Rh: 5100

## 2018-11-18 MED ORDER — DOCUSATE SODIUM 100 MG PO CAPS: 100.0000 mg | ORAL_CAPSULE | Freq: Two times a day (BID) | ORAL | 0 refills | Status: DC

## 2018-11-18 MED ORDER — OXYCODONE-ACETAMINOPHEN 5-325 MG PO TABS: 1.0000 | ORAL_TABLET | ORAL | 0 refills | Status: DC | PRN

## 2018-11-18 NOTE — Discharge Instructions (Signed)
·   Activity:  You are encouraged to ambulate frequently (about every hour during waking hours) to help prevent blood clots from forming in your legs or lungs.  However, you should not engage in any heavy lifting (> 5-10 lbs), strenuous activity, or straining. ° °· Diet: You should advance your diet as instructed by your physician.  It will be normal to have some bloating, nausea, and abdominal discomfort intermittently. ° °· Prescriptions:  You will be provided a prescription for pain medication to take as needed.  If your pain is not severe enough to require the prescription pain medication, you may take extra strength Tylenol instead which will have less side effects.  You should also take a prescribed stool softener to avoid straining with bowel movements as the prescription pain medication may constipate you. ° °· Incisions: You may remove your dressing bandages 48 hours after surgery if not removed in the hospital.  You will either have some small staples or special tissue glue at each of the incision sites. Once the bandages are removed (if present), the incisions may stay open to air.  You may start showering (but not soaking or bathing in water) the 2nd day after surgery and the incisions simply need to be patted dry after the shower.  No additional care is needed. ° °What to call us about: You should call the office if you develop fever > 101 or develop persistent vomiting, redness or draining around your incision, or any other concerning symptoms.   ° °[facility] °[address], [address] °[place], [place] [address] °[phone] ° ° °

## 2018-11-18 NOTE — Progress Notes (Signed)
Discharge instructions reviewed with patient including followup visits and new medications.  Understanding was verbalized and all questions were answered.  IV removed without complication; patient tolerated well.  Patient discharged home via wheelchair in stable condition escorted by volunteer staff.  

## 2018-11-18 NOTE — Discharge Summary (Signed)
Date of admission: 11/17/2018  Date of discharge: 11/18/2018  Admission diagnosis: Right renal cell carcinoma  Discharge diagnosis: same as above  Secondary diagnoses:  Patient Active Problem List   Diagnosis Date Noted  . Right renal mass 11/17/2018    History and Physical: For full details, please see admission history and physical. Briefly, [place] SHELLHAMMER is a 85 y.o. year old patient with right renal mass admitted following uncomplicated robotic radical nephrectomy.   Hospital Course: Patient tolerated the procedure well.  He was then transferred to the floor after an uneventful PACU stay.  His hospital course was uncomplicated.  On POD#1 he had met discharge criteria: was eating a regular diet, was up and ambulating independently,  pain was well controlled, was voiding without a catheter, and was ready to for discharge.   Physical Exam Vitals signs (wife at bedside) reviewed.  Constitutional:      Appearance: Normal appearance.  HENT:     Head: Normocephalic and atraumatic.     Mouth/Throat:     Mouth: Mucous membranes are dry.  Cardiovascular:     Rate and Rhythm: Normal rate and regular rhythm.  Pulmonary:     Effort: Pulmonary effort is normal. No respiratory distress.  Abdominal:     General: Abdomen is flat. There is no distension.     Palpations: Abdomen is soft.     Comments: Incision c/d/i.  Subtile subcutaneous crepitus around robotic incision appreciated.    Genitourinary:    Penis: Normal.      Comments: Foley draining clear yellow urine- d/c'd later in day Musculoskeletal:     Left lower leg: No edema.  Skin:    General: Skin is warm and dry.     Capillary Refill: Capillary refill takes less than 2 seconds.  Neurological:     Mental Status: He is alert and oriented to person, place, and time.  Psychiatric:        Mood and Affect: Mood normal.        Behavior: Behavior normal.      Laboratory values:  Recent Labs    11/18/18 0517  WBC 8.0  HGB  11.5*  HCT 33.0*   Recent Labs    11/18/18 0517  NA 129*  K 3.5  CL 101  CO2 23  GLUCOSE 111*  BUN 17  CREATININE 1.18  CALCIUM 7.9*   No results for input(s): LABPT, INR in the last 72 hours. No results for input(s): LABURIN in the last 72 hours. Results for orders placed or performed during the hospital encounter of 11/03/18  Urine culture     Status: None   Collection Time: 11/03/18  8:40 AM  Result Value Ref Range Status   Specimen Description   Final    URINE, CLEAN CATCH [facility] at  [facility], [address] [place], [place] [address]    Special Requests   Final    NONE Performed at [facility], [address] [place], [place] [address]    Culture   Final    NO GROWTH Performed at [facility], [place] [address] [place], [place] [address]    Report Status 11/04/2018 FINAL  Final    Disposition: Home   Discharge instruction: Activity:  You are encouraged to ambulate frequently (about every hour during waking hours) to help prevent blood clots from forming in your legs or lungs.  However, you should not engage in any heavy lifting (> 5-10 lbs), strenuous activity, or straining.  Diet: You should advance your diet as instructed by your physician.  It will be normal to have some bloating, nausea, and abdominal discomfort intermittently.   Prescriptions:  You will be provided a prescription for pain medication to take as needed.  If your pain is not severe enough to require the prescription pain medication, you may take extra strength Tylenol instead which will have less side effects.  You should also take a prescribed stool softener to avoid straining with bowel movements as the prescription pain medication may constipate you.   Incisions: You may remove your dressing bandages 48 hours after surgery if not removed in the hospital.  You will either have some small staples or special tissue glue at each of the incision sites. Once the  bandages are removed (if present), the incisions may stay open to air.  You may start showering (but not soaking or bathing in water) the 2nd day after surgery and the incisions simply need to be patted dry after the shower.  No additional care is needed.  What to call us about: You should call the office if you develop fever > 101 or develop persistent vomiting, redness or draining around your incision, or any other concerning symptoms.    [facility] [address], [place], [place] [address] [phone]    Discharge medications:  Allergies as of 11/18/2018   No Known Allergies     Medication List    TAKE these medications   amLODipine 5 MG tablet Commonly known as:  NORVASC Take 5 mg by mouth daily.   docusate sodium 100 MG capsule Commonly known as:  COLACE Take 1 capsule (100 mg total) by mouth 2 (two) times daily.   hydrochlorothiazide 25 MG tablet Commonly known as:  HYDRODIURIL Take 25 mg by mouth daily.   oxyCODONE-acetaminophen 5-325 MG tablet Commonly known as:  PERCOCET Take 1-2 tablets by mouth every 4 (four) hours as needed for moderate pain or severe pain.   PRESERVISION AREDS 2 PO Take 1 tablet by mouth daily.   PROVENTIL HFA 108 (90 Base) MCG/ACT inhaler Generic drug:  albuterol Inhale 2 puffs into the lungs every 6 (six) hours as needed for wheezing or shortness of breath.   simvastatin 10 MG tablet Commonly known as:  ZOCOR Take 10 mg by mouth daily.       Followup:  Follow-up Information    Hollice Espy, MD In 4 weeks.   Specialty:  Urology Why:  as Arna Medici scheduled Contact information: [place] [id number] [phone]

## 2018-11-21 ENCOUNTER — Telehealth: Payer: Self-pay | Admitting: Urology

## 2018-11-21 ENCOUNTER — Other Ambulatory Visit: Payer: Self-pay | Admitting: Pathology

## 2018-11-21 LAB — SURGICAL PATHOLOGY

## 2018-11-21 NOTE — Telephone Encounter (Signed)
Pt's wife called and states that he has not had a bowel movement since last Sunday. SHe says that he has been taking colace and miralax with no luck. Please advise. Thank You

## 2018-11-21 NOTE — Telephone Encounter (Signed)
Tried calling patient.  No answer and no machine.

## 2018-11-25 NOTE — Telephone Encounter (Signed)
Spoke with patient's wife, he is doing well now and has since had a bowel movement

## 2018-11-27 ENCOUNTER — Other Ambulatory Visit: Payer: Medicare HMO

## 2018-11-27 NOTE — Progress Notes (Signed)
Tumor Board Documentation  MONTFORD BARG was presented by Dr Erlene Quan at our Tumor Board on 11/27/2018, which included representatives from medical oncology, radiation oncology, surgical oncology, surgical, radiology, pathology, navigation, internal medicine, pharmacy, research, pulmonology.  Teagan currently presents for discussion, for [place], for new positive pathology with history of the following treatments: surgical intervention(s).  Additionally, we reviewed previous medical and familial history, history of present illness, and recent lab results along with all available histopathologic and imaging studies. The tumor board considered available treatment options and made the following recommendations: Active surveillance    The following procedures/referrals were also placed: No orders of the defined types were placed in this encounter.   Clinical Trial Status: not discussed   Staging used:  AJCC AJCC Staging: T: pT1b     Group: Type 2 Pailiiary Renal Cell  National site-specific guidelines   were discussed with respect to the case.  Tumor board is a meeting of clinicians from various specialty areas who evaluate and discuss patients for whom a multidisciplinary approach is being considered. Final determinations in the plan of care are those of the provider(s). The responsibility for follow up of recommendations given during tumor board is that of the provider.   Today's extended care, comprehensive team conference, Ankush was not present for the discussion and was not examined.   Multidisciplinary Tumor Board is a multidisciplinary case peer review process.  Decisions discussed in the Multidisciplinary Tumor Board reflect the opinions of the specialists present at the conference without having examined the patient.  Ultimately, treatment and diagnostic decisions rest with the primary provider(s) and the patient.

## 2018-12-17 ENCOUNTER — Ambulatory Visit (INDEPENDENT_AMBULATORY_CARE_PROVIDER_SITE_OTHER): Payer: Medicare HMO | Admitting: Urology

## 2018-12-17 ENCOUNTER — Encounter: Payer: Self-pay | Admitting: Urology

## 2018-12-17 VITALS — BP 123/75 | HR 87 | Ht 72.0 in | Wt 174.0 lb

## 2018-12-17 DIAGNOSIS — N2889 Other specified disorders of kidney and ureter: Secondary | ICD-10-CM

## 2018-12-17 NOTE — Progress Notes (Signed)
12/17/2018 9:05 AM   [name] 1933-04-05 [id number]  Referring provider: Baxter Hire, MD [place], [place] [address]  Chief Complaint  Patient presents with  . Post-op Follow-up    4wk    HPI: Kevin [name] is a 85 yo M who returns today for a 4 week f/u post op robotic assisted laparoscopic nephrectomy. He is accompanied by his wife.   His CT from 10/30/2018 showed a similar size and appearance of an exophytic interpolar right renal solid mass, consistent with renal cell carcinoma, similar size of a complex left renal lesion was also noted as similar to previous MRI.   On 11/17/2018 he underwent robotic assisted laparoscopic nephrectomy.  This was uncomplicated.  He was discharged home on postop day 1.  He is feeling well overall and has been engaging in physical activities. He is interested in going back to the gym and lifting weights.   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 PT1b, Nx.  PMH: Past Medical History:  Diagnosis Date  . Asthma   . Broken arm    Pt was 19  . 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 Cataract cortical, senile   . Colon polyps   . GERD (gastroesophageal reflux disease)   . Hyperlipidemia   . Hypertension   . Pneumonia    AS A CHILD    Surgical History: Past Surgical History:  Procedure Laterality Date  . COLONOSCOPY  2001, 2006, 2012  . ESOPHAGOGASTRODUODENOSCOPY  09/11/2000  . FLEXIBLE SIGMOIDOSCOPY  03/02/1997  . NASAL POLYP EXCISION    . ROBOT ASSISTED LAPAROSCOPIC NEPHRECTOMY Right 11/17/2018   Procedure: ROBOTIC ASSISTED LAPAROSCOPIC NEPHRECTOMY;  Surgeon: Hollice Espy, MD;  Location: [facility] ORS;  Service: Urology;  Laterality: Right;    Home Medications:  Allergies as of 12/17/2018   No Known Allergies     Medication List       Accurate as of December 17, 2018 11:59 PM. Always use your most recent med  list.        amLODipine 5 MG tablet Commonly known as:  NORVASC Take 5 mg by mouth daily.   docusate sodium 100 MG capsule Commonly known as:  COLACE Take 1 capsule (100 mg total) by mouth 2 (two) times daily.   hydrochlorothiazide 25 MG tablet Commonly known as:  HYDRODIURIL Take 25 mg by mouth daily.   oxyCODONE-acetaminophen 5-325 MG tablet Commonly known as:  PERCOCET Take 1-2 tablets by mouth every 4 (four) hours as needed for moderate pain or severe pain.   PRESERVISION AREDS 2 PO Take 1 tablet by mouth daily.   PROVENTIL HFA 108 (90 Base) MCG/ACT inhaler Generic drug:  albuterol Inhale 2 puffs into the lungs every 6 (six) hours as needed for wheezing or shortness of breath.   simvastatin 10 MG tablet Commonly known as:  ZOCOR Take 10 mg by mouth daily.       Allergies: No Known Allergies  Family History: Family History  Problem Relation Age of Onset  . Heart disease Mother   . Pancreatitis Mother   . Emphysema Father   . Breast cancer Sister   . Breast cancer Sister     Social History:  reports that he has never smoked. He has never used smokeless tobacco. He reports that he does not drink alcohol or use drugs.  ROS: UROLOGY Frequent Urination?: No Hard to postpone urination?:  No Burning/pain with urination?: No Get up at night to urinate?: Yes Leakage of urine?: No Urine stream starts and stops?: Yes Trouble starting stream?: No Do you have to strain to urinate?: No Blood in urine?: No Urinary tract infection?: No Sexually transmitted disease?: No Injury to kidneys or bladder?: Yes Painful intercourse?: No Weak stream?: No Erection problems?: No Penile pain?: No  Gastrointestinal Nausea?: No Vomiting?: No Indigestion/heartburn?: No Diarrhea?: No Constipation?: Yes  Constitutional Fever: No Night sweats?: No Weight loss?: No Fatigue?: No  Skin Skin rash/lesions?: No Itching?: No  Eyes Blurred vision?: No Double vision?:  No  Ears/Nose/Throat Sore throat?: No Sinus problems?: No  Hematologic/Lymphatic Swollen glands?: No Easy bruising?: No  Cardiovascular Leg swelling?: No Chest pain?: No  Respiratory Cough?: No Shortness of breath?: No  Endocrine Excessive thirst?: No  Musculoskeletal Back pain?: No Joint pain?: No  Neurological Headaches?: No Dizziness?: No  Psychologic Depression?: No Anxiety?: No  Physical Exam: BP 123/75   Pulse 87   Ht 6' (1.829 m)   Wt 174 lb (78.9 kg)   BMI 23.60 kg/m   Constitutional:  Alert and oriented, No acute distress. HEENT: [place] AT, moist mucus membranes.  Trachea midline, no masses. Cardiovascular: No clubbing, cyanosis, or edema. Respiratory: Normal respiratory effort, no increased work of breathing. GI: Abdomen is soft, nontender, nondistended, no abdominal masses, incisions were clean, intact and resolving, no hernias appreciated, no infections. Skin: No rashes, bruises or suspicious lesions. Neurologic: Grossly intact, no focal deficits, moving all 4 extremities. Psychiatric: Normal mood and affect.  Assessment & Plan:    1. Right renal cell carcinoma   pT1b papillary renal cell carcinoma status post uncomplicated right radical nephrectomy Encouraged pt to not lift weights for 2 more weeks, can start moderate activity today  BMP for new Cr baseline- pending Reviewed solitary kidney precautions   2. Left renal mass  Recommend surveillance at this time with consideration of ablative therapyif progresses RTC for CT of abdomen w/wo in 6 months  Return in about 6 months (around 06/17/2019) for CT scan.  [place] [address] [place], [place] [address] [phone]  I, Lucas Mallow, am acting as a scribe for Dr. Hollice Espy,  I have reviewed the above documentation for accuracy and completeness, and I agree with the above.   Hollice Espy, MD

## 2018-12-18 ENCOUNTER — Telehealth: Payer: Self-pay

## 2018-12-18 ENCOUNTER — Ambulatory Visit: Payer: Medicare HMO | Admitting: Urology

## 2018-12-18 LAB — BASIC METABOLIC PANEL
BUN/Creatinine Ratio: 13 (ref 10–24)
BUN: 19 mg/dL (ref 8–27)
CO2: 25 mmol/L (ref 20–29)
CREATININE: 1.41 mg/dL — AB (ref 0.76–1.27)
Calcium: 9.2 mg/dL (ref 8.6–10.2)
Chloride: 93 mmol/L — ABNORMAL LOW (ref 96–106)
GFR calc Af Amer: 53 mL/min/{1.73_m2} — ABNORMAL LOW (ref >59)
GFR calc non Af Amer: 45 mL/min/{1.73_m2} — ABNORMAL LOW (ref >59)
Glucose: 97 mg/dL (ref 65–99)
Potassium: 3.8 mmol/L (ref 3.5–5.2)
Sodium: 134 mmol/L (ref 134–144)

## 2018-12-18 NOTE — Telephone Encounter (Signed)
-----   Message from Kevin Espy, MD sent at 12/18/2018  8:15 AM EST ----- Please let [name] know that his overall renal function has declined slightly, his GFR is 53 and normal is 60.  This is still quite good, puts him in the category of stage II renal insufficiency of 5.  No further interventions at this time.  Follow-up in 6 months with scans as scheduled.  Kevin Espy, MD

## 2018-12-18 NOTE — Telephone Encounter (Signed)
Mychart sent.

## 2019-03-04 ENCOUNTER — Emergency Department: Payer: Medicare HMO

## 2019-03-04 ENCOUNTER — Encounter: Payer: Self-pay | Admitting: *Deleted

## 2019-03-04 ENCOUNTER — Inpatient Hospital Stay: Payer: Medicare HMO

## 2019-03-04 ENCOUNTER — Inpatient Hospital Stay
Admission: EM | Admit: 2019-03-04 | Discharge: 2019-03-07 | DRG: 872 | Disposition: A | Discharge status: Home / Self Care | Payer: Medicare HMO | Source: Skilled Nursing Facility | Attending: Internal Medicine | Admitting: Internal Medicine

## 2019-03-04 ENCOUNTER — Other Ambulatory Visit: Payer: Self-pay

## 2019-03-04 DIAGNOSIS — I452 Bifascicular block: Secondary | ICD-10-CM | POA: Diagnosis present

## 2019-03-04 DIAGNOSIS — N179 Acute kidney failure, unspecified: Secondary | ICD-10-CM | POA: Diagnosis present

## 2019-03-04 DIAGNOSIS — I10 Essential (primary) hypertension: Secondary | ICD-10-CM | POA: Diagnosis present

## 2019-03-04 DIAGNOSIS — R0902 Hypoxemia: Secondary | ICD-10-CM | POA: Diagnosis present

## 2019-03-04 DIAGNOSIS — Z803 Family history of malignant neoplasm of breast: Secondary | ICD-10-CM | POA: Diagnosis not present

## 2019-03-04 DIAGNOSIS — R509 Fever, unspecified: Secondary | ICD-10-CM | POA: Diagnosis not present

## 2019-03-04 DIAGNOSIS — Z8249 Family history of ischemic heart disease and other diseases of the circulatory system: Secondary | ICD-10-CM | POA: Diagnosis not present

## 2019-03-04 DIAGNOSIS — H9190 Unspecified hearing loss, unspecified ear: Secondary | ICD-10-CM | POA: Diagnosis present

## 2019-03-04 DIAGNOSIS — E876 Hypokalemia: Secondary | ICD-10-CM | POA: Diagnosis present

## 2019-03-04 DIAGNOSIS — J189 Pneumonia, unspecified organism: Secondary | ICD-10-CM | POA: Diagnosis present

## 2019-03-04 DIAGNOSIS — C641 Malignant neoplasm of right kidney, except renal pelvis: Secondary | ICD-10-CM | POA: Diagnosis present

## 2019-03-04 DIAGNOSIS — Z905 Acquired absence of kidney: Secondary | ICD-10-CM | POA: Diagnosis not present

## 2019-03-04 DIAGNOSIS — A419 Sepsis, unspecified organism: Secondary | ICD-10-CM

## 2019-03-04 DIAGNOSIS — J45909 Unspecified asthma, uncomplicated: Secondary | ICD-10-CM | POA: Diagnosis present

## 2019-03-04 DIAGNOSIS — Z79899 Other long term (current) drug therapy: Secondary | ICD-10-CM

## 2019-03-04 DIAGNOSIS — E871 Hypo-osmolality and hyponatremia: Secondary | ICD-10-CM | POA: Diagnosis present

## 2019-03-04 DIAGNOSIS — Z85828 Personal history of other malignant neoplasm of skin: Secondary | ICD-10-CM

## 2019-03-04 DIAGNOSIS — Z20828 Contact with and (suspected) exposure to other viral communicable diseases: Secondary | ICD-10-CM | POA: Diagnosis present

## 2019-03-04 DIAGNOSIS — E785 Hyperlipidemia, unspecified: Secondary | ICD-10-CM | POA: Diagnosis present

## 2019-03-04 DIAGNOSIS — Z825 Family history of asthma and other chronic lower respiratory diseases: Secondary | ICD-10-CM | POA: Diagnosis not present

## 2019-03-04 DIAGNOSIS — A4151 Sepsis due to Escherichia coli [E. coli]: Secondary | ICD-10-CM | POA: Diagnosis not present

## 2019-03-04 LAB — PROCALCITONIN: Procalcitonin: 0.32 ng/mL

## 2019-03-04 LAB — CBC WITH DIFFERENTIAL/PLATELET
Abs Immature Granulocytes: 0.04 10*3/uL (ref 0.00–0.07)
Basophils Absolute: 0 10*3/uL (ref 0.0–0.1)
Basophils Relative: 0 %
Eosinophils Absolute: 0 10*3/uL (ref 0.0–0.5)
Eosinophils Relative: 0 %
HCT: 30.5 % — ABNORMAL LOW (ref 39.0–52.0)
Hemoglobin: 10.9 g/dL — ABNORMAL LOW (ref 13.0–17.0)
Immature Granulocytes: 1 %
Lymphocytes Relative: 4 %
Lymphs Abs: 0.2 10*3/uL — ABNORMAL LOW (ref 0.7–4.0)
MCH: 31.1 pg (ref 26.0–34.0)
MCHC: 35.7 g/dL (ref 30.0–36.0)
MCV: 86.9 fL (ref 80.0–100.0)
Monocytes Absolute: 0.4 10*3/uL (ref 0.1–1.0)
Monocytes Relative: 8 %
Neutro Abs: 4.5 10*3/uL (ref 1.7–7.7)
Neutrophils Relative %: 87 %
Platelets: 117 10*3/uL — ABNORMAL LOW (ref 150–400)
RBC: 3.51 MIL/uL — ABNORMAL LOW (ref 4.22–5.81)
RDW: 12.2 % (ref 11.5–15.5)
WBC: 5.2 10*3/uL (ref 4.0–10.5)
nRBC: 0 % (ref 0.0–0.2)

## 2019-03-04 LAB — BLOOD CULTURE ID PANEL (REFLEXED)

## 2019-03-04 LAB — LACTIC ACID, PLASMA: Lactic Acid, Venous: 1 mmol/L (ref 0.5–1.9)

## 2019-03-04 LAB — URINALYSIS, COMPLETE (UACMP) WITH MICROSCOPIC
Bacteria, UA: NONE SEEN
Bilirubin Urine: NEGATIVE
Glucose, UA: NEGATIVE mg/dL
Ketones, ur: NEGATIVE mg/dL
Leukocytes,Ua: NEGATIVE
Nitrite: NEGATIVE
Protein, ur: NEGATIVE mg/dL
Specific Gravity, Urine: 1.016 (ref 1.005–1.030)
pH: 6 (ref 5.0–8.0)

## 2019-03-04 LAB — BLOOD GAS, VENOUS
Acid-Base Excess: 4 mmol/L — ABNORMAL HIGH (ref 0.0–2.0)
Bicarbonate: 27.6 mmol/L (ref 20.0–28.0)
O2 Saturation: 65.5 %
Patient temperature: 37
pCO2, Ven: 37 mmHg — ABNORMAL LOW (ref 44.0–60.0)
pH, Ven: 7.48 — ABNORMAL HIGH (ref 7.250–7.430)
pO2, Ven: 31 mmHg — CL (ref 32.0–45.0)

## 2019-03-04 LAB — COMPREHENSIVE METABOLIC PANEL
ALT: 20 U/L (ref 0–44)
AST: 21 U/L (ref 15–41)
Albumin: 3.6 g/dL (ref 3.5–5.0)
Alkaline Phosphatase: 44 U/L (ref 38–126)
Anion gap: 9 (ref 5–15)
BUN: 26 mg/dL — ABNORMAL HIGH (ref 8–23)
CO2: 24 mmol/L (ref 22–32)
Calcium: 8.4 mg/dL — ABNORMAL LOW (ref 8.9–10.3)
Chloride: 97 mmol/L — ABNORMAL LOW (ref 98–111)
Creatinine, Ser: 1.22 mg/dL (ref 0.61–1.24)
GFR calc Af Amer: >60 mL/min (ref >60)
GFR calc non Af Amer: 54 mL/min — ABNORMAL LOW (ref >60)
Glucose, Bld: 114 mg/dL — ABNORMAL HIGH (ref 70–99)
Potassium: 3.1 mmol/L — ABNORMAL LOW (ref 3.5–5.1)
Sodium: 130 mmol/L — ABNORMAL LOW (ref 135–145)
Total Bilirubin: 1 mg/dL (ref 0.3–1.2)
Total Protein: 6.4 g/dL — ABNORMAL LOW (ref 6.5–8.1)

## 2019-03-04 LAB — TRIGLYCERIDES: Triglycerides: 30 mg/dL (ref <150)

## 2019-03-04 LAB — STREP PNEUMONIAE URINARY ANTIGEN: Strep Pneumo Urinary Antigen: NEGATIVE

## 2019-03-04 LAB — LIPASE, BLOOD: Lipase: 28 U/L (ref 11–51)

## 2019-03-04 LAB — TROPONIN I: Troponin I: <0.03 ng/mL (ref <0.03)

## 2019-03-04 LAB — C-REACTIVE PROTEIN: CRP: 3.2 mg/dL — ABNORMAL HIGH (ref <1.0)

## 2019-03-04 LAB — MAGNESIUM: Magnesium: 1.7 mg/dL (ref 1.7–2.4)

## 2019-03-04 LAB — SARS CORONAVIRUS 2 BY RT PCR (HOSPITAL ORDER, PERFORMED IN ~~LOC~~ HOSPITAL LAB): SARS Coronavirus 2: NEGATIVE

## 2019-03-04 LAB — BRAIN NATRIURETIC PEPTIDE: B Natriuretic Peptide: 88 pg/mL (ref 0.0–100.0)

## 2019-03-04 LAB — FERRITIN: Ferritin: 282 ng/mL (ref 24–336)

## 2019-03-04 LAB — LACTATE DEHYDROGENASE: LDH: 104 U/L (ref 98–192)

## 2019-03-04 LAB — PROTIME-INR
INR: 1.7 — ABNORMAL HIGH (ref 0.8–1.2)
Prothrombin Time: 20.1 seconds — ABNORMAL HIGH (ref 11.4–15.2)

## 2019-03-04 LAB — FIBRINOGEN: Fibrinogen: 331 mg/dL (ref 210–475)

## 2019-03-04 LAB — FIBRIN DERIVATIVES D-DIMER (ARMC ONLY): Fibrin derivatives D-dimer (ARMC): 6231.62 ng/mL (FEU) — ABNORMAL HIGH (ref 0.00–499.00)

## 2019-03-04 MED ORDER — ACETAMINOPHEN 650 MG RE SUPP: 650.0000 mg | Freq: Four times a day (QID) | RECTAL | Status: DC | PRN

## 2019-03-04 MED ORDER — ACETAMINOPHEN 325 MG PO TABS: 650.0000 mg | ORAL_TABLET | Freq: Four times a day (QID) | ORAL | Status: DC | PRN

## 2019-03-04 MED ORDER — POTASSIUM CHLORIDE 20 MEQ PO PACK
40.0000 meq | PACK | Freq: Once | ORAL | Status: AC
  Administered 2019-03-04: 09:00:00 40 meq via ORAL
  Filled 2019-03-04: qty 2

## 2019-03-04 MED ORDER — ENOXAPARIN SODIUM 40 MG/0.4ML ~~LOC~~ SOLN
40.0000 mg | SUBCUTANEOUS | Status: DC
  Administered 2019-03-04 – 2019-03-06 (×3): 40 mg via SUBCUTANEOUS
  Filled 2019-03-04 (×3): qty 0.4

## 2019-03-04 MED ORDER — IOHEXOL 350 MG/ML SOLN
75.0000 mL | Freq: Once | INTRAVENOUS | Status: AC | PRN
  Administered 2019-03-04: 07:00:00 75 mL via INTRAVENOUS

## 2019-03-04 MED ORDER — SODIUM CHLORIDE 0.9 % IV SOLN
2.0000 g | INTRAVENOUS | Status: DC
  Administered 2019-03-04: 05:00:00 2 g via INTRAVENOUS
  Filled 2019-03-04: qty 20

## 2019-03-04 MED ORDER — IPRATROPIUM-ALBUTEROL 0.5-2.5 (3) MG/3ML IN SOLN
3.0000 mL | Freq: Four times a day (QID) | RESPIRATORY_TRACT | Status: DC
  Administered 2019-03-04 – 2019-03-05 (×6): 3 mL via RESPIRATORY_TRACT
  Filled 2019-03-04 (×7): qty 3

## 2019-03-04 MED ORDER — POTASSIUM CHLORIDE 20 MEQ PO PACK
40.0000 meq | PACK | Freq: Once | ORAL | Status: AC
  Administered 2019-03-04: 40 meq via ORAL
  Filled 2019-03-04: qty 2

## 2019-03-04 MED ORDER — TRAZODONE HCL 50 MG PO TABS
25.0000 mg | ORAL_TABLET | Freq: Every evening | ORAL | Status: DC | PRN
  Administered 2019-03-06: 01:00:00 25 mg via ORAL
  Filled 2019-03-04: qty 1

## 2019-03-04 MED ORDER — SODIUM CHLORIDE 0.9 % IV SOLN
INTRAVENOUS | Status: DC
  Administered 2019-03-04 – 2019-03-06 (×4): via INTRAVENOUS

## 2019-03-04 MED ORDER — SIMVASTATIN 20 MG PO TABS
10.0000 mg | ORAL_TABLET | Freq: Every day | ORAL | Status: DC
  Administered 2019-03-04 – 2019-03-06 (×3): 10 mg via ORAL
  Filled 2019-03-04 (×3): qty 1

## 2019-03-04 MED ORDER — SODIUM CHLORIDE 0.9 % IV BOLUS (SEPSIS)
500.0000 mL | Freq: Once | INTRAVENOUS | Status: AC
  Administered 2019-03-04: 05:00:00 500 mL via INTRAVENOUS

## 2019-03-04 MED ORDER — SODIUM CHLORIDE 0.9 % IV SOLN
500.0000 mg | INTRAVENOUS | Status: DC
  Administered 2019-03-04: 05:00:00 500 mg via INTRAVENOUS
  Filled 2019-03-04: qty 500

## 2019-03-04 MED ORDER — GUAIFENESIN ER 600 MG PO TB12
600.0000 mg | ORAL_TABLET | Freq: Two times a day (BID) | ORAL | Status: DC
  Administered 2019-03-04 – 2019-03-07 (×7): 600 mg via ORAL
  Filled 2019-03-04 (×7): qty 1

## 2019-03-04 MED ORDER — PROMETHAZINE HCL 25 MG PO TABS
12.5000 mg | ORAL_TABLET | Freq: Four times a day (QID) | ORAL | Status: DC | PRN
  Filled 2019-03-04: qty 1

## 2019-03-04 MED ORDER — SODIUM CHLORIDE 0.9 % IV SOLN
500.0000 mg | INTRAVENOUS | Status: DC
  Administered 2019-03-05 – 2019-03-06 (×2): 500 mg via INTRAVENOUS
  Filled 2019-03-04 (×2): qty 500

## 2019-03-04 MED ORDER — MAGNESIUM HYDROXIDE 400 MG/5ML PO SUSP
30.0000 mL | Freq: Every day | ORAL | Status: DC | PRN
  Filled 2019-03-04: qty 30

## 2019-03-04 MED ORDER — ALBUTEROL SULFATE (2.5 MG/3ML) 0.083% IN NEBU: 2.5000 mg | INHALATION_SOLUTION | Freq: Four times a day (QID) | RESPIRATORY_TRACT | Status: DC | PRN

## 2019-03-04 MED ORDER — SODIUM CHLORIDE 0.9 % IV SOLN
2.0000 g | INTRAVENOUS | Status: DC
  Administered 2019-03-05 – 2019-03-06 (×2): 2 g via INTRAVENOUS
  Filled 2019-03-04 (×2): qty 2

## 2019-03-04 MED ORDER — AMLODIPINE BESYLATE 5 MG PO TABS
5.0000 mg | ORAL_TABLET | Freq: Every day | ORAL | Status: DC
  Administered 2019-03-05 – 2019-03-07 (×3): 5 mg via ORAL
  Filled 2019-03-04 (×3): qty 1

## 2019-03-04 NOTE — ED Provider Notes (Signed)
[facility] Emergency Department Provider Note  ____________________________________________   First MD Initiated Contact with Patient 03/04/19 [phone]     (approximate)  I have reviewed the triage vital signs and the nursing notes.   HISTORY  Chief Complaint No chief complaint on file.  Level 5 caveat:  history/ROS limited by acute/critical illness  HPI Kevin [name] is a 85 y.o. male from [place] assisted living facility but he was generally doing well and active for his age.  He presents by EMS for evaluation of acute onset fever/chills with some hypoxemia.  He says he had a normal day yesterday but he had "some trouble getting to bed".  He awoke within the last couple of hours with chills and subjective fever and he was breathing faster than usual.  When EMS arrived he was in the low 90s in terms of oxygen saturation until any amount of exertion which dropped him down to the mid 80s.  Once he was on 2 L of oxygen by nasal cannula his oxygen came right back up to the upper 90s.  He is febrile to 101.  He denies any pain, specifically denying headache, neck pain, neck stiffness, chest pain, nausea, vomiting, and abdominal pain as well as dysuria.  He said that he had a little bit of a sore throat yesterday but it is "almost all gone".  He has not been around anyone diagnosed with COVID-19.  About 3 months ago he had surgery to remove 1 of his kidneys due to a renal mass but he has recovered well from that.         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   Cataract cortical, senile    Colon polyps    GERD (gastroesophageal reflux disease)    Hyperlipidemia    Hypertension    Pneumonia    AS A CHILD    Patient Active Problem List   Diagnosis Date Noted   Right renal mass 11/17/2018    Past Surgical History:  Procedure Laterality Date   COLONOSCOPY   2001, 2006, 2012   ESOPHAGOGASTRODUODENOSCOPY  09/11/2000   FLEXIBLE SIGMOIDOSCOPY  03/02/1997   NASAL POLYP EXCISION     ROBOT ASSISTED LAPAROSCOPIC NEPHRECTOMY Right 11/17/2018   Procedure: ROBOTIC ASSISTED LAPAROSCOPIC NEPHRECTOMY;  Surgeon: Hollice Espy, MD;  Location: [facility] ORS;  Service: Urology;  Laterality: Right;    Prior to Admission medications   Medication Sig Start Date End Date Taking? Authorizing Provider  albuterol (PROVENTIL HFA) 108 (90 Base) MCG/ACT inhaler Inhale 2 puffs into the lungs every 6 (six) hours as needed for wheezing or shortness of breath.  02/10/18 02/10/19  [provider]  amLODipine (NORVASC) 5 MG tablet Take 5 mg by mouth daily. 01/22/18   [provider]  docusate sodium (COLACE) 100 MG capsule Take 1 capsule (100 mg total) by mouth 2 (two) times daily. 11/18/18   Hollice Espy, MD  hydrochlorothiazide (HYDRODIURIL) 25 MG tablet Take 25 mg by mouth daily. 01/22/18   [provider]  Multiple Vitamins-Minerals (PRESERVISION AREDS 2 PO) Take 1 tablet by mouth daily.    [provider]  oxyCODONE-acetaminophen (PERCOCET) 5-325 MG tablet Take 1-2 tablets by mouth every 4 (four) hours as needed for moderate pain or severe pain. 11/18/18   Hollice Espy, MD  simvastatin (ZOCOR) 10 MG tablet Take 10  mg by mouth daily. 01/22/18   [provider]    Allergies Patient has no known allergies.  Family History  Problem Relation Age of Onset   Heart disease Mother    Pancreatitis Mother    Emphysema Father    Breast cancer Sister    Breast cancer Sister     Social History Social History   Tobacco Use   Smoking status: Never Smoker   Smokeless tobacco: Never Used  Substance Use Topics   Alcohol use: Never    Frequency: Never   Drug use: Never    Review of Systems Level 5 caveat:  history/ROS limited by acute/critical illness  Constitutional: +fever/chills Eyes: No visual changes. ENT: No sore  throat. Cardiovascular: Denies chest pain. Respiratory: Denies shortness of breath and cough but was hypoxemic for EMS. Gastrointestinal: No abdominal pain.  No nausea, no vomiting.  No diarrhea.  No constipation. Genitourinary: Negative for dysuria. Musculoskeletal: Negative for neck pain.  Negative for back pain. Integumentary: Negative for rash. Neurological: Negative for headaches, focal weakness or numbness.   ____________________________________________   PHYSICAL EXAM:  ED Triage Vitals  Enc Vitals Group     BP 03/04/19 0432 114/62     Pulse Rate 03/04/19 0432 100     Resp 03/04/19 0432 (!) 21     Temp 03/04/19 0446 (!) 101.3 F (38.5 C)     Temp Source 03/04/19 0446 Oral     SpO2 03/04/19 0432 96 %     Weight 03/04/19 0450 81.6 kg (180 lb)     Height 03/04/19 0450 1.854 m (6\' 1" )     Head Circumference --      Peak Flow --      Pain Score 03/04/19 0447 0     Pain Loc --      Pain Edu? --      Excl. in [place]? --      Constitutional: Alert and oriented.  Hard of hearing.  Elderly, appears ill but is nontoxic in appearance at this time. Eyes: Conjunctivae are normal.  Head: Atraumatic. Nose: No congestion/rhinnorhea. Mouth/Throat: Mucous membranes are moist. Neck: No stridor.  No meningeal signs.   Cardiovascular: Mild tachycardia, regular rhythm. Good peripheral circulation. Grossly normal heart sounds. Respiratory: Increased respiratory rate but normal respiratory effort.  No retractions. No audible wheezing. Gastrointestinal: Soft and nontender. No distention.  Musculoskeletal: No lower extremity tenderness nor edema. No gross deformities of extremities. Neurologic:  Normal speech and language. No gross focal neurologic deficits are appreciated.  Skin:  Skin is warm, dry and intact. No rash noted. Psychiatric: Mood and affect are normal. Speech and behavior are normal.  ____________________________________________   LABS (all labs ordered are listed, but only  abnormal results are displayed)  Labs Reviewed  COMPREHENSIVE METABOLIC PANEL - Abnormal; Notable for the following components:      Result Value   Sodium 130 (*)    Potassium 3.1 (*)    Chloride 97 (*)    Glucose, Bld 114 (*)    BUN 26 (*)    Calcium 8.4 (*)    Total Protein 6.4 (*)    GFR calc non Af Amer 54 (*)    All other components within normal limits  PROTIME-INR - Abnormal; Notable for the following components:   Prothrombin Time 20.1 (*)    INR 1.7 (*)    All other components within normal limits  URINALYSIS, COMPLETE (UACMP) WITH MICROSCOPIC - Abnormal; Notable for the following components:   Color,  Urine YELLOW (*)    APPearance CLEAR (*)    Hgb urine dipstick MODERATE (*)    All other components within normal limits  FIBRIN DERIVATIVES D-DIMER ([facility] ONLY) - Abnormal; Notable for the following components:   Fibrin derivatives D-dimer [facility]) 6,231.62 (*)    All other components within normal limits  BLOOD GAS, VENOUS - Abnormal; Notable for the following components:   pH, Ven 7.48 (*)    pCO2, Ven 37 (*)    pO2, Ven 31.0 (*)    Acid-Base Excess 4.0 (*)    All other components within normal limits  CBC WITH DIFFERENTIAL/PLATELET - Abnormal; Notable for the following components:   RBC 3.51 (*)    Hemoglobin 10.9 (*)    HCT 30.5 (*)    Platelets 117 (*)    Lymphs Abs 0.2 (*)    All other components within normal limits  SARS CORONAVIRUS 2 (HOSPITAL ORDER, [facility])  CULTURE, BLOOD (ROUTINE X 2)  CULTURE, BLOOD (ROUTINE X 2)  URINE CULTURE  LACTIC ACID, PLASMA  LIPASE, BLOOD  PROCALCITONIN  LACTATE DEHYDROGENASE  FERRITIN  TRIGLYCERIDES  FIBRINOGEN  CBC WITH DIFFERENTIAL/PLATELET  C-REACTIVE PROTEIN  BRAIN NATRIURETIC PEPTIDE   ____________________________________________  EKG  ED ECG REPORT #1 I, Hinda Kehr, the attending physician, personally viewed and interpreted this ECG.  Date: 03/04/2019 EKG Time: 4:22  AM Rate: 104 Rhythm: Mild sinus tachycardia QRS Axis: normal Intervals: Right bundle branch block and left anterior fascicular block ST/T Wave abnormalities: Non-specific ST segment / T-wave changes, but no clear evidence of acute ischemia. Narrative Interpretation: no definitive evidence of acute ischemia; does not meet STEMI criteria.  The computer is interpreting as acute MI but I think that is because of some of the artifact.  This does not meet criteria for STEMI.  ED ECG REPORT #2 I, Hinda Kehr, the attending physician, personally viewed and interpreted this ECG.  Date: 03/04/2019 EKG Time: 6:22 AM Rate: 84 Rhythm: normal sinus rhythm QRS Axis: normal Intervals: Right bundle branch block and left anterior fascicular block ST/T Wave abnormalities: Non-specific ST segment / T-wave changes, but no clear evidence of acute ischemia. Narrative Interpretation: no definitive evidence of acute ischemia; does not meet STEMI criteria.  Tachycardia has resolved.    ____________________________________________  RADIOLOGY Ursula Alert, personally viewed and evaluated these images (plain radiographs) as part of my medical decision making, as well as reviewing the written report by the radiologist.  ED MD interpretation: Interstitial prominence likely secondary to community-acquired pneumonia without any obvious lobar pneumonia.  Official radiology report(s): Dg Chest Port 1 View  Result Date: 03/04/2019 CLINICAL DATA:  85 year old male with sepsis.  Shortness of breath. EXAM: PORTABLE CHEST 1 VIEW COMPARISON:  Chest CT 06/10/2018. FINDINGS: Portable AP upright view at 0415 hours. Cardiac and mediastinal contours are within normal limits. Visualized tracheal air column is within normal limits. No pneumothorax, pulmonary edema, pleural effusion or consolidation. Mildly increased pulmonary interstitial markings are probably chronic. No confluent opacity identified. Chronic appearing left 7th  rib fracture. No acute osseous abnormality identified. IMPRESSION: No definite acute cardiopulmonary abnormality. Mild increased pulmonary interstitial opacity which is probably chronic. Electronically Signed   By: Genevie Ann M.D.   On: 03/04/2019 05:34    ____________________________________________   PROCEDURES   Procedure(s) performed (including Critical Care):  .Critical Care Performed by: Hinda Kehr, MD Authorized by: Hinda Kehr, MD   Critical care provider statement:    Critical care time (minutes):  30   Critical care time was exclusive of:  Separately billable procedures and treating other patients   Critical care was necessary to treat or prevent imminent or life-threatening deterioration of the following conditions:  Sepsis   Critical care was time spent personally by me on the following activities:  Development of treatment plan with patient or surrogate, discussions with consultants, evaluation of patient's response to treatment, examination of patient, obtaining history from patient or surrogate, ordering and performing treatments and interventions, ordering and review of laboratory studies, ordering and review of radiographic studies, pulse oximetry, re-evaluation of patient's condition and review of old charts     ____________________________________________   [place] / MDM / [place] / ED COURSE  As part of my medical decision making, I reviewed the following data within the [place] notes reviewed and incorporated, Labs reviewed , EKG interpreted , Old chart reviewed, Radiograph reviewed , Discussed with admitting physician (Dr. Sidney Ace) and Notes from prior ED visits      *Kevin [name] was evaluated in Emergency Department on 03/04/2019 for the symptoms described in the history of present illness. He was evaluated in the context of the global COVID-19 pandemic, which necessitated consideration that the patient might  be at risk for infection with the SARS-CoV-2 virus that causes COVID-19. Institutional protocols and algorithms that pertain to the evaluation of patients at risk for COVID-19 are in a state of rapid change based on information released by regulatory bodies including the CDC and federal and state organizations. These policies and algorithms were followed during the patient's care in the ED.*   Clinical Course as of Mar 03 618  Wed Mar 04, 2019  0425 Differential diagnosis includes, but is not limited to, COVID-19, sepsis from any one (or more) of a number of sources (pneumonia, UTI, bacteremia, etc.), less likely strep throat, meningitis, encephalitis.  I just came out of the room from evaluating the patient in person with appropriate PPE.  The patient is not having any headache and does not seem altered.  He is very hard of hearing but he is alert, oriented, and answering questions appropriately.  He is hypoxemic but not short of breath and not using accessory muscles.  His lung sounds are clear.  He denies any pain.  He does look uncomfortable and ill but nontoxic at this time.  He does not need any breathing treatments or advanced airways or airway treatments.  I have ordered both the sepsis work-up and the "preadmission" COVID-19 work-up.  He is on precautions for possible COVID-19 although he has had no known contacts and he lives in an assisted living facility rather than 1 of the skilled nursing facilities that have been having more positive cases.  I am holding off on empiric antibiotics until I see the results of his chest x-ray and urinalysis.  I am giving him a small fluid bolus because he is a little bit tachycardic but I do not want to overload him with fluid given that this seems to be contraindicated for COVID-19 patient's and I do not believe he is going to qualify a severe sepsis or septic shock.  Patient is awake and alert and agrees with the current plan.   [CF]  0505 I have been alerted  by the nurse who monitors code sepsis patients that we have 20 minutes to administer empiric antibiotics.  Unfortunately I still do not have a source and there is no obvious lobar pneumonia on  the chest x-ray and the urinalysis has not yet come back, but I will order empiric antibiotics because that is currently the recommended standard of care and required for meeting the metrics.  Ordering ceftriaxone 2 g IV and azithromycin 500 mg IV as per empiric treatment for probable community-acquired pneumonia.   [CF]  0507 Lactic Acid, Venous: 1.0 [CF]  0520 Still waiting for lab to run the blood tests.  I called and spoke with a lab respresentative and asked why there is a delay. She said they are awaiting a lavendar tube. I asked why they cannot run all of the other tests.  She said they "are getting ready to".  Fifty minutes have passed since the labs were collected.   [CF]  1601 Unremarkable VBG except for more pronounced pO2 than expected.  Blood gas, venous(!!) [CF]  0533 No indication of acute urinary tract infection  Urinalysis, Complete w Microscopic(!) [CF]  0534 SARS Coronavirus 2: NEGATIVE [CF]  0543 WBC: 5.2 [CF]  0544 No definitive infection, radiology interpreted some chronic changes, but I suspect that the increased interstitial opacity actually represents interstitial pneumonia based on patient's clinical presentation.  DG Chest Port 1 View [CF]  [phone] Nonspecific elevation consistent with acute infection/sepsis of unclear origin but the patient is not having any shortness of breath or chest pain.  I do not think it is necessary to obtain a CTA chest to rule out pulmonary embolism; as the order set indicates, the only reason I order it it is because of the initial concern for COVID-19 and it helps as a prognostic tool for the inpatient doctors.  Fibrin derivatives D-dimer [facility])(!): [id number] [CF]  0615 CMP notable for some mild hyponatremia and mild hypokalemia, otherwise essentially normal.     [CF]  3557 I discussed the case by phone with the hospitalist, Dr. Sidney Ace, and we discussed the case in detail.  He will admit.   [CF]    Clinical Course User Index [CF] Hinda Kehr, MD     ____________________________________________  FINAL CLINICAL IMPRESSION(S) / ED DIAGNOSES  Final diagnoses:  Sepsis, due to unspecified organism, unspecified whether acute organ dysfunction present ([place])  Fever, unspecified fever cause  Community acquired pneumonia, unspecified laterality  Hypokalemia     MEDICATIONS GIVEN DURING THIS VISIT:  Medications  cefTRIAXone (ROCEPHIN) 2 g in sodium chloride 0.9 % 100 mL IVPB (0 g Intravenous Stopped 03/04/19 0617)  azithromycin (ZITHROMAX) 500 mg in sodium chloride 0.9 % 250 mL IVPB (500 mg Intravenous New Bag/Given 03/04/19 0514)  sodium chloride 0.9 % bolus 500 mL (500 mLs Intravenous New Bag/Given 03/04/19 0455)     ED Discharge Orders    None       Note:  This document was prepared using Dragon voice recognition software and may include unintentional dictation errors.   Hinda Kehr, MD 03/04/19 [phone]

## 2019-03-04 NOTE — ED Notes (Signed)
Patient transported to CT 

## 2019-03-04 NOTE — Progress Notes (Signed)
CODE SEPSIS - PHARMACY COMMUNICATION  **Broad Spectrum Antibiotics should be administered within 1 hour of Sepsis diagnosis**  Time Code Sepsis Called/Page Received: @ 0416  Antibiotics Ordered: ceftriaxone and azithromycin   Time of 1st antibiotic administration: @ 0413  Additional action taken by pharmacy: Spoke with RN @ [phone] about time left to start Abx.  Communicated with MD @ [phone] about ordering abxs.   Pernell Dupre, PharmD, BCPS Clinical Pharmacist 03/04/2019 5:35 AM

## 2019-03-04 NOTE — ED Notes (Signed)
ED TO INPATIENT HANDOFF REPORT  ED Nurse Name and Phone #:  S Name/Age/Gender Kevin [name] 85 y.o. male Room/Bed: ED06A/ED06A  Code Status   Code Status: Full Code  Home/SNF/Other Home (assisted living apartment with wife) Patient oriented to: self, place, time and situation Is this baseline? Yes   Triage Complete: Triage complete  Chief Complaint [facility] - Fever  Triage Note Per EMS pt feeling bad since Tues am, worsening thru night. This am very short of breath. On arrivial sats 88-94% RA. Resides with wife at [facility] assisted Living apartments. Pt is very HOH and is Alert & Oriented x 4   Allergies No Known Allergies  Level of Care/Admitting Diagnosis ED Disposition    ED Disposition Condition [facility] Area: [place] [[id number]]  Level of Care: Med-Surg [16]  Covid Evaluation: N/A  Diagnosis: CAP (community acquired pneumonia) [774128]  Admitting Physician: Christel Mormon [[id number]]  Attending Physician: Christel Mormon [[id number]]  Estimated length of stay: past midnight tomorrow  Certification:: I certify this patient will need inpatient services for at least 2 midnights  PT Class (Do Not Modify): Inpatient [101]  PT Acc Code (Do Not Modify): Private [1]       B Medical/Surgery History Past Medical History:  Diagnosis Date  . Asthma   . Broken arm    Pt was 19  . 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 Cataract cortical, senile   . Colon polyps   . GERD (gastroesophageal reflux disease)   . Hyperlipidemia   . Hypertension   . Pneumonia    AS A CHILD   Past Surgical History:  Procedure Laterality Date  . COLONOSCOPY  2001, 2006, 2012  . ESOPHAGOGASTRODUODENOSCOPY  09/11/2000  . FLEXIBLE SIGMOIDOSCOPY  03/02/1997  . NASAL POLYP EXCISION    . ROBOT ASSISTED LAPAROSCOPIC NEPHRECTOMY Right 11/17/2018   Procedure: ROBOTIC ASSISTED LAPAROSCOPIC NEPHRECTOMY;  Surgeon: Hollice Espy, MD;  Location: [facility] ORS;   Service: Urology;  Laterality: Right;     A IV Location/Drains/Wounds Patient Lines/Drains/Airways Status   Active Line/Drains/Airways    Name:   Placement date:   Placement time:   Site:   Days:   Peripheral IV 03/04/19 Right Antecubital   03/04/19    0420    Antecubital   less than 1   Peripheral IV 03/04/19 Left Antecubital   03/04/19    0420    Antecubital   less than 1   Peripheral IV 03/04/19 Right Hand   03/04/19    0430    Hand   less than 1   Incision (Closed) 11/17/18 Abdomen   11/17/18    1101     107   Incision - 6 Ports Abdomen 1: Right;Lower 2: Right;Medial 3: Right;Upper 4: Right;Upper 5: Umbilicus   28/36/62    -     107          Intake/Output Last 24 hours  Intake/Output Summary (Last 24 hours) at 03/04/2019 0648 Last data filed at 03/04/2019 0442 Gross per 24 hour  Intake 200 ml  Output -  Net 200 ml    Labs/Imaging Results for orders placed or performed during the hospital encounter of 03/04/19 (from the past 48 hour(s))  Comprehensive metabolic panel     Status: Abnormal   Collection Time: 03/04/19  4:13 AM  Result Value Ref Range   Sodium 130 (L) 135 - 145 mmol/L   Potassium 3.1 (  L) 3.5 - 5.1 mmol/L   Chloride 97 (L) 98 - 111 mmol/L   CO2 24 22 - 32 mmol/L   Glucose, Bld 114 (H) 70 - 99 mg/dL   BUN 26 (H) 8 - 23 mg/dL   Creatinine, Ser 1.22 0.61 - 1.24 mg/dL   Calcium 8.4 (L) 8.9 - 10.3 mg/dL   Total Protein 6.4 (L) 6.5 - 8.1 g/dL   Albumin 3.6 3.5 - 5.0 g/dL   AST 21 15 - 41 U/L   ALT 20 0 - 44 U/L   Alkaline Phosphatase 44 38 - 126 U/L   Total Bilirubin 1.0 0.3 - 1.2 mg/dL   GFR calc non Af Amer 54 (L) >60 mL/min   GFR calc Af Amer >60 >60 mL/min   Anion gap 9 5 - 15    Comment: Performed at [facility], [address] [place], [place] [address]  Lipase, blood     Status: None   Collection Time: 03/04/19  4:13 AM  Result Value Ref Range   Lipase 28 11 - 51 U/L    Comment: Performed at [facility], [address] [place], [place] [address]  Procalcitonin     Status: None   Collection Time: 03/04/19  4:13 AM  Result Value Ref Range   Procalcitonin 0.32 ng/mL    Comment:        Interpretation: PCT (Procalcitonin) <= 0.5 ng/mL: Systemic infection (sepsis) is not likely. Local bacterial infection is possible. (NOTE)       Sepsis PCT Algorithm           Lower Respiratory Tract                                      Infection PCT Algorithm    ----------------------------     ----------------------------         PCT < 0.25 ng/mL                PCT < 0.10 ng/mL         Strongly encourage             Strongly discourage   discontinuation of antibiotics    initiation of antibiotics    ----------------------------     -----------------------------       PCT 0.25 - 0.50 ng/mL            PCT 0.10 - 0.25 ng/mL               OR       >80% decrease in PCT            Discourage initiation of                                            antibiotics      Encourage discontinuation           of antibiotics    ----------------------------     -----------------------------         PCT >= 0.50 ng/mL              PCT 0.26 - 0.50 ng/mL               AND        <80% decrease in PCT  Encourage initiation of                                             antibiotics       Encourage continuation           of antibiotics    ----------------------------     -----------------------------        PCT >= 0.50 ng/mL                  PCT > 0.50 ng/mL               AND         increase in PCT                  Strongly encourage                                      initiation of antibiotics    Strongly encourage escalation           of antibiotics                                     -----------------------------                                           PCT <= 0.25 ng/mL                                                 OR                                        > 80% decrease in PCT                                      Discontinue / Do not initiate                                             antibiotics Performed at [facility], [address] [place], [place] [address]   Protime-INR     Status: Abnormal   Collection Time: 03/04/19  4:13 AM  Result Value Ref Range   Prothrombin Time 20.1 (H) 11.4 - 15.2 seconds   INR 1.7 (H) 0.8 - 1.2    Comment: (NOTE) INR goal varies based on device and disease states. Performed at [facility], [address] [place], [place] [address]   Fibrin derivatives D-Dimer     Status: Abnormal   Collection Time: 03/04/19  4:13 AM  Result Value Ref Range   Fibrin derivatives D-dimer ([facility]) 6,231.62 (H) 0.00 - 499.00 ng/mL (FEU)    Comment: (NOTE) <> Exclusion of Venous Thromboembolism (  VTE) - OUTPATIENT ONLY   (Emergency Department or [place])   0-499 ng/ml (FEU): With a low to intermediate pretest probability                      for VTE this test result excludes the diagnosis                      of VTE.   >499 ng/ml (FEU) : VTE not excluded; additional work up for VTE is                      required. <> Testing on Inpatients and Evaluation of Disseminated Intravascular   Coagulation (DIC) Reference Range:   0-499 ng/ml (FEU) Performed at [facility], [address] [place], [place] [address]   Lactate dehydrogenase     Status: None   Collection Time: 03/04/19  4:13 AM  Result Value Ref Range   LDH 104 98 - 192 U/L    Comment: Performed at [facility], [address] [place], [place] [address]  Ferritin     Status: None   Collection Time: 03/04/19  4:13 AM  Result Value Ref Range   Ferritin 282 24 - 336 ng/mL    Comment: Performed at [facility] [address] [place], [place] [address]  Fibrinogen     Status: None   Collection Time: 03/04/19  4:13 AM  Result Value Ref Range   Fibrinogen 331 210 - 475 mg/dL    Comment: Performed at [facility], [address] [place], [place] [address]   Lactic acid, plasma     Status: None   Collection Time: 03/04/19  4:22 AM  Result Value Ref Range   Lactic Acid, Venous 1.0 0.5 - 1.9 mmol/L    Comment: Performed at [facility], [address], [place], [place] [address]  Blood Culture (routine x 2)     Status: None (Preliminary result)   Collection Time: 03/04/19  4:22 AM  Result Value Ref Range   Specimen Description BLOOD LEFT ANTECUBITAL    Special Requests      BOTTLES DRAWN AEROBIC AND ANAEROBIC Blood Culture results may not be optimal due to an excessive volume of blood received in culture bottles   Culture      NO GROWTH <12 HOURS Performed at [facility], [address], [place], [place] [address]    Report Status PENDING   Blood Culture (routine x 2)     Status: None (Preliminary result)   Collection Time: 03/04/19  4:22 AM  Result Value Ref Range   Specimen Description BLOOD BLOOD RIGHT HAND    Special Requests      BOTTLES DRAWN AEROBIC AND ANAEROBIC Blood Culture results may not be optimal due to an excessive volume of blood received in culture bottles   Culture      NO GROWTH <12 HOURS Performed at [facility], [address] [place], [place] [address]    Report Status PENDING   Urinalysis, Complete w Microscopic     Status: Abnormal   Collection Time: 03/04/19  4:22 AM  Result Value Ref Range   Color, Urine YELLOW (A) YELLOW   APPearance CLEAR (A) CLEAR   Specific Gravity, Urine 1.016 1.005 - 1.030   pH 6.0 5.0 - 8.0   Glucose, UA NEGATIVE NEGATIVE mg/dL   Hgb urine dipstick MODERATE (A) NEGATIVE   Bilirubin Urine NEGATIVE  NEGATIVE   Ketones, ur NEGATIVE NEGATIVE mg/dL   Protein, ur NEGATIVE NEGATIVE mg/dL   Nitrite NEGATIVE NEGATIVE   Leukocytes,Ua NEGATIVE NEGATIVE   RBC / HPF 6-10 0 - 5 RBC/hpf   WBC, UA 0-5 0 - 5 WBC/hpf   Bacteria, UA NONE SEEN NONE SEEN   Squamous Epithelial / LPF 0-5 0 - 5    Comment: Performed at [facility], [address],  [place], [place] [address]  SARS Coronavirus 2 [facility] order, Performed in [place] hospital lab)     Status: None   Collection Time: 03/04/19  4:22 AM  Result Value Ref Range   SARS Coronavirus 2 NEGATIVE NEGATIVE    Comment: (NOTE) If result is NEGATIVE SARS-CoV-2 target nucleic acids are NOT DETECTED. The SARS-CoV-2 RNA is generally detectable in upper and lower  respiratory specimens during the acute phase of infection. The lowest  concentration of SARS-CoV-2 viral copies this assay can detect is 250  copies / mL. A negative result does not preclude SARS-CoV-2 infection  and should not be used as the sole basis for treatment or other  patient management decisions.  A negative result may occur with  improper specimen collection / handling, submission of specimen other  than nasopharyngeal swab, presence of viral mutation(s) within the  areas targeted by this assay, and inadequate number of viral copies  (<250 copies / mL). A negative result must be combined with clinical  observations, patient history, and epidemiological information. If result is POSITIVE SARS-CoV-2 target nucleic acids are DETECTED. The SARS-CoV-2 RNA is generally detectable in upper and lower  respiratory specimens dur ing the acute phase of infection.  Positive  results are indicative of active infection with SARS-CoV-2.  Clinical  correlation with patient history and other diagnostic information is  necessary to determine patient infection status.  Positive results do  not rule out bacterial infection or co-infection with other viruses. If result is PRESUMPTIVE POSTIVE SARS-CoV-2 nucleic acids MAY BE PRESENT.   A presumptive positive result was obtained on the submitted specimen  and confirmed on repeat testing.  While 2019 novel coronavirus  (SARS-CoV-2) nucleic acids may be present in the submitted sample  additional confirmatory testing may be necessary for epidemiological  and / or clinical management  purposes  to differentiate between  SARS-CoV-2 and other Sarbecovirus currently known to infect humans.  If clinically indicated additional testing with an alternate test  methodology [phone]) is advised. The SARS-CoV-2 RNA is generally  detectable in upper and lower respiratory sp ecimens during the acute  phase of infection. The expected result is Negative. Fact Sheet for Patients:  StrictlyIdeas.no Fact Sheet for Healthcare Providers: BankingDealers.co.za This test is not yet approved or cleared by the [place] FDA and has been authorized for detection and/or diagnosis of SARS-CoV-2 by FDA under an Emergency Use Authorization (EUA).  This EUA will remain in effect (meaning this test can be used) for the duration of the COVID-19 declaration under Section 564(b)(1) of the Act, 21 U.S.C. section 360bbb-3(b)(1), unless the authorization is terminated or revoked sooner. Performed at [facility], [address] [place], [place] [address]   Triglycerides     Status: None   Collection Time: 03/04/19  4:22 AM  Result Value Ref Range   Triglycerides 30 <150 mg/dL    Comment: Performed at [facility], [address] [place], [place] [address]  Blood gas, venous     Status: Abnormal   Collection Time: 03/04/19  4:25 AM  Result Value Ref Range   pH, Ven 7.48 (H) 7.250 - 7.430   pCO2, Ven 37 (L) 44.0 - 60.0 mmHg   pO2, Ven 31.0 (LL) 32.0 - 45.0 mmHg    Comment: CRITICAL RESULT CALLED TO, READ BACK BY AND VERIFIED WITH: JENNA, RN AT 0502 ON 03/04/2019    Bicarbonate 27.6 20.0 - 28.0 mmol/L   Acid-Base Excess 4.0 (H) 0.0 - 2.0 mmol/L   O2 Saturation 65.5 %   Patient temperature 37.0    Collection site VEIN    Sample type VENOUS     Comment: Performed at [facility], [address] [place], [place] [address]  Brain natriuretic peptide     Status: None   Collection Time: 03/04/19  5:20 AM  Result  Value Ref Range   B Natriuretic Peptide 88.0 0.0 - 100.0 pg/mL    Comment: Performed at [facility], [address] [place], [place] [address]  CBC with Differential/Platelet     Status: Abnormal   Collection Time: 03/04/19  5:20 AM  Result Value Ref Range   WBC 5.2 4.0 - 10.5 K/uL   RBC 3.51 (L) 4.22 - 5.81 MIL/uL   Hemoglobin 10.9 (L) 13.0 - 17.0 g/dL   HCT 30.5 (L) 39.0 - 52.0 %   MCV 86.9 80.0 - 100.0 fL   MCH 31.1 26.0 - 34.0 pg   MCHC 35.7 30.0 - 36.0 g/dL   RDW 12.2 11.5 - 15.5 %   Platelets 117 (L) 150 - 400 K/uL    Comment: Immature Platelet Fraction may be clinically indicated, consider ordering this additional test [id number]    nRBC 0.0 0.0 - 0.2 %   Neutrophils Relative % 87 %   Neutro Abs 4.5 1.7 - 7.7 K/uL   Lymphocytes Relative 4 %   Lymphs Abs 0.2 (L) 0.7 - 4.0 K/uL   Monocytes Relative 8 %   Monocytes Absolute 0.4 0.1 - 1.0 K/uL   Eosinophils Relative 0 %   Eosinophils Absolute 0.0 0.0 - 0.5 K/uL   Basophils Relative 0 %   Basophils Absolute 0.0 0.0 - 0.1 K/uL   Immature Granulocytes 1 %   Abs Immature Granulocytes 0.04 0.00 - 0.07 K/uL    Comment: Performed at [facility], [address] [place], [place] [address]   Dg Chest Port 1 View  Result Date: 03/04/2019 CLINICAL DATA:  85 year old male with sepsis.  Shortness of breath. EXAM: PORTABLE CHEST 1 VIEW COMPARISON:  Chest CT 06/10/2018. FINDINGS: Portable AP upright view at 0415 hours. Cardiac and mediastinal contours are within normal limits. Visualized tracheal air column is within normal limits. No pneumothorax, pulmonary edema, pleural effusion or consolidation. Mildly increased pulmonary interstitial markings are probably chronic. No confluent opacity identified. Chronic appearing left 7th rib fracture. No acute osseous abnormality identified. IMPRESSION: No definite acute cardiopulmonary abnormality. Mild increased pulmonary interstitial opacity which is probably chronic.  Electronically Signed   By: Genevie Ann M.D.   On: 03/04/2019 05:34    Pending Labs Unresulted Labs (From admission, onward)    Start     Ordered   03/05/19 0962  Basic metabolic panel  Tomorrow morning,   STAT     03/04/19 0638   03/05/19 0500  CBC  Tomorrow morning,   STAT     03/04/19 0638   03/04/19 0627  HIV antibody (Routine Screening)  Once,   STAT     03/04/19 8366   03/04/19 0627  Culture, blood (  routine x 2) Call MD if unable to obtain prior to antibiotics being given  BLOOD CULTURE X 2,   STAT    Comments:  If blood cultures drawn in Emergency Department - Do not draw and cancel order    03/04/19 0638   03/04/19 0627  Culture, sputum-assessment  Once,   STAT     03/04/19 0638   03/04/19 0627  Gram stain  Once,   STAT     03/04/19 0638   03/04/19 0627  Strep pneumoniae urinary antigen  Once,   STAT     03/04/19 0638   03/04/19 0623  Troponin I - Add-On to previous collection  Add-on,   AD     03/04/19 0622   03/04/19 0414  C-reactive protein  Once,   STAT     03/04/19 0414   03/04/19 0413  CBC WITH DIFFERENTIAL  ONCE - STAT,   STAT     03/04/19 0414   03/04/19 0413  Urine culture  Add-on,   AD     03/04/19 0414          Vitals/Pain Today's Vitals   03/04/19 0446 03/04/19 0447 03/04/19 0450 03/04/19 0500  BP: 114/62   (!) 108/58  Pulse: 97  97 94  Resp: 18  19 17   Temp: (!) 101.3 F (38.5 C)     TempSrc: Oral     SpO2: 96%  96% 93%  Weight:   81.6 kg   Height:   6\' 1"  (1.854 m)   PainSc:  0-No pain      Isolation Precautions No active isolations  Medications Medications  enoxaparin (LOVENOX) injection 40 mg (has no administration in time range)  0.9 %  sodium chloride infusion (has no administration in time range)  acetaminophen (TYLENOL) tablet 650 mg (has no administration in time range)    Or  acetaminophen (TYLENOL) suppository 650 mg (has no administration in time range)  traZODone (DESYREL) tablet 25 mg (has no administration in time range)   magnesium hydroxide (MILK OF MAGNESIA) suspension 30 mL (has no administration in time range)  promethazine (PHENERGAN) tablet 12.5 mg (has no administration in time range)  azithromycin (ZITHROMAX) 500 mg in sodium chloride 0.9 % 250 mL IVPB (has no administration in time range)  cefTRIAXone (ROCEPHIN) 2 g in sodium chloride 0.9 % 100 mL IVPB (has no administration in time range)  guaiFENesin (MUCINEX) 12 hr tablet 600 mg (has no administration in time range)  ipratropium-albuterol (DUONEB) 0.5-2.5 (3) MG/3ML nebulizer solution 3 mL (has no administration in time range)  sodium chloride 0.9 % bolus 500 mL (0 mLs Intravenous Stopped 03/04/19 0626)    Mobility walks Moderate fall risk   Focused Assessments Sepsis   R Recommendations: See Admitting Provider Note  Report given to:   Additional Notes:

## 2019-03-04 NOTE — Progress Notes (Addendum)
And for fever, weakness, COVID-19 test has been negative in the emergency room, getting antibiotics for clinical pneumonia /mild /hypokalemia.sepsis agree with H&P, medications, lab work.

## 2019-03-04 NOTE — H&P (Signed)
[facility] at [place] NAME: Kevin [name]    MR#:  [id number]  DATE OF BIRTH:  [birth year]  DATE OF ADMISSION:  03/04/2019  PRIMARY CARE PHYSICIAN: Baxter Hire, MD   REQUESTING/REFERRING PHYSICIAN: Hinda Kehr, MD CHIEF COMPLAINT:  Fever, weakness  HISTORY OF PRESENT ILLNESS:  Kevin [name]  is a 85 y.o. Caucasian male with a known history of multiple medical problems that will be mentioned below, who presented to the emergency room with a concern of fever at home with associated chills that started last night as well as generalized fatigue and weakness.  He stated he could not walk.  He was noted to be hypoxic by EMS.  He was breathing faster than usual.  Pulse oximetry was down to the mid 80s and on 2 L it came up to the upper 90s.  He denied any chest pain or palpitations.  No neck pain or stiffness.  No nausea vomiting or abdominal pain.  He admitted to sore throat yesterday that is currently gone.  No sick exposure or recent travels.  He has been staying home.  Upon presentation to the emergency room he was febrile with a temperature of 101.3 and heart rate was 100 with a respiratory rate of 21 approximately 96% on room air.  Labs were otherwise unremarkable.  D-dimer was elevated.  Urinalysis was unremarkable.  COVID-19 test came back negative.  Chest x-ray showed no acute cardiopulmonary disease.  Showed bilateral interstitial opacities that are likely chronic.  The patient had 2 blood cultures drawn and was given IV Rocephin and Zithromax.  He will be admitted to a medical monitored bed for further evaluation and management.   PAST MEDICAL HISTORY:   Past Medical History:  Diagnosis Date  . Asthma   . Broken arm    Pt was 19  . Cancer ([place]) 07/2018   RENAL, 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  . Cataract cortical, senile   . Colon polyps   . GERD (gastroesophageal reflux disease)   . Hyperlipidemia   . Hypertension   . Pneumonia    AS A CHILD     PAST SURGICAL HISTORY:   Past Surgical History:  Procedure Laterality Date  . COLONOSCOPY  2001, 2006, 2012  . ESOPHAGOGASTRODUODENOSCOPY  09/11/2000  . FLEXIBLE SIGMOIDOSCOPY  03/02/1997  . NASAL POLYP EXCISION    . ROBOT ASSISTED LAPAROSCOPIC NEPHRECTOMY Right 11/17/2018   Procedure: ROBOTIC ASSISTED LAPAROSCOPIC NEPHRECTOMY;  Surgeon: Hollice Espy, MD;  Location: [facility] ORS;  Service: Urology;  Laterality: Right;    SOCIAL HISTORY:   Social History   Tobacco Use  . Smoking status: Never Smoker  . Smokeless tobacco: Never Used  Substance Use Topics  . Alcohol use: Never    Frequency: Never    FAMILY HISTORY:   Family History  Problem Relation Age of Onset  . Heart disease Mother   . Pancreatitis Mother   . Emphysema Father   . Breast cancer Sister   . Breast cancer Sister     DRUG ALLERGIES:  No Known Allergies  REVIEW OF SYSTEMS:   ROS As per history of present illness. All pertinent systems were reviewed above. Constitutional,  HEENT, cardiovascular, respiratory, GI, GU, musculoskeletal, neuro, psychiatric, endocrine,  integumentary and hematologic systems were reviewed and are otherwise  negative/unremarkable except for positive findings mentioned above in the HPI.   MEDICATIONS AT HOME:   Prior to Admission medications   Medication Sig Start Date End Date Taking?  Authorizing Provider  albuterol (PROVENTIL HFA) 108 (90 Base) MCG/ACT inhaler Inhale 2 puffs into the lungs every 6 (six) hours as needed for wheezing or shortness of breath.  02/10/18 02/10/19  [provider]  amLODipine (NORVASC) 5 MG tablet Take 5 mg by mouth daily. 01/22/18   [provider]  docusate sodium (COLACE) 100 MG capsule Take 1 capsule (100 mg total) by mouth 2 (two) times daily. 11/18/18   Hollice Espy, MD  hydrochlorothiazide (HYDRODIURIL) 25 MG tablet Take 25 mg by mouth daily. 01/22/18   [provider]  Multiple Vitamins-Minerals (PRESERVISION AREDS 2  PO) Take 1 tablet by mouth daily.    [provider]  oxyCODONE-acetaminophen (PERCOCET) 5-325 MG tablet Take 1-2 tablets by mouth every 4 (four) hours as needed for moderate pain or severe pain. 11/18/18   Hollice Espy, MD  simvastatin (ZOCOR) 10 MG tablet Take 10 mg by mouth daily. 01/22/18   [provider]      VITAL SIGNS:  Blood pressure (!) 108/58, pulse 94, temperature (!) 101.3 F (38.5 C), temperature source Oral, resp. rate 17, height 6\' 1"  (1.854 m), weight 81.6 kg, SpO2 93 %.  PHYSICAL EXAMINATION:  Physical Exam  GENERAL:  85 y.o.-year-old Caucasian male patient lying in the bed in mild respiratory distress with conversational dyspnea. EYES: Pupils equal, round, reactive to light and accommodation. No scleral icterus. Extraocular muscles intact.  HEENT: Head atraumatic, normocephalic. Oropharynx and nasopharynx clear.  NECK:  Supple, no jugular venous distention. No thyroid enlargement, no tenderness.  LUNGS: Diminished bibasilar breath sounds with bibasal crackles. CARDIOVASCULAR: Regular rate and rhythm, S1, S2 normal. No murmurs, rubs, or gallops.  ABDOMEN: Soft, nondistended, nontender. Bowel sounds present. No organomegaly or mass.  EXTREMITIES: No pedal edema, cyanosis, or clubbing.  NEUROLOGIC: Cranial nerves II through XII are intact. Muscle strength 5/5 in all extremities. Sensation intact. Gait not checked.  PSYCHIATRIC: The patient is alert and oriented x 3.  Normal affect and good eye contact. SKIN: Warm and dry with no obvious rash, lesion, or ulcer.   LABORATORY PANEL:   CBC Recent Labs  Lab 03/04/19 0520  WBC 5.2  HGB 10.9*  HCT 30.5*  PLT 117*   ------------------------------------------------------------------------------------------------------------------  Chemistries  Recent Labs  Lab 03/04/19 0413  NA 130*  K 3.1*  CL 97*  CO2 24  GLUCOSE 114*  BUN 26*  CREATININE 1.22  CALCIUM 8.4*  AST 21  ALT 20  ALKPHOS 44   BILITOT 1.0   ------------------------------------------------------------------------------------------------------------------  Cardiac Enzymes No results for input(s): TROPONINI in the last 168 hours. ------------------------------------------------------------------------------------------------------------------  RADIOLOGY:  Dg Chest Port 1 View  Result Date: 03/04/2019 CLINICAL DATA:  85 year old male with sepsis.  Shortness of breath. EXAM: PORTABLE CHEST 1 VIEW COMPARISON:  Chest CT 06/10/2018. FINDINGS: Portable AP upright view at 0415 hours. Cardiac and mediastinal contours are within normal limits. Visualized tracheal air column is within normal limits. No pneumothorax, pulmonary edema, pleural effusion or consolidation. Mildly increased pulmonary interstitial markings are probably chronic. No confluent opacity identified. Chronic appearing left 7th rib fracture. No acute osseous abnormality identified. IMPRESSION: No definite acute cardiopulmonary abnormality. Mild increased pulmonary interstitial opacity which is probably chronic. Electronically Signed   By: Genevie Ann M.D.   On: 03/04/2019 05:34      IMPRESSION AND PLAN:   #1.  Clinical community-acquired pneumonia.The patient will be admitted to a medically monitored bed for community-acquired pneumonia and will be placed on IV Rocephin and Zithromax.  Mucolytic therapy  be provided as well as duo nebs q.i.d. and q.4 hours p.r.n.Marland Kitchen  Sputum Gram stain culture and sensitivity will be obtained.  2.  Mild sepsis, likely secondary to #1.  Will follow blood and sputum cultures.  We will continue hydration with IV normal saline especially given mild hyponatremia that is likely hypovolemic..  3.  Elevated d-dimer.  I ordered a stat chest CTA to rule out PE and further assess for pneumonia.  4.  Hypokalemia.  Replace potassium and check magnesium level.  5.  Hypertension.  Will continue amlodipine and hold off HCTZ.  6.  Dyslipidemia.   Will continue statin therapy.  7.  DVT prophylaxis.  Subcutaneous Lovenox.  All the records are reviewed and case discussed with ED provider. The plan of care was discussed in details with the patient (and family). I answered all questions. The patient agreed to proceed with the above mentioned plan. Further management will depend upon hospital course.   CODE STATUS: Full code  TOTAL TIME TAKING CARE OF THIS PATIENT: 55 minutes.    Christel Mormon M.D on 03/04/2019 at 6:52 AM  Pager - [phone]  After 6pm go to [email or web address] - Proofreader  [facility]  Office  [phone]  CC: Primary care physician; Baxter Hire, MD   Note: This dictation was prepared with Dragon dictation along with smaller phrase technology. Any transcriptional errors that result from this process are unintentional.

## 2019-03-04 NOTE — ED Notes (Signed)
MD at bedside. 

## 2019-03-04 NOTE — ED Triage Notes (Signed)
Per EMS pt feeling bad since Tues am, worsening thru night. This am very short of breath. On arrivial sats 88-94% RA. Resides with wife at [facility]. Pt is very HOH and is Alert & Oriented x 4

## 2019-03-04 NOTE — ED Notes (Addendum)
Wife [phone]   [phone]

## 2019-03-04 NOTE — Progress Notes (Signed)
[facility] CRITICAL VALUE ALERT - BLOOD CULTURE IDENTIFICATION (BCID)  Kevin [name] is an 85 y.o. male who presented to [facility] on 03/04/2019  Assessment:  2/4 bottles (anaerobic) E. coli  Name of physician (or Provider) Contacted: Dr. Vianne Bulls  Current antibiotics: Rocephin 2 g IV q24h  Changes to prescribed antibiotics recommended: continue Rocephin  Results for orders placed or performed during the hospital encounter of 03/04/19  Blood Culture ID Panel (Reflexed) (Collected: 03/04/2019  4:22 AM)  Result Value Ref Range   Enterococcus species NOT DETECTED NOT DETECTED   Listeria monocytogenes NOT DETECTED NOT DETECTED   Staphylococcus species NOT DETECTED NOT DETECTED   Staphylococcus aureus (BCID) NOT DETECTED NOT DETECTED   Streptococcus species NOT DETECTED NOT DETECTED   Streptococcus agalactiae NOT DETECTED NOT DETECTED   Streptococcus pneumoniae NOT DETECTED NOT DETECTED   Streptococcus pyogenes NOT DETECTED NOT DETECTED   Acinetobacter baumannii NOT DETECTED NOT DETECTED   Enterobacteriaceae species DETECTED (A) NOT DETECTED   Enterobacter cloacae complex NOT DETECTED NOT DETECTED   Escherichia coli DETECTED (A) NOT DETECTED   Klebsiella oxytoca NOT DETECTED NOT DETECTED   Klebsiella pneumoniae NOT DETECTED NOT DETECTED   Proteus species NOT DETECTED NOT DETECTED   Serratia marcescens NOT DETECTED NOT DETECTED   Carbapenem resistance NOT DETECTED NOT DETECTED   Haemophilus influenzae NOT DETECTED NOT DETECTED   Neisseria meningitidis NOT DETECTED NOT DETECTED   Pseudomonas aeruginosa NOT DETECTED NOT DETECTED   Candida albicans NOT DETECTED NOT DETECTED   Candida glabrata NOT DETECTED NOT DETECTED   Candida krusei NOT DETECTED NOT DETECTED   Candida parapsilosis NOT DETECTED NOT DETECTED   Candida tropicalis NOT DETECTED NOT DETECTED    Tawnya Crook, PharmD Pharmacy Resident  03/04/2019 3:49 PM

## 2019-03-05 LAB — BASIC METABOLIC PANEL
Anion gap: 8 (ref 5–15)
BUN: 17 mg/dL (ref 8–23)
CO2: 21 mmol/L — ABNORMAL LOW (ref 22–32)
Calcium: 7.9 mg/dL — ABNORMAL LOW (ref 8.9–10.3)
Chloride: 102 mmol/L (ref 98–111)
Creatinine, Ser: 0.95 mg/dL (ref 0.61–1.24)
GFR calc Af Amer: >60 mL/min (ref >60)
GFR calc non Af Amer: >60 mL/min (ref >60)
Glucose, Bld: 96 mg/dL (ref 70–99)
Potassium: 3.4 mmol/L — ABNORMAL LOW (ref 3.5–5.1)
Sodium: 131 mmol/L — ABNORMAL LOW (ref 135–145)

## 2019-03-05 LAB — CBC
HCT: 33.8 % — ABNORMAL LOW (ref 39.0–52.0)
Hemoglobin: 11.8 g/dL — ABNORMAL LOW (ref 13.0–17.0)
MCH: 30.4 pg (ref 26.0–34.0)
MCHC: 34.9 g/dL (ref 30.0–36.0)
MCV: 87.1 fL (ref 80.0–100.0)
Platelets: 121 10*3/uL — ABNORMAL LOW (ref 150–400)
RBC: 3.88 MIL/uL — ABNORMAL LOW (ref 4.22–5.81)
RDW: 12.4 % (ref 11.5–15.5)
WBC: 6.6 10*3/uL (ref 4.0–10.5)
nRBC: 0 % (ref 0.0–0.2)

## 2019-03-05 LAB — EXPECTORATED SPUTUM ASSESSMENT W GRAM STAIN, RFLX TO RESP C

## 2019-03-05 LAB — URINE CULTURE: Culture: NO GROWTH

## 2019-03-05 MED ORDER — POTASSIUM CHLORIDE 20 MEQ PO PACK
40.0000 meq | PACK | Freq: Once | ORAL | Status: AC
  Administered 2019-03-05: 16:00:00 40 meq via ORAL
  Filled 2019-03-05: qty 2

## 2019-03-05 NOTE — Progress Notes (Signed)
[place] at [place] NAME: Kevin [name]    MR#:  [id number]  DATE OF BIRTH:  05-02-1933  SUBJECTIVE:  patient admitted for fever, weakness, clinical pneumonia.  No further fever.  He has some cough and phlegm but he says overall he is feeling better.  Patient is very hard of hearing, blood culture showed gram-negative rods in both bottles, final sensitivity results are pending.  CHIEF COMPLAINT:  No chief complaint on file.   REVIEW OF SYSTEMS:   ROS CONSTITUTIONAL: No fever, fatigue or weakness.  EYES: No blurred or double vision.  EARS, NOSE, AND THROAT: No tinnitus or ear pain.  RESPIRATORY: Has cough, phlegm.Marland Kitchen  CARDIOVASCULAR: No chest pain, orthopnea, edema.  GASTROINTESTINAL: No nausea, vomiting, diarrhea or abdominal pain.  GENITOURINARY: No dysuria, hematuria.  ENDOCRINE: No polyuria, nocturia,  HEMATOLOGY: No anemia, easy bruising or bleeding SKIN: No rash or lesion. MUSCULOSKELETAL: No joint pain or arthritis.   NEUROLOGIC: No tingling, numbness, weakness.  PSYCHIATRY: No anxiety or depression.   DRUG ALLERGIES:  No Known Allergies  VITALS:  Blood pressure (!) 154/83, pulse 80, temperature 97.9 F (36.6 C), resp. rate 19, height 6\' 1"  (1.854 m), weight 81.6 kg, SpO2 96 %.  PHYSICAL EXAMINATION:  GENERAL:  85 y.o.-year-old patient lying in the bed with no acute distress.  EYES: Pupils equal, round, reactive to light and accommodation. No scleral icterus. Extraocular muscles intact.  HEENT: Head atraumatic, normocephalic. Oropharynx and nasopharynx clear.  NECK:  Supple, no jugular venous distention. No thyroid enlargement, no tenderness.  LUNGS: Clear to auscultation bilaterally.  No wheezing. CARDIOVASCULAR: S1, S2 normal. No murmurs, rubs, or gallops.  ABDOMEN: Soft, nontender, nondistended. Bowel sounds present. No organomegaly or mass.  EXTREMITIES: No pedal edema, cyanosis, or clubbing.  NEUROLOGIC: Cranial  nerves II through XII are intact. Muscle strength 5/5 in all extremities. Sensation intact. Gait not checked.  PSYCHIATRIC: The patient is alert and oriented x 3.,  Very hard of hearing. SKIN: No obvious rash, lesion, or ulcer.    LABORATORY PANEL:   CBC Recent Labs  Lab 03/05/19 0451  WBC 6.6  HGB 11.8*  HCT 33.8*  PLT 121*   ------------------------------------------------------------------------------------------------------------------  Chemistries  Recent Labs  Lab 03/04/19 0413 03/04/19 0422 03/05/19 0451  NA 130*  --  131*  K 3.1*  --  3.4*  CL 97*  --  102  CO2 24  --  21*  GLUCOSE 114*  --  96  BUN 26*  --  17  CREATININE 1.22  --  0.95  CALCIUM 8.4*  --  7.9*  MG  --  1.7  --   AST 21  --   --   ALT 20  --   --   ALKPHOS 44  --   --   BILITOT 1.0  --   --    ------------------------------------------------------------------------------------------------------------------  Cardiac Enzymes Recent Labs  Lab 03/04/19 0422  TROPONINI <0.03   ------------------------------------------------------------------------------------------------------------------  RADIOLOGY:  Ct Angio Chest Pe W Or Wo Contrast  Result Date: 03/04/2019 CLINICAL DATA:  Shortness of breath. EXAM: CT ANGIOGRAPHY CHEST WITH CONTRAST TECHNIQUE: Multidetector CT imaging of the chest was performed using the standard protocol during bolus administration of intravenous contrast. Multiplanar CT image reconstructions and MIPs were obtained to evaluate the vascular anatomy. CONTRAST:  31mL OMNIPAQUE IOHEXOL 350 MG/ML SOLN COMPARISON:  Radiograph of same day.  CT scan of June 10, 2018. FINDINGS: Cardiovascular: Satisfactory opacification of the pulmonary arteries  to the segmental level. No evidence of pulmonary embolism. Normal heart size. No pericardial effusion. Atherosclerosis of thoracic aorta is noted without aneurysm formation. Mediastinum/Nodes: Esophagus is unremarkable. No adenopathy is  noted. Stable right thyroid nodule is noted. Lungs/Pleura: Lungs are clear. No pleural effusion or pneumothorax. Upper Abdomen: No acute abnormality. Musculoskeletal: No chest wall abnormality. No acute or significant osseous findings. Review of the MIP images confirms the above findings. IMPRESSION: No definite evidence of pulmonary embolus. Stable right thyroid nodule. No acute abnormality seen in the chest. Aortic Atherosclerosis (ICD10-I70.0). Electronically Signed   By: Marijo Conception M.D.   On: 03/04/2019 07:45   Dg Chest Port 1 View  Result Date: 03/04/2019 CLINICAL DATA:  85 year old male with sepsis.  Shortness of breath. EXAM: PORTABLE CHEST 1 VIEW COMPARISON:  Chest CT 06/10/2018. FINDINGS: Portable AP upright view at 0415 hours. Cardiac and mediastinal contours are within normal limits. Visualized tracheal air column is within normal limits. No pneumothorax, pulmonary edema, pleural effusion or consolidation. Mildly increased pulmonary interstitial markings are probably chronic. No confluent opacity identified. Chronic appearing left 7th rib fracture. No acute osseous abnormality identified. IMPRESSION: No definite acute cardiopulmonary abnormality. Mild increased pulmonary interstitial opacity which is probably chronic. Electronically Signed   By: Genevie Ann M.D.   On: 03/04/2019 05:34    EKG:   Orders placed or performed during the hospital encounter of 03/04/19  . EKG 12-Lead  . EKG 12-Lead  . EKG 12-Lead  . EKG 12-Lead  . EKG 12-Lead  . EKG 12-Lead    ASSESSMENT AND PLAN:   85 year old male patient multiple medical problems of pretension, hyperlipidemia, asthma comes in because of fever, cough admitted for clinical pneumonia.  #1. sepsis due to E. coli present on admission secondary to pneumonia, patient has no further fever, clinically improved, continue Rocephin, follow full sensitivity results.,  For clinical pneumonia he is already on Rocephin, Zithromax, follow sputum  cultures. 2.  Hypokalemia, improving, replace. 3.  She of right renal cell cancer status post robotic right nephrectomy in January 2020 by Dr. Hollice Espy, Essential hypertension controlled 5.  Asthma: Stable, no wheezing today. 6.  Deconditioning, physical therapy consult.  #7 acute kidney injury, improved, decrease IV fluids. More than 50% time spent in counseling, coordination of care. All the records are reviewed and case discussed with Care Management/Social Workerr. Management plans discussed with the patient, family and they are in agreement.  CODE STATUS: Full code TOTAL TIME TAKING CARE OF THIS PATIENT: 28minutes.   POSSIBLE D/C IN 1-2 DAYS, DEPENDING ON CLINICAL CONDITION.   Epifanio Lesches M.D on 03/05/2019 at 10:38 AM  Between 7am to 6pm - Pager - [phone]  After 6pm go to [email or web address] - password [email or web address] [facility]  Office  [phone]  CC: Primary care physician; Baxter Hire, MD   Note: This dictation was prepared with Dragon dictation along with smaller phrase technology. Any transcriptional errors that result from this process are unintentional.

## 2019-03-05 NOTE — Progress Notes (Signed)
Spoke topatient's wife over the phone.

## 2019-03-06 LAB — CULTURE, BLOOD (ROUTINE X 2)

## 2019-03-06 LAB — HIV ANTIBODY (ROUTINE TESTING W REFLEX): HIV Screen 4th Generation wRfx: NONREACTIVE

## 2019-03-06 LAB — POTASSIUM: Potassium: 3.7 mmol/L (ref 3.5–5.1)

## 2019-03-06 MED ORDER — HYDROCHLOROTHIAZIDE 25 MG PO TABS
25.0000 mg | ORAL_TABLET | Freq: Every day | ORAL | Status: DC
  Administered 2019-03-06 – 2019-03-07 (×2): 25 mg via ORAL
  Filled 2019-03-06 (×2): qty 1

## 2019-03-06 MED ORDER — CEFAZOLIN SODIUM-DEXTROSE 2-4 GM/100ML-% IV SOLN
2.0000 g | Freq: Three times a day (TID) | INTRAVENOUS | Status: DC
  Administered 2019-03-06 – 2019-03-07 (×4): 2 g via INTRAVENOUS
  Filled 2019-03-06 (×8): qty 100

## 2019-03-06 MED ORDER — CALCIUM CARBONATE ANTACID 500 MG PO CHEW
200.0000 mg | CHEWABLE_TABLET | Freq: Four times a day (QID) | ORAL | Status: DC | PRN
  Administered 2019-03-06 – 2019-03-07 (×2): 200 mg via ORAL
  Filled 2019-03-06 (×2): qty 1

## 2019-03-06 MED ORDER — IPRATROPIUM-ALBUTEROL 0.5-2.5 (3) MG/3ML IN SOLN: 3.0000 mL | RESPIRATORY_TRACT | Status: DC | PRN

## 2019-03-06 NOTE — Care Management Important Message (Signed)
Important Message  Patient Details  Name: Kevin [name] MRN: [id number] Date of Birth: 05/18/1933   Medicare Important Message Given:  Yes    Juliann Pulse A [place] 03/06/2019, 10:49 AM

## 2019-03-06 NOTE — Progress Notes (Signed)
Pts BP 163/92 MD Konidena notified, scheduled BP meds given. No orders received.

## 2019-03-06 NOTE — Plan of Care (Signed)

## 2019-03-06 NOTE — Progress Notes (Signed)
Order received to d/c tele by MD Vianne Bulls. Tele d/c.

## 2019-03-07 MED ORDER — CEFUROXIME AXETIL 500 MG PO TABS: 500.0000 mg | ORAL_TABLET | Freq: Two times a day (BID) | ORAL | 0 refills | Status: AC

## 2019-03-07 NOTE — Plan of Care (Signed)
Pt is d/ced home.  CT was negative for PNA.  Blood cultures + for ecoli.  He will d/c on oral abx.  Will review medication and f/u appts.  Pt and wife told that if he experiences abd pain or N&V to call primary physician.  Will remove IVs after he gets the IV abx.  Will review d/c paperwork and will call wife for pick up.

## 2019-03-07 NOTE — Discharge Summary (Signed)
[place] [facility], 85 y.o., DOB 1933/04/24, MRN [id number]. Admission date: 03/04/2019 Discharge Date 03/07/2019 Primary MD Baxter Hire, MD Admitting Physician Christel Mormon, MD  Admission Diagnosis   Fever   Discharge Diagnosis    E. coli sepsis Pneumonia ruled out Hypokalemia Right renal cancer status post recent surgery [employer]  is a 85 y.o. Caucasian male with a known history of multiple medical problems that will be mentioned below, who presented to the emergency room with a concern of fever at home with associated chills that started last night as well as generalized fatigue and weakness.  He stated he could not walk.  He was noted to be hypoxic by EMS.  He was breathing faster than usual.  Pulse oximetry was down to the mid 80s and on 2 L it came up to the upper 90s.  Initially patient had a chest x-ray was suggestive of pneumonia.  Subsequent CT failed to show any pneumonia.  His evaluation showed positive blood cultures for E. coli.  He was treated with antibiotics.  Source was unclear for the E. coli sepsis.  His urinalysis was negative.  Patient does not have any GI symptoms.  At this point we will treat him for positive blood cultures.  I have advised him and his wife that if he develops any abdominal pain nausea vomiting to be seen by primary care provider.  Will need to be evaluated for gallbladder disease etc. currently he is asymptomatic doing well and stable for discharge.            Consults  None  Significant Tests:  See full reports for all details     Ct Angio Chest Pe W Or Wo Contrast  Result Date: 03/04/2019 CLINICAL DATA:  Shortness of breath. EXAM: CT ANGIOGRAPHY CHEST WITH CONTRAST TECHNIQUE: Multidetector CT imaging of the chest was performed using the standard protocol during bolus administration of intravenous contrast. Multiplanar CT image reconstructions and MIPs  were obtained to evaluate the vascular anatomy. CONTRAST:  67mL OMNIPAQUE IOHEXOL 350 MG/ML SOLN COMPARISON:  Radiograph of same day.  CT scan of June 10, 2018. FINDINGS: Cardiovascular: Satisfactory opacification of the pulmonary arteries to the segmental level. No evidence of pulmonary embolism. Normal heart size. No pericardial effusion. Atherosclerosis of thoracic aorta is noted without aneurysm formation. Mediastinum/Nodes: Esophagus is unremarkable. No adenopathy is noted. Stable right thyroid nodule is noted. Lungs/Pleura: Lungs are clear. No pleural effusion or pneumothorax. Upper Abdomen: No acute abnormality. Musculoskeletal: No chest wall abnormality. No acute or significant osseous findings. Review of the MIP images confirms the above findings. IMPRESSION: No definite evidence of pulmonary embolus. Stable right thyroid nodule. No acute abnormality seen in the chest. Aortic Atherosclerosis (ICD10-I70.0). Electronically Signed   By: Marijo Conception M.D.   On: 03/04/2019 07:45   Dg Chest Port 1 View  Result Date: 03/04/2019 CLINICAL DATA:  85 year old male with sepsis.  Shortness of breath. EXAM: PORTABLE CHEST 1 VIEW COMPARISON:  Chest CT 06/10/2018. FINDINGS: Portable AP upright view at 0415 hours. Cardiac and mediastinal contours are within normal limits. Visualized tracheal air column is within normal limits. No pneumothorax, pulmonary edema, pleural effusion or consolidation. Mildly increased pulmonary interstitial markings are probably chronic. No confluent opacity identified. Chronic appearing left 7th rib fracture. No acute osseous abnormality identified. IMPRESSION: No definite acute cardiopulmonary abnormality. Mild increased pulmonary interstitial opacity which is probably  chronic. Electronically Signed   By: Genevie Ann M.D.   On: 03/04/2019 05:34       Today   Subjective:   Annice Needy patient denies any complaints Objective:   Blood pressure 118/83, pulse 93, temperature 98.4 F  (36.9 C), temperature source Oral, resp. rate 18, height 6\' 1"  (1.854 m), weight 81.6 kg, SpO2 100 %.  .  Intake/Output Summary (Last 24 hours) at 03/07/2019 1224 Last data filed at 03/07/2019 0954 Gross per 24 hour  Intake 940 ml  Output 700 ml  Net 240 ml    Exam VITAL SIGNS: Blood pressure 118/83, pulse 93, temperature 98.4 F (36.9 C), temperature source Oral, resp. rate 18, height 6\' 1"  (1.854 m), weight 81.6 kg, SpO2 100 %.  GENERAL:  85 y.o.-year-old patient lying in the bed with no acute distress.  EYES: Pupils equal, round, reactive to light and accommodation. No scleral icterus. Extraocular muscles intact.  HEENT: Head atraumatic, normocephalic. Oropharynx and nasopharynx clear.  NECK:  Supple, no jugular venous distention. No thyroid enlargement, no tenderness.  LUNGS: Normal breath sounds bilaterally, no wheezing, rales,rhonchi or crepitation. No use of accessory muscles of respiration.  CARDIOVASCULAR: S1, S2 normal. No murmurs, rubs, or gallops.  ABDOMEN: Soft, nontender, nondistended. Bowel sounds present. No organomegaly or mass.  EXTREMITIES: No pedal edema, cyanosis, or clubbing.  NEUROLOGIC: Cranial nerves II through XII are intact. Muscle strength 5/5 in all extremities. Sensation intact. Gait not checked.  PSYCHIATRIC: The patient is alert and oriented x 3.  SKIN: No obvious rash, lesion, or ulcer.   Data Review     CBC w Diff:  Lab Results  Component Value Date   WBC 6.6 03/05/2019   HGB 11.8 (L) 03/05/2019   HCT 33.8 (L) 03/05/2019   PLT 121 (L) 03/05/2019   LYMPHOPCT 4 03/04/2019   MONOPCT 8 03/04/2019   EOSPCT 0 03/04/2019   BASOPCT 0 03/04/2019   CMP:  Lab Results  Component Value Date   NA 131 (L) 03/05/2019   NA 134 12/17/2018   K 3.7 03/06/2019   CL 102 03/05/2019   CO2 21 (L) 03/05/2019   BUN 17 03/05/2019   BUN 19 12/17/2018   CREATININE 0.95 03/05/2019   PROT 6.4 (L) 03/04/2019   ALBUMIN 3.6 03/04/2019   BILITOT 1.0 03/04/2019    ALKPHOS 44 03/04/2019   AST 21 03/04/2019   ALT 20 03/04/2019  .  Micro Results Recent Results (from the past 240 hour(s))  Blood Culture (routine x 2)     Status: Abnormal   Collection Time: 03/04/19  4:22 AM  Result Value Ref Range Status   Specimen Description   Final    BLOOD LEFT ANTECUBITAL Performed at [facility], [address], [place], [place] [address]    Special Requests   Final    BOTTLES DRAWN AEROBIC AND ANAEROBIC Blood Culture results may not be optimal due to an excessive volume of blood received in culture bottles Performed at [facility], [address] [place], [place] [address]    Culture  Setup Time   Final    IN BOTH AEROBIC AND ANAEROBIC BOTTLES GRAM NEGATIVE RODS CRITICAL VALUE NOTED.  VALUE IS CONSISTENT WITH PREVIOUSLY REPORTED AND CALLED VALUE. CRITICAL RESULT CALLED TO, READ BACK BY AND VERIFIED WITH: CALLED TO [facility] DUNCAN @1517  03/04/2019 [facility] Performed at [facility], [address] [place], [place] [address]    Culture (A)  Final    ESCHERICHIA COLI SUSCEPTIBILITIES PERFORMED  ON PREVIOUS CULTURE WITHIN THE LAST 5 DAYS. Performed at [facility], [place] [address] [place], [place] [address]    Report Status 03/06/2019 FINAL  Final  Blood Culture (routine x 2)     Status: Abnormal   Collection Time: 03/04/19  4:22 AM  Result Value Ref Range Status   Specimen Description   Final    BLOOD BLOOD RIGHT HAND Performed at [facility], [address] [place], [place] [address]    Special Requests   Final    BOTTLES DRAWN AEROBIC AND ANAEROBIC Blood Culture results may not be optimal due to an excessive volume of blood received in culture bottles Performed at [facility], [address] [place], [place] [address]    Culture  Setup Time   Final    IN BOTH AEROBIC AND ANAEROBIC BOTTLES GRAM NEGATIVE RODS CRITICAL RESULT CALLED TO, READ BACK BY AND VERIFIED WITH: CALLED TO [place]  @1517  03/04/2019 [place] Performed at [facility], [place] [address], [place], [place] [address]    Culture ESCHERICHIA COLI (A)  Final   Report Status 03/06/2019 FINAL  Final   Organism ID, Bacteria ESCHERICHIA COLI  Final      Susceptibility   Escherichia coli - MIC*    AMPICILLIN <=2 SENSITIVE Sensitive     CEFAZOLIN <=4 SENSITIVE Sensitive     CEFEPIME <=1 SENSITIVE Sensitive     CEFTAZIDIME <=1 SENSITIVE Sensitive     CEFTRIAXONE <=1 SENSITIVE Sensitive     CIPROFLOXACIN <=0.25 SENSITIVE Sensitive     GENTAMICIN <=1 SENSITIVE Sensitive     IMIPENEM <=0.25 SENSITIVE Sensitive     TRIMETH/SULFA <=20 SENSITIVE Sensitive     AMPICILLIN/SULBACTAM <=2 SENSITIVE Sensitive     PIP/TAZO <=4 SENSITIVE Sensitive     Extended ESBL NEGATIVE Sensitive     * ESCHERICHIA COLI  Urine culture     Status: None   Collection Time: 03/04/19  4:22 AM  Result Value Ref Range Status   Specimen Description   Final    URINE, RANDOM Performed at [facility], [address], [place], [place] [address]    Special Requests   Final    NONE Performed at [facility], [address], [place], [place] [address]    Culture   Final    NO GROWTH Performed at [facility], [place] [address], [place], [place] [address]    Report Status 03/05/2019 FINAL  Final  SARS Coronavirus 2 [facility] order, Performed in Center hospital lab)     Status: None   Collection Time: 03/04/19  4:22 AM  Result Value Ref Range Status   SARS Coronavirus 2 NEGATIVE NEGATIVE Final    Comment: (NOTE) If result is NEGATIVE SARS-CoV-2 target nucleic acids are NOT DETECTED. The SARS-CoV-2 RNA is generally detectable in upper and lower  respiratory specimens during the acute phase of infection. The lowest  concentration of SARS-CoV-2 viral copies this assay can detect is 250  copies / mL. A negative result does not preclude SARS-CoV-2 infection  and should not be used as the sole basis for treatment or  other  patient management decisions.  A negative result may occur with  improper specimen collection / handling, submission of specimen other  than nasopharyngeal swab, presence of viral mutation(s) within the  areas targeted by this assay, and inadequate number of viral copies  (<250 copies / mL). A negative result must be combined with clinical  observations,  patient history, and epidemiological information. If result is POSITIVE SARS-CoV-2 target nucleic acids are DETECTED. The SARS-CoV-2 RNA is generally detectable in upper and lower  respiratory specimens dur ing the acute phase of infection.  Positive  results are indicative of active infection with SARS-CoV-2.  Clinical  correlation with patient history and other diagnostic information is  necessary to determine patient infection status.  Positive results do  not rule out bacterial infection or co-infection with other viruses. If result is PRESUMPTIVE POSTIVE SARS-CoV-2 nucleic acids MAY BE PRESENT.   A presumptive positive result was obtained on the submitted specimen  and confirmed on repeat testing.  While 2019 novel coronavirus  (SARS-CoV-2) nucleic acids may be present in the submitted sample  additional confirmatory testing may be necessary for epidemiological  and / or clinical management purposes  to differentiate between  SARS-CoV-2 and other Sarbecovirus currently known to infect humans.  If clinically indicated additional testing with an alternate test  methodology [phone]) is advised. The SARS-CoV-2 RNA is generally  detectable in upper and lower respiratory sp ecimens during the acute  phase of infection. The expected result is Negative. Fact Sheet for Patients:  StrictlyIdeas.no Fact Sheet for Healthcare Providers: BankingDealers.co.za This test is not yet approved or cleared by the [place] FDA and has been authorized for detection and/or diagnosis of  SARS-CoV-2 by FDA under an Emergency Use Authorization (EUA).  This EUA will remain in effect (meaning this test can be used) for the duration of the COVID-19 declaration under Section 564(b)(1) of the Act, 21 U.S.C. section 360bbb-3(b)(1), unless the authorization is terminated or revoked sooner. Performed at [facility], [address] [place], [place] [address]   Blood Culture ID Panel (Reflexed)     Status: Abnormal   Collection Time: 03/04/19  4:22 AM  Result Value Ref Range Status   Enterococcus species NOT DETECTED NOT DETECTED Final   Listeria monocytogenes NOT DETECTED NOT DETECTED Final   Staphylococcus species NOT DETECTED NOT DETECTED Final   Staphylococcus aureus (BCID) NOT DETECTED NOT DETECTED Final   Streptococcus species NOT DETECTED NOT DETECTED Final   Streptococcus agalactiae NOT DETECTED NOT DETECTED Final   Streptococcus pneumoniae NOT DETECTED NOT DETECTED Final   Streptococcus pyogenes NOT DETECTED NOT DETECTED Final   Acinetobacter baumannii NOT DETECTED NOT DETECTED Final   Enterobacteriaceae species DETECTED (A) NOT DETECTED Final    Comment: Enterobacteriaceae represent a large family of gram-negative bacteria, not a single organism. CRITICAL RESULT CALLED TO, READ BACK BY AND VERIFIED WITH: CALLED TO [place] @1517  03/04/2019 [place]    Enterobacter cloacae complex NOT DETECTED NOT DETECTED Final   Escherichia coli DETECTED (A) NOT DETECTED Final    Comment: CRITICAL RESULT CALLED TO, READ BACK BY AND VERIFIED WITH: CALLED TO ASAJAH DUNCAN @1517  03/04/2019 [place]    Klebsiella oxytoca NOT DETECTED NOT DETECTED Final   Klebsiella pneumoniae NOT DETECTED NOT DETECTED Final   Proteus species NOT DETECTED NOT DETECTED Final   Serratia marcescens NOT DETECTED NOT DETECTED Final   Carbapenem resistance NOT DETECTED NOT DETECTED Final   Haemophilus influenzae NOT DETECTED NOT DETECTED Final   Neisseria meningitidis NOT DETECTED NOT DETECTED Final    Pseudomonas aeruginosa NOT DETECTED NOT DETECTED Final   Candida albicans NOT DETECTED NOT DETECTED Final   Candida glabrata NOT DETECTED NOT DETECTED Final   Candida krusei NOT DETECTED NOT DETECTED Final   Candida parapsilosis NOT DETECTED NOT DETECTED Final   Candida tropicalis NOT DETECTED NOT DETECTED  Final    Comment: Performed at [facility], [address] [place], [place] [address]  Culture, sputum-assessment     Status: None   Collection Time: 03/05/19  5:13 AM  Result Value Ref Range Status   Specimen Description SPUTUM  Final   Special Requests NONE  Final   Sputum evaluation   Final    Sputum specimen not acceptable for testing.  Please recollect.   Cheryln Manly RN [id number] 03/05/2019 [facility] Performed at [facility], [address] [place], [place] [address]    Report Status 03/05/2019 FINAL  Final  CULTURE, BLOOD (ROUTINE X 2) w Reflex to ID Panel     Status: None (Preliminary result)   Collection Time: 03/05/19 10:52 AM  Result Value Ref Range Status   Specimen Description BLOOD LEFT AC  Final   Special Requests   Final    BOTTLES DRAWN AEROBIC AND ANAEROBIC Blood Culture results may not be optimal due to an excessive volume of blood received in culture bottles   Culture   Final    NO GROWTH 2 DAYS Performed at [facility], [address] [place], [place] [address]    Report Status PENDING  Incomplete  CULTURE, BLOOD (ROUTINE X 2) w Reflex to ID Panel     Status: None (Preliminary result)   Collection Time: 03/05/19 11:03 AM  Result Value Ref Range Status   Specimen Description BLOOD RIGHT WRIST  Final   Special Requests   Final    BOTTLES DRAWN AEROBIC AND ANAEROBIC Blood Culture results may not be optimal due to an excessive volume of blood received in culture bottles   Culture   Final    NO GROWTH 2 DAYS Performed at [facility], [address] [place], [place] [address]    Report Status PENDING  Incomplete         Code Status Orders  (From admission, onward)         Start     Ordered   03/04/19 0630  Full code  Continuous     03/04/19 0638        Code Status History    Date Active Date Inactive Code Status Order ID Comments User Context   11/17/2018 1248 11/18/2018 2005 Full Code [id number]  Hollice Espy, MD Inpatient    Advance Directive Documentation     Most Recent Value  Type of Advance Directive  Healthcare Power of Attorney, Living will  Pre-existing out of facility DNR order (yellow form or pink MOST form)  -  "MOST" Form in Place?  -          Follow-up Information    Baxter Hire, MD Follow up in 6 day(s).   Specialty:  Internal Medicine Why:  hosp f/u Contact information: [place] [address] [phone]           Discharge Medications   Allergies as of 03/07/2019   No Known Allergies     Medication List    TAKE these medications   amLODipine 5 MG tablet Commonly known as:  NORVASC Take 5 mg by mouth daily.   cefUROXime 500 MG tablet Commonly known as:  CEFTIN Take 1 tablet (500 mg total) by mouth 2 (two) times daily for 10 days.   hydrochlorothiazide 25 MG tablet Commonly known as:  HYDRODIURIL Take 25 mg by mouth daily.   Proventil HFA 108 (90 Base) MCG/ACT inhaler Generic drug:  albuterol Inhale 2 puffs  into the lungs every 6 (six) hours as needed for wheezing or shortness of breath.   simvastatin 10 MG tablet Commonly known as:  ZOCOR Take 10 mg by mouth daily.          Total Time in preparing paper work, data evaluation and todays exam - 57 minutes  Dustin Flock M.D on 03/07/2019 at 12:24 PM [place]  [phone]

## 2019-03-09 ENCOUNTER — Telehealth: Payer: Self-pay | Admitting: Urology

## 2019-03-09 NOTE — Telephone Encounter (Signed)
Patient's wife called and would like for you to review his ED notes and make sure there was nothing wrong with his kidney? She said he was in the hospital and she is worried about it?   Sharyn Lull

## 2019-03-09 NOTE — Telephone Encounter (Signed)
Mychart sent.

## 2019-03-09 NOTE — Telephone Encounter (Signed)
I reviewed his records.  It does not appear that he was admitted for any GU issues.  His kidney function was normal.  They did not image the kidneys.  I recommend following up as scheduled.  Hollice Espy, MD

## 2019-03-10 LAB — CULTURE, BLOOD (ROUTINE X 2)
Culture: NO GROWTH
Culture: NO GROWTH

## 2019-06-16 ENCOUNTER — Other Ambulatory Visit: Payer: Self-pay

## 2019-06-16 ENCOUNTER — Ambulatory Visit
Admission: RE | Admit: 2019-06-16 | Discharge: 2019-06-16 | Disposition: A | Discharge status: Home / Self Care | Payer: Medicare HMO | Source: Ambulatory Visit | Attending: Urology | Admitting: Urology

## 2019-06-16 DIAGNOSIS — N2889 Other specified disorders of kidney and ureter: Secondary | ICD-10-CM

## 2019-06-16 LAB — POCT I-STAT CREATININE: Creatinine, Ser: 1.1 mg/dL (ref 0.61–1.24)

## 2019-06-16 MED ORDER — IOHEXOL 300 MG/ML  SOLN: 100.0000 mL | Freq: Once | INTRAMUSCULAR | Status: DC | PRN

## 2019-06-16 MED ORDER — IOHEXOL 300 MG/ML  SOLN
80.0000 mL | Freq: Once | INTRAMUSCULAR | Status: AC | PRN
  Administered 2019-06-16: 09:00:00 80 mL via INTRAVENOUS

## 2019-06-17 ENCOUNTER — Ambulatory Visit: Payer: Medicare HMO | Admitting: Urology

## 2019-06-17 ENCOUNTER — Encounter: Payer: Self-pay | Admitting: Urology

## 2019-06-17 VITALS — BP 112/69 | HR 83 | Ht 73.0 in | Wt 165.0 lb

## 2019-06-17 DIAGNOSIS — C641 Malignant neoplasm of right kidney, except renal pelvis: Secondary | ICD-10-CM | POA: Diagnosis not present

## 2019-06-17 DIAGNOSIS — K769 Liver disease, unspecified: Secondary | ICD-10-CM

## 2019-06-17 NOTE — Progress Notes (Signed)
06/17/2019 12:09 PM   Kevin [name] 04-Jan-1933 [id number]  Referring provider: Baxter Hire, MD [place],  [place] [address]  Chief Complaint  Patient presents with  . Follow-up    CT scan    HPI: 85 year old male who returns today for routine six-month follow-up following right laparoscopic robot-assisted radical nephrectomy for follow up imaging.    On 11/17/2018 heunderwent robotic assisted laparoscopic nephrectomy.   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 PT1b, Nx  He returns today with follow-up CT abdomen pelvis with contrast.  It shows no evidence of local recurrence or metastatic disease.  He does have an incidental finding in his liver, described as a 1.4 x 1 cm hypervascular lesion noted on the arterial phase likely representing a flash fill hemangioma.  Notably, the patient reports that he was evaluated for this about 20 years ago at [facility].  He even underwent liver biopsy which showed what he said was a "blood vessel issue".  He is not had subsequent follow-up of this.  CT of the chest in the form of CT angios to rule out PE on 03/04/2019.  This showed no suspicious lung lesions.  Overall, he is doing well.  He is trying to avoid being out.  He is wearing a mask.  He has no complaints today.  Creatinine is 1.1 as of 06/16/2019.   PMH: Past Medical History:  Diagnosis Date  . Asthma   . Broken arm    Pt was 19  . 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 Cataract cortical, senile   . Colon polyps   . GERD (gastroesophageal reflux disease)   . Hyperlipidemia   . Hypertension   . Pneumonia    AS A CHILD    Surgical History: Past Surgical History:  Procedure Laterality Date  . COLONOSCOPY  2001, 2006, 2012  . ESOPHAGOGASTRODUODENOSCOPY  09/11/2000  . FLEXIBLE SIGMOIDOSCOPY  03/02/1997  . NASAL POLYP EXCISION    . ROBOT ASSISTED LAPAROSCOPIC  NEPHRECTOMY Right 11/17/2018   Procedure: ROBOTIC ASSISTED LAPAROSCOPIC NEPHRECTOMY;  Surgeon: Hollice Espy, MD;  Location: [facility] ORS;  Service: Urology;  Laterality: Right;    Home Medications:  Allergies as of 06/17/2019   No Known Allergies     Medication List       Accurate as of June 17, 2019 11:59 PM. If you have any questions, ask your nurse or doctor.        amLODipine 5 MG tablet Commonly known as: NORVASC Take 5 mg by mouth daily.   carbidopa-levodopa 25-100 MG tablet Commonly known as: SINEMET IR Take by mouth.   hydrochlorothiazide 25 MG tablet Commonly known as: HYDRODIURIL Take 25 mg by mouth daily.   Proventil HFA 108 (90 Base) MCG/ACT inhaler Generic drug: albuterol Inhale 2 puffs into the lungs every 6 (six) hours as needed for wheezing or shortness of breath.   simvastatin 10 MG tablet Commonly known as: ZOCOR Take 10 mg by mouth daily.       Allergies: No Known Allergies  Family History: Family History  Problem Relation Age of Onset  . Heart disease Mother   . Pancreatitis Mother   . Emphysema Father   . Breast cancer Sister   . Breast cancer Sister     Social History:  reports that he has never smoked. He has never used smokeless tobacco. He reports that he does not drink  alcohol or use drugs.  ROS: UROLOGY Frequent Urination?: No Hard to postpone urination?: No Burning/pain with urination?: No Get up at night to urinate?: Yes Leakage of urine?: Yes Urine stream starts and stops?: No Trouble starting stream?: No Do you have to strain to urinate?: No Blood in urine?: No Urinary tract infection?: No Sexually transmitted disease?: No Injury to kidneys or bladder?: No Painful intercourse?: No Weak stream?: No Erection problems?: No Penile pain?: No  Gastrointestinal Nausea?: No Vomiting?: No Indigestion/heartburn?: No Diarrhea?: No Constipation?: No  Constitutional Fever: No Night sweats?: No Weight loss?: No  Fatigue?: No  Skin Skin rash/lesions?: No Itching?: No  Eyes Blurred vision?: No Double vision?: No  Ears/Nose/Throat Sore throat?: No Sinus problems?: No  Hematologic/Lymphatic Swollen glands?: No Easy bruising?: No  Cardiovascular Leg swelling?: No Chest pain?: No  Respiratory Cough?: No Shortness of breath?: No  Endocrine Excessive thirst?: No  Musculoskeletal Back pain?: No Joint pain?: No  Neurological Headaches?: No Dizziness?: No  Psychologic Depression?: No Anxiety?: No  Physical Exam: BP 112/69   Pulse 83   Ht 6\' 1"  (1.854 m)   Wt 165 lb (74.8 kg)   BMI 21.77 kg/m   Constitutional:  Alert and oriented, No acute distress. HEENT: [place] AT, moist mucus membranes.  Trachea midline, no masses.  Wearing hearing aids, mildly hearing impaired.   Cardiovascular: No clubbing, cyanosis, or edema. Respiratory: Normal respiratory effort, no increased work of breathing. Skin: No rashes, bruises or suspicious lesions. Neurologic: Grossly intact, no focal deficits, moving all 4 extremities. Psychiatric: Normal mood and affect.  Laboratory Data: Lab Results  Component Value Date   WBC 6.6 03/05/2019   HGB 11.8 (L) 03/05/2019   HCT 33.8 (L) 03/05/2019   MCV 87.1 03/05/2019   PLT 121 (L) 03/05/2019    Lab Results  Component Value Date   CREATININE 1.10 06/16/2019    Pertinent Imaging: CLINICAL DATA:  85 year old male with history of renal mass. Follow-up study.  EXAM: CT ABDOMEN WITHOUT AND WITH CONTRAST  TECHNIQUE: Multidetector CT imaging of the abdomen was performed following the standard protocol before and following the bolus administration of intravenous contrast.  CONTRAST:  16mL OMNIPAQUE IOHEXOL 300 MG/ML  SOLN  COMPARISON:  CT the abdomen 10/30/2018.  FINDINGS: Lower chest: Aortic atherosclerosis.  Hepatobiliary: 2.2 x 1.7 cm low-attenuation lesion in the periphery of segment 6 (axial image 55 of series 3), stable compared  to prior examinations, compatible with a simple cyst. In segment 2 of the liver (axial image 18 of series 3) there is also a small 1.4 x 1.0 cm hypervascular lesion noted on arterial phase which is not well demonstrated on other phases of the study, likely to represent a flash fill cavernous hemangioma. No other suspicious cystic or solid hepatic lesions. No intra or extrahepatic biliary ductal dilatation. Gallbladder is normal in appearance.  Pancreas: No pancreatic mass. No pancreatic ductal dilatation. No pancreatic or peripancreatic fluid or inflammatory changes.  Spleen: Unremarkable.  Adrenals/Urinary Tract: Status post right nephrectomy. No unexpected soft tissue mass in the nephrectomy bed to suggest locally recurrent disease. Exophytic 1.7 x 1.4 cm high attenuation lesion in the lower pole of the left kidney laterally which does not enhance, compatible with a small proteinaceous/hemorrhagic cyst. In addition, in the posterior aspect of the interpolar region of the left kidney measures 3.1 cm in diameter, and is low-attenuation without enhancement, compatible with a simple cyst. No suspicious left renal lesions. Bilateral adrenal glands are normal in appearance. No hydroureteronephrosis  in the visualized portions of the abdomen.  Stomach/Bowel: Normal appearance of the stomach. Large duodenal diverticulum extending off the medial aspect of the second portion of the duodenum measuring 5.4 x 4.4 x 3.9 cm. No surrounding inflammatory changes to suggest an associated duodenal diverticulitis. No pathologic dilatation of visualized portions of small bowel or colon. A few scattered colonic diverticulae are noted, without surrounding inflammatory changes in the visualized portions of the abdomen. Appendix is partially visualized and appears normal.  Vascular/Lymphatic: Aortic atherosclerosis, without evidence of aneurysm or dissection in the abdominal vasculature. Ectasia of  the celiac axis which measures up to 1.2 cm in diameter. No lymphadenopathy noted in the abdomen.  Other: No significant volume of ascites and no pneumoperitoneum in the visualized portions of the peritoneal cavity.  Musculoskeletal: There are no aggressive appearing lytic or blastic lesions noted in the visualized portions of the skeleton.  IMPRESSION: 1. Status post right radical nephrectomy. No findings to suggest locally recurrent disease or definite metastatic disease in the abdomen. 2. However, there is a hypervascular lesion in segment 2 of the liver. This is favored to represent a benign perfusion anomaly or flash fill cavernous hemangioma. Strictly speaking, the possibility of a hypervascular metastasis from renal cell carcinoma is not entirely excluded, however, and follow-up evaluation with nonemergent abdominal MRI with and without IV gadolinium is recommended in the near future to provide definitive characterization. 3. Aortic atherosclerosis. 4. Additional incidental findings, similar to prior studies, as above.   Electronically Signed   By: Vinnie Langton M.D.   On: 06/16/2019 10:55  CT abd/ pelvis imaging was personally reviewed today.  Agree with radiologic interpretation.  Assessment & Plan:    1. Carcinoma, renal cell, right ([place]) No evidence of metastatic or recurrent disease Per NCCN guidelines, will continue annual surveillance imaging for a total of 3 years He is agreeable this plan Return in 1 year for CT abdomen pelvis as well as chest x-ray Continue solitary kidney precautions - CT Abdomen Pelvis W Contrast; Future - Chest 1 View; Future  2. Liver lesion Incidental finding on CT scan likely hemangioma Given his clinical history, I have very low suspicion that this is a malignant or metastatic process I have offered the patient an MR, he like to discuss this with his wife and decide if he will pursue this I placed the order, he will let  us know how he like to proceed - MR LIVER W WO CONTRAST; Future   Return in about 1 year (around 06/16/2020) for CT/ CXR  (will call with MR liver results).  Hollice Espy, MD  [facility] [address] [place], [place] [address] [phone]

## 2019-11-03 ENCOUNTER — Telehealth: Payer: Self-pay | Admitting: Urology

## 2019-11-03 ENCOUNTER — Other Ambulatory Visit: Payer: Self-pay | Admitting: Internal Medicine

## 2019-11-03 DIAGNOSIS — S22080A Wedge compression fracture of T11-T12 vertebra, initial encounter for closed fracture: Secondary | ICD-10-CM

## 2019-11-03 NOTE — Telephone Encounter (Signed)
Gave phone number for Radiology to answer questions per Dr. Erlene Quan.

## 2019-11-03 NOTE — Telephone Encounter (Signed)
Pt's wife called and states that pt is having a MRI on his lumbar next week and wants to know if he can also have his MRI Liver the same day?

## 2019-11-10 ENCOUNTER — Ambulatory Visit
Admission: RE | Admit: 2019-11-10 | Discharge: 2019-11-10 | Disposition: A | Discharge status: Home / Self Care | Payer: Medicare HMO | Source: Ambulatory Visit | Attending: Internal Medicine | Admitting: Internal Medicine

## 2019-11-10 ENCOUNTER — Other Ambulatory Visit: Payer: Self-pay

## 2019-11-10 DIAGNOSIS — S22080A Wedge compression fracture of T11-T12 vertebra, initial encounter for closed fracture: Secondary | ICD-10-CM | POA: Insufficient documentation

## 2019-11-11 ENCOUNTER — Telehealth: Payer: Self-pay | Admitting: Urology

## 2019-11-11 ENCOUNTER — Other Ambulatory Visit: Payer: Self-pay | Admitting: Orthopedic Surgery

## 2019-11-11 ENCOUNTER — Other Ambulatory Visit
Admission: RE | Admit: 2019-11-11 | Discharge: 2019-11-11 | Disposition: A | Discharge status: Home / Self Care | Payer: Medicare HMO | Source: Ambulatory Visit | Attending: Orthopedic Surgery | Admitting: Orthopedic Surgery

## 2019-11-11 DIAGNOSIS — Z01812 Encounter for preprocedural laboratory examination: Secondary | ICD-10-CM | POA: Insufficient documentation

## 2019-11-11 DIAGNOSIS — Z20822 Contact with and (suspected) exposure to covid-19: Secondary | ICD-10-CM | POA: Insufficient documentation

## 2019-11-11 LAB — SARS CORONAVIRUS 2 (TAT 6-24 HRS): SARS Coronavirus 2: NEGATIVE

## 2019-11-11 NOTE — Telephone Encounter (Signed)
Dr Erlene Quan removed pt's kidney January of last year.  He had an MRI and it showed about his other kidney and pt's wife wants Dr Erlene Quan to look at MRI to make sure the other kidney is functioning properly.

## 2019-11-12 NOTE — Telephone Encounter (Signed)
Spoke with patient's wife-advised. Voiced understanding.

## 2019-11-12 NOTE — Telephone Encounter (Signed)
The patient's MRI was of his lumbar spine not his kidney.  I cannot make any real assessment about his solitary kidney with the study.  Hollice Espy, MD

## 2019-11-13 ENCOUNTER — Encounter
Admission: RE | Disposition: A | Discharge status: Home / Self Care | Payer: Self-pay | Source: Home / Self Care | Attending: Orthopedic Surgery

## 2019-11-13 ENCOUNTER — Encounter: Payer: Self-pay | Admitting: Orthopedic Surgery

## 2019-11-13 ENCOUNTER — Other Ambulatory Visit: Payer: Self-pay

## 2019-11-13 ENCOUNTER — Ambulatory Visit: Payer: Medicare HMO

## 2019-11-13 ENCOUNTER — Ambulatory Visit
Admission: RE | Admit: 2019-11-13 | Discharge: 2019-11-13 | Disposition: A | Discharge status: Home / Self Care | Payer: Medicare HMO | Attending: Orthopedic Surgery | Admitting: Orthopedic Surgery

## 2019-11-13 DIAGNOSIS — Z85828 Personal history of other malignant neoplasm of skin: Secondary | ICD-10-CM | POA: Insufficient documentation

## 2019-11-13 DIAGNOSIS — E785 Hyperlipidemia, unspecified: Secondary | ICD-10-CM | POA: Insufficient documentation

## 2019-11-13 DIAGNOSIS — G20 Parkinson's disease: Secondary | ICD-10-CM | POA: Insufficient documentation

## 2019-11-13 DIAGNOSIS — Z419 Encounter for procedure for purposes other than remedying health state, unspecified: Secondary | ICD-10-CM

## 2019-11-13 DIAGNOSIS — S22080A Wedge compression fracture of T11-T12 vertebra, initial encounter for closed fracture: Secondary | ICD-10-CM | POA: Diagnosis present

## 2019-11-13 DIAGNOSIS — X58XXXA Exposure to other specified factors, initial encounter: Secondary | ICD-10-CM | POA: Diagnosis not present

## 2019-11-13 DIAGNOSIS — K219 Gastro-esophageal reflux disease without esophagitis: Secondary | ICD-10-CM | POA: Insufficient documentation

## 2019-11-13 DIAGNOSIS — Z79899 Other long term (current) drug therapy: Secondary | ICD-10-CM | POA: Insufficient documentation

## 2019-11-13 DIAGNOSIS — Z8616 Personal history of COVID-19: Secondary | ICD-10-CM | POA: Diagnosis not present

## 2019-11-13 DIAGNOSIS — I10 Essential (primary) hypertension: Secondary | ICD-10-CM | POA: Diagnosis not present

## 2019-11-13 HISTORY — PX: KYPHOPLASTY: SHX5884

## 2019-11-13 SURGERY — KYPHOPLASTY
Anesthesia: General | Site: Thoracic

## 2019-11-13 MED ORDER — LACTATED RINGERS IV SOLN: INTRAVENOUS | Status: DC

## 2019-11-13 MED ORDER — LIDOCAINE HCL 1 % IJ SOLN
INTRAMUSCULAR | Status: DC | PRN
  Administered 2019-11-13: 30 mL

## 2019-11-13 MED ORDER — OXYCODONE HCL 5 MG PO TABS: 5.0000 mg | ORAL_TABLET | Freq: Once | ORAL | Status: DC | PRN

## 2019-11-13 MED ORDER — OXYCODONE HCL 5 MG/5ML PO SOLN: 5.0000 mg | Freq: Once | ORAL | Status: DC | PRN

## 2019-11-13 MED ORDER — FENTANYL CITRATE (PF) 100 MCG/2ML IJ SOLN
INTRAMUSCULAR | Status: DC | PRN
  Administered 2019-11-13 (×4): 12.5 ug via INTRAVENOUS

## 2019-11-13 MED ORDER — MIDAZOLAM HCL 2 MG/2ML IJ SOLN
INTRAMUSCULAR | Status: AC
  Filled 2019-11-13: qty 2

## 2019-11-13 MED ORDER — BUPIVACAINE-EPINEPHRINE (PF) 0.5% -1:200000 IJ SOLN
INTRAMUSCULAR | Status: DC | PRN
  Administered 2019-11-13: 20 mL

## 2019-11-13 MED ORDER — CEFAZOLIN SODIUM-DEXTROSE 2-4 GM/100ML-% IV SOLN
2.0000 g | INTRAVENOUS | Status: AC
  Administered 2019-11-13: 2 g via INTRAVENOUS

## 2019-11-13 MED ORDER — MIDAZOLAM HCL 2 MG/2ML IJ SOLN
INTRAMUSCULAR | Status: DC | PRN
  Administered 2019-11-13: .5 mg via INTRAVENOUS

## 2019-11-13 MED ORDER — CHLORHEXIDINE GLUCONATE 4 % EX LIQD
60.0000 mL | Freq: Once | CUTANEOUS | Status: AC
  Administered 2019-11-13: 4 via TOPICAL

## 2019-11-13 MED ORDER — PROPOFOL 500 MG/50ML IV EMUL
INTRAVENOUS | Status: DC | PRN
  Administered 2019-11-13: 75 ug/kg/min via INTRAVENOUS

## 2019-11-13 MED ORDER — IOHEXOL 180 MG/ML  SOLN
INTRAMUSCULAR | Status: DC | PRN
  Administered 2019-11-13: 30 mL

## 2019-11-13 MED ORDER — FENTANYL CITRATE (PF) 100 MCG/2ML IJ SOLN: 25.0000 ug | INTRAMUSCULAR | Status: DC | PRN

## 2019-11-13 MED ORDER — CEFAZOLIN SODIUM-DEXTROSE 2-4 GM/100ML-% IV SOLN
INTRAVENOUS | Status: AC
  Filled 2019-11-13: qty 100

## 2019-11-13 MED ORDER — FENTANYL CITRATE (PF) 100 MCG/2ML IJ SOLN
INTRAMUSCULAR | Status: AC
  Filled 2019-11-13: qty 2

## 2019-11-13 SURGICAL SUPPLY — 20 items

## 2019-11-13 NOTE — Anesthesia Postprocedure Evaluation (Signed)
Anesthesia Post Note  Patient: Kevin [name]  Procedure(s) Performed: T12 KYPHOPLASTY (N/A Thoracic)  Patient location during evaluation: PACU Anesthesia Type: General Level of consciousness: awake and alert Pain management: pain level controlled Vital Signs Assessment: post-procedure vital signs reviewed and stable Respiratory status: spontaneous breathing, nonlabored ventilation, respiratory function stable and patient connected to nasal cannula oxygen Cardiovascular status: blood pressure returned to baseline and stable Postop Assessment: no apparent nausea or vomiting Anesthetic complications: no     Last Vitals:  Vitals:   11/13/19 1501 11/13/19 1516  BP: (!) 161/91 (!) 158/74  Pulse: 99 100  Resp: 10 16  Temp: 36.4 C 36.7 C  SpO2: 100% 100%    Last Pain:  Vitals:   11/13/19 1516  TempSrc: Tympanic  PainSc: 2                  Precious Haws Wardell Pokorski

## 2019-11-13 NOTE — Discharge Instructions (Addendum)
AMBULATORY SURGERY  DISCHARGE INSTRUCTIONS   1) The drugs that you were given will stay in your system until tomorrow so for the next 24 hours you should not:  A) Drive an automobile B) Make any legal decisions C) Drink any alcoholic beverage   2) You may resume regular meals tomorrow.  Today it is better to start with liquids and gradually work up to solid foods.  You may eat anything you prefer, but it is better to start with liquids, then soup and crackers, and gradually work up to solid foods.   3) Please notify your doctor immediately if you have any unusual bleeding, trouble breathing, redness and pain at the surgery site, drainage, fever, or pain not relieved by medication.    4) Additional Instructions:        Please contact your physician with any problems or Same Day Surgery at [phone], Monday through Friday 6 am to 4 pm, or [place] [facility] number at [phone]. Remove Band-Aids on Sunday then okay to shower. Minimize activities through the weekend. Pain medicine as previously directed.. Call office if you are having problems.

## 2019-11-13 NOTE — Transfer of Care (Signed)
Immediate Anesthesia Transfer of Care Note  Patient: [name]  Procedure(s) Performed: T12 KYPHOPLASTY (N/A Thoracic)  Patient Location: PACU  Anesthesia Type:MAC  Level of Consciousness: awake, alert  and oriented  Airway & Oxygen Therapy: Patient Spontanous Breathing and Patient connected to nasal cannula oxygen  Post-op Assessment: Report given to RN and Post -op Vital signs reviewed and stable  Post vital signs: Reviewed and stable  Last Vitals:  Vitals Value Taken Time  BP 147/75 11/13/19 1422  Temp    Pulse 106 11/13/19 1423  Resp 13 11/13/19 1423  SpO2 100 % 11/13/19 1423  Vitals shown include unvalidated device data.  Last Pain:  Vitals:   11/13/19 1223  TempSrc: Tympanic  PainSc: 5       Patients Stated Pain Goal: 0 (01/41/03 0131)  Complications: No apparent anesthesia complications

## 2019-11-13 NOTE — Anesthesia Preprocedure Evaluation (Signed)
Anesthesia Evaluation  Patient identified by MRN, date of birth, ID band Patient awake    Reviewed: Allergy & Precautions, H&P , NPO status , Patient's Chart, lab work & pertinent test results  History of Anesthesia Complications Negative for: history of anesthetic complications  Airway Mallampati: III  TM Distance: >3 FB Neck ROM: limited    Dental  (+) Chipped   Pulmonary neg shortness of breath, asthma , pneumonia,           Cardiovascular Exercise Tolerance: Good hypertension, (-) angina(-) Past MI and (-) DOE      Neuro/Psych negative neurological ROS  negative psych ROS   GI/Hepatic Neg liver ROS, GERD  Medicated and Controlled,  Endo/Other  negative endocrine ROS  Renal/GU negative Renal ROS  negative genitourinary   Musculoskeletal   Abdominal   Peds  Hematology negative hematology ROS (+)   Anesthesia Other Findings Past Medical History: No date: Asthma No date: Broken arm     Comment:  Pt was 19 07/2018: Cancer ([place])     Comment:  RENAL, 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 No date: Cataract cortical, senile No date: Colon polyps No date: GERD (gastroesophageal reflux disease) No date: Hyperlipidemia No date: Hypertension No date: Pneumonia     Comment:  AS A CHILD  Past Surgical History: 2001, 2006, 2012: COLONOSCOPY 09/11/2000: ESOPHAGOGASTRODUODENOSCOPY 03/02/1997: FLEXIBLE SIGMOIDOSCOPY No date: NASAL POLYP EXCISION 11/17/2018: ROBOT ASSISTED LAPAROSCOPIC NEPHRECTOMY; Right     Comment:  Procedure: ROBOTIC ASSISTED LAPAROSCOPIC NEPHRECTOMY;                Surgeon: Hollice Espy, MD;  Location: [facility] ORS;                Service: Urology;  Laterality: Right;  BMI    Body Mass Index: 23.09 kg/m      Reproductive/Obstetrics negative OB ROS                             Anesthesia Physical Anesthesia Plan  ASA: III  Anesthesia Plan: General   Post-op Pain Management:     Induction: Intravenous  PONV Risk Score and Plan: Propofol infusion and TIVA  Airway Management Planned: Natural Airway and Nasal Cannula  Additional Equipment:   Intra-op Plan:   Post-operative Plan:   Informed Consent: I have reviewed the patients History and Physical, chart, labs and discussed the procedure including the risks, benefits and alternatives for the proposed anesthesia with the patient or authorized representative who has indicated his/her understanding and acceptance.     Dental Advisory Given  Plan Discussed with: Anesthesiologist, CRNA and Surgeon  Anesthesia Plan Comments: (Patient consented for risks of anesthesia including but not limited to:  - adverse reactions to medications - risk of intubation if required - damage to teeth, lips or other oral mucosa - sore throat or hoarseness - Damage to heart, brain, lungs or loss of life  Patient voiced understanding.)        Anesthesia Quick Evaluation

## 2019-11-13 NOTE — H&P (Signed)
Reviewed paper H+P, will be scanned into chart. No changes noted.  

## 2019-11-13 NOTE — Op Note (Signed)
Date 11/13/19  Time  2:18 pm   PATIENT:  Kevin [name]   PRE-OPERATIVE DIAGNOSIS:  closed wedge compression fracture of T12   POST-OPERATIVE DIAGNOSIS:  closed wedge compression fracture of T12   PROCEDURE:  Procedure(s): KYPHOPLASTY T12  SURGEON: Laurene Footman, MD   ASSISTANTS: None   ANESTHESIA:   local and MAC   EBL:  No intake/output data recorded.   BLOOD ADMINISTERED:none   DRAINS: none    LOCAL MEDICATIONS USED:  MARCAINE    and XYLOCAINE    SPECIMEN:   None   DISPOSITION OF SPECIMEN:  Not applicable   COUNTS:  YES   TOURNIQUET:  * No tourniquets in log *   IMPLANTS: Bone cement   DICTATION: .Dragon Dictation  patient was brought to the operating room and after adequate anesthesia was obtained the patient was placed prone.  C arm was brought in in good visualization of the affected level obtained on both AP and lateral projections.  After patient identification and timeout procedures were completed, local anesthetic was infiltrated with 10 cc 1% Xylocaine infiltrated subcutaneously.  This is done the area on the each side of the planned approach.  The back was then prepped and draped in the usual sterile manner and repeat timeout procedure carried out.  A spinal needle was brought down to the pedicle on the each side of  T12 and a 50-50 mix of 1% Xylocaine half percent Sensorcaine with epinephrine total of 20 cc injected.  After allowing this to set a small incision was made and the trocar was advanced into the vertebral body in an extrapedicular fashion.  Biopsy was not obtained on either side Drilling was carried out balloon inserted with inflation to  3 cc to each side.  When the cement was appropriate consistency a total of 6-1/2 cc were injected into the vertebral body without extravasation, good fill superior to inferior endplates and from right to left sides along the inferior endplate.  After the cement had set the trochar was removed and permanent C-arm views  obtained.  The wound was closed with Dermabond followed by Band-Aid   PLAN OF CARE: Discharge to home after PACU   PATIENT DISPOSITION:  PACU - hemodynamically stable.

## 2019-11-19 NOTE — H&P (Signed)
Chief Complaint  Patient presents with  . Establish Care  Back pain, T12 Compr Fx   History of the Present Illness: Kevin [name] is a 86 y.o. male here for evaluation of T12 compression fracture. This is the patient's initial visit. The patient states the pain started 1 week after Christmas 2020. He states he has been sleeping in a chair due to pain and the inability to get out of bed. The patient presents with an adult male who states the patient stoops more than prior to the fracture. She also tells me the patient has been taking Tylenol and a mild oxycodone, which he typically takes before he goes to sleep. The patient is not on an anticoagulant.   The patient is now retired, and previously worked in [employer], research (medical). The companion states they drive to purchase their own groceries, and they go to the gym 3 times a week. She states they both had COVID in 04/2019 and have now had their first injection of the vaccine.   I have reviewed past medical, surgical, social and family history, and allergies as documented in the EMR.  Past Medical History: Past Medical History:  Diagnosis Date  . Allergic state all my life  . Arthritis some  . Asthma without status asthmaticus, unspecified since birth  . Bifascicular block  . Cancer (CMS-HCC) skin  . Cataract cortical, senile unknown  . Colon polyps  . Congenital foot deformity  . Dermatophytosis of nail  . FH: colon cancer 04/01/2018  . GERD (gastroesophageal reflux disease)  . History of adenomatous polyp of colon 04/01/2018  . History of colonic polyps  . Hyperlipidemia 20 years  . Hypertension 20+years  . Parkinson's disease (CMS-HCC)  . Weight loss, unintentional 04/01/2018   Past Surgical History: Past Surgical History:  Procedure Laterality Date  . COLONOSCOPY 02/05/2011  07/27/2005, 09/11/2000; Adenomatous Polyps: CBF 01/2016; No repeat due to age per RTE (dw)  . EGD 09/11/2000  . EXCISION NASAL POLYPS  . FLEXIBLE SIGMOIDOSCOPY 03/02/1997   Adenomatous Polyp  . kidney removal Right 10/2018  . nose straightened   Past Family History: Family History  Problem Relation Age of Onset  . High blood pressure (Hypertension) Other  . Asthma Father  . Colon polyps Sister  . High blood pressure (Hypertension) Mother   Medications: Current Outpatient Medications Ordered in Epic  Medication Sig Dispense Refill  . acetaminophen (TYLENOL) 325 MG tablet Take 650 mg by mouth every 6 (six) hours as needed for Pain  . amLODIPine (NORVASC) 5 MG tablet TAKE 1 TABLET ONCE DAILY 90 tablet 0  . carbidopa-levodopa (SINEMET) 25-100 mg tablet Take one tablet in the morning, one tablet in the afternoon, one tablet at night, and one tablet in the middle of the night when he wakes up. 360 tablet 1  . diclofenac (VOLTAREN) 1 % topical gel Apply 2 g topically 4 (four) times daily  . docusate (COLACE) 100 MG capsule Take 100 mg by mouth once daily  . hydroCHLOROthiazide (HYDRODIURIL) 25 MG tablet TAKE 1 TABLET ONCE DAILY 90 tablet 0  . polyethylene glycol (MIRALAX) powder Take 17 g by mouth once daily Mix in 4-8ounces of fluid prior to taking.  . simvastatin (ZOCOR) 10 MG tablet TAKE 1 TABLET ONCE DAILY 90 tablet 0   No current Epic-ordered facility-administered medications on file.   Allergies: No Known Allergies   Body mass index is 22.95 kg/m.  Review of Systems: A comprehensive 14 point ROS was performed, reviewed, and the pertinent orthopaedic  findings are documented in the HPI.  Vitals:  11/11/19 0838  BP: 122/70   General Physical Examination:  General/Constitutional: No apparent distress: well-nourished and well developed. Eyes: Pupils equal, round with synchronous movement. Lungs: Clear to auscultation HEENT: Normal Vascular: No edema, swelling or tenderness, except as noted in detailed exam. Cardiac: Heart rate and rhythm is regular. Integumentary: No impressive skin lesions present, except as noted in detailed  exam. Neuro/Psych: Normal mood and affect, oriented to person, place and time.  Musculoskeletal Examination: On exam, the patient is tender and swollen at T12. Lungs are clear. Heart rate and rhythm is normal. HEENT is normal.  Radiographs: MRI on 11/10/2019 that showed extensive edema and fluid collection in the anterior portion of T12, consistent with a severe compression fracture, probable nonunion.   Assessment: ICD-10-CM  1. Thoracic compression fracture, closed, initial encounter (CMS-HCC) S22.000A   Plan: The patient has clinical findings of severe anterior T12 compression fracture with extensive edema and fluid collection.   We discussed the patient's MRI findings, the reason for the swelling and the patient stooping more than previous. I recommended and we discussed kyphoplasty. I also used the bone model and gave them a brochure to help further explain the surgery in detail. I told them this was a same day surgery. The patient would like to proceed.   Plan is for T12 kyphoplasty in the near future.   Surgical Risks:  The nature of the condition and the proposed procedure has been reviewed in detail with the patient. Surgical versus non-surgical options and prognosis for recovery have been reviewed and the inherent risks and benefits of each have been discussed including the risks of infection, bleeding, injury to nerves / blood vessels / tendons, incomplete relief of symptoms, persisting pain and / or stiffness, loss of function, complex regional pain syndrome, failure of procedure, as appropriate.  Teeth: All natural   Scribe Attestation: I, Dawn Royse, am acting as scribe for [employer], MD    Electronically signed by Lauris Poag, MD at 11/12/2019 7:57 PM EST

## 2019-11-30 ENCOUNTER — Other Ambulatory Visit: Payer: Self-pay | Admitting: Orthopedic Surgery

## 2019-12-01 ENCOUNTER — Other Ambulatory Visit: Payer: Self-pay

## 2019-12-01 ENCOUNTER — Encounter
Admission: RE | Admit: 2019-12-01 | Discharge: 2019-12-01 | Disposition: A | Discharge status: Home / Self Care | Payer: Medicare HMO | Source: Ambulatory Visit | Attending: Orthopedic Surgery | Admitting: Orthopedic Surgery

## 2019-12-01 DIAGNOSIS — I10 Essential (primary) hypertension: Secondary | ICD-10-CM | POA: Insufficient documentation

## 2019-12-01 DIAGNOSIS — Z20822 Contact with and (suspected) exposure to covid-19: Secondary | ICD-10-CM | POA: Diagnosis not present

## 2019-12-01 DIAGNOSIS — Z01818 Encounter for other preprocedural examination: Secondary | ICD-10-CM | POA: Insufficient documentation

## 2019-12-01 LAB — BASIC METABOLIC PANEL
Anion gap: 9 (ref 5–15)
BUN: 25 mg/dL — ABNORMAL HIGH (ref 8–23)
CO2: 27 mmol/L (ref 22–32)
Calcium: 9.2 mg/dL (ref 8.9–10.3)
Chloride: 93 mmol/L — ABNORMAL LOW (ref 98–111)
Creatinine, Ser: 1.42 mg/dL — ABNORMAL HIGH (ref 0.61–1.24)
GFR calc Af Amer: 52 mL/min — ABNORMAL LOW (ref >60)
GFR calc non Af Amer: 45 mL/min — ABNORMAL LOW (ref >60)
Glucose, Bld: 116 mg/dL — ABNORMAL HIGH (ref 70–99)
Potassium: 3.7 mmol/L (ref 3.5–5.1)
Sodium: 129 mmol/L — ABNORMAL LOW (ref 135–145)

## 2019-12-01 LAB — SARS CORONAVIRUS 2 (TAT 6-24 HRS): SARS Coronavirus 2: NEGATIVE

## 2019-12-01 LAB — CBC
HCT: 35.6 % — ABNORMAL LOW (ref 39.0–52.0)
Hemoglobin: 12.2 g/dL — ABNORMAL LOW (ref 13.0–17.0)
MCH: 30.5 pg (ref 26.0–34.0)
MCHC: 34.3 g/dL (ref 30.0–36.0)
MCV: 89 fL (ref 80.0–100.0)
Platelets: 230 10*3/uL (ref 150–400)
RBC: 4 MIL/uL — ABNORMAL LOW (ref 4.22–5.81)
RDW: 12.6 % (ref 11.5–15.5)
WBC: 4.5 10*3/uL (ref 4.0–10.5)
nRBC: 0 % (ref 0.0–0.2)

## 2019-12-01 NOTE — Patient Instructions (Addendum)
Your procedure is scheduled on: Thursday, February 11 Report to Day Surgery on the 2nd floor of the [facility]. To find out your arrival time, please call [phone] between 1PM - 3PM on: Wednesday, February 10  REMEMBER: Instructions that are not followed completely may result in serious medical risk, up to and including death; or upon the discretion of your surgeon and anesthesiologist your surgery may need to be rescheduled.  Do not eat food after midnight the night before surgery.  No gum chewing, lozengers or hard candies.  You may however, drink CLEAR liquids up to 2 hours before you are scheduled to arrive for your surgery. Do not drink anything within 2 hours of the start of your surgery.  Clear liquids include: - water  - apple juice without pulp - gatorade - no red - black coffee or tea (Do NOT add milk or creamers to the coffee or tea) Do NOT drink anything that is not on this list.  No Alcohol for 24 hours before or after surgery.  No Smoking including e-cigarettes for 24 hours prior to surgery.  No chewable tobacco products for at least 6 hours prior to surgery.  No nicotine patches on the day of surgery.  On the morning of surgery brush your teeth with toothpaste and water, you may rinse your mouth with mouthwash if you wish. Do not swallow any toothpaste or mouthwash.  Notify your doctor if there is any change in your medical condition (cold, fever, infection).  Do not wear jewelry, make-up, hairpins, clips or nail polish.  Do not wear lotions, powders, or perfumes.   Do not shave 48 hours prior to surgery.   Contacts and dentures may not be worn into surgery.  Do not bring valuables to the hospital, including drivers license, insurance or credit cards.  [facility] is not responsible for any belongings or valuables.   TAKE THESE MEDICATIONS THE MORNING OF SURGERY:  1.  Albuterol inhaler if not expired 2.  Amlodipine 3.  Carbidopa-levodopa 4.   Oxycodone or tylenol if needed for pain  Use CHG Soap or wipes as directed on instruction sheet.  Use inhalers on the day of surgery and bring to the hospital if not expired.  Stop Anti-inflammatories (NSAIDS) such as Advil, Aleve, Ibuprofen, Motrin, Naproxen, Naprosyn and Aspirin based products such as Excedrin, Goodys Powder, BC Powder. (May take Tylenol or Acetaminophen if needed.)  Stop ANY OVER THE COUNTER supplements until after surgery. (May continue multivitamin.)  Wear comfortable clothing (specific to your surgery type) to the hospital.  If you are being discharged the day of surgery, you will not be allowed to drive home. You will need a responsible adult to drive you home and stay with you that night.   If you are taking public transportation, you will need to have a responsible adult with you. Please confirm with your physician that it is acceptable to use public transportation.   Please call [phone] if you have any questions about these instructions.

## 2019-12-02 ENCOUNTER — Other Ambulatory Visit: Payer: Self-pay | Admitting: Orthopedic Surgery

## 2019-12-02 ENCOUNTER — Other Ambulatory Visit: Payer: Medicare HMO

## 2019-12-02 NOTE — Pre-Procedure Instructions (Signed)
Pre-Admit Testing Provider Notification Note  Provider Notified: Dr. Rudene Christians  Notification Mode:  (1) Fax (2) Secure Chat  Reason:  (1) Abnormal Labs & No orders. (2) No orders. "There are no orders for this patient who is having surgery tomorrow. We are sending this chart up to Pre-Op today. Thank you."   Response:  (1) Fax confirmation received. (2) Awaiting secure chat response.  Additional Information: Placed on chart. Noted on Pre-Admit worksheet.  Signed: Beulah Gandy, RN

## 2019-12-03 ENCOUNTER — Ambulatory Visit
Admission: RE | Admit: 2019-12-03 | Discharge: 2019-12-03 | Disposition: A | Discharge status: Home / Self Care | Payer: Medicare HMO | Attending: Orthopedic Surgery | Admitting: Orthopedic Surgery

## 2019-12-03 ENCOUNTER — Encounter: Payer: Self-pay | Admitting: Orthopedic Surgery

## 2019-12-03 ENCOUNTER — Ambulatory Visit: Payer: Medicare HMO

## 2019-12-03 ENCOUNTER — Other Ambulatory Visit: Payer: Self-pay

## 2019-12-03 ENCOUNTER — Ambulatory Visit: Payer: Medicare HMO | Admitting: Registered Nurse

## 2019-12-03 ENCOUNTER — Encounter
Admission: RE | Disposition: A | Discharge status: Home / Self Care | Payer: Self-pay | Source: Home / Self Care | Attending: Orthopedic Surgery

## 2019-12-03 DIAGNOSIS — Z9889 Other specified postprocedural states: Secondary | ICD-10-CM | POA: Insufficient documentation

## 2019-12-03 DIAGNOSIS — Z905 Acquired absence of kidney: Secondary | ICD-10-CM | POA: Insufficient documentation

## 2019-12-03 DIAGNOSIS — Z85528 Personal history of other malignant neoplasm of kidney: Secondary | ICD-10-CM | POA: Insufficient documentation

## 2019-12-03 DIAGNOSIS — Z85828 Personal history of other malignant neoplasm of skin: Secondary | ICD-10-CM | POA: Insufficient documentation

## 2019-12-03 DIAGNOSIS — J45909 Unspecified asthma, uncomplicated: Secondary | ICD-10-CM | POA: Diagnosis not present

## 2019-12-03 DIAGNOSIS — Z79899 Other long term (current) drug therapy: Secondary | ICD-10-CM | POA: Diagnosis not present

## 2019-12-03 DIAGNOSIS — G20 Parkinson's disease: Secondary | ICD-10-CM | POA: Insufficient documentation

## 2019-12-03 DIAGNOSIS — E785 Hyperlipidemia, unspecified: Secondary | ICD-10-CM | POA: Diagnosis not present

## 2019-12-03 DIAGNOSIS — M4856XA Collapsed vertebra, not elsewhere classified, lumbar region, initial encounter for fracture: Secondary | ICD-10-CM | POA: Insufficient documentation

## 2019-12-03 DIAGNOSIS — I10 Essential (primary) hypertension: Secondary | ICD-10-CM | POA: Diagnosis not present

## 2019-12-03 DIAGNOSIS — T148XXA Other injury of unspecified body region, initial encounter: Secondary | ICD-10-CM

## 2019-12-03 HISTORY — PX: KYPHOPLASTY: SHX5884

## 2019-12-03 SURGERY — KYPHOPLASTY
Anesthesia: General | Site: Back

## 2019-12-03 MED ORDER — PROPOFOL 500 MG/50ML IV EMUL
INTRAVENOUS | Status: AC
  Filled 2019-12-03: qty 50

## 2019-12-03 MED ORDER — CHLORHEXIDINE GLUCONATE 4 % EX LIQD
60.0000 mL | Freq: Once | CUTANEOUS | Status: AC
  Administered 2019-12-03: 4 via TOPICAL

## 2019-12-03 MED ORDER — LIDOCAINE HCL (CARDIAC) PF 100 MG/5ML IV SOSY
PREFILLED_SYRINGE | INTRAVENOUS | Status: DC | PRN
  Administered 2019-12-03: 100 mg via INTRAVENOUS

## 2019-12-03 MED ORDER — BUPIVACAINE-EPINEPHRINE (PF) 0.5% -1:200000 IJ SOLN
INTRAMUSCULAR | Status: AC
  Filled 2019-12-03: qty 30

## 2019-12-03 MED ORDER — FENTANYL CITRATE (PF) 100 MCG/2ML IJ SOLN: 25.0000 ug | INTRAMUSCULAR | Status: DC | PRN

## 2019-12-03 MED ORDER — LIDOCAINE HCL 1 % IJ SOLN
INTRAMUSCULAR | Status: DC | PRN
  Administered 2019-12-03: 20 mL

## 2019-12-03 MED ORDER — OXYCODONE-ACETAMINOPHEN 5-325 MG PO TABS: 1.0000 | ORAL_TABLET | Freq: Three times a day (TID) | ORAL | 0 refills | Status: DC | PRN

## 2019-12-03 MED ORDER — SODIUM CHLORIDE 0.9 % IV SOLN: INTRAVENOUS | Status: DC

## 2019-12-03 MED ORDER — ONDANSETRON HCL 4 MG/2ML IJ SOLN: 4.0000 mg | Freq: Four times a day (QID) | INTRAMUSCULAR | Status: DC | PRN

## 2019-12-03 MED ORDER — METOCLOPRAMIDE HCL 10 MG PO TABS: 5.0000 mg | ORAL_TABLET | Freq: Three times a day (TID) | ORAL | Status: DC | PRN

## 2019-12-03 MED ORDER — PROPOFOL 10 MG/ML IV BOLUS
INTRAVENOUS | Status: DC | PRN
  Administered 2019-12-03: 30 mg via INTRAVENOUS
  Administered 2019-12-03: 20 mg via INTRAVENOUS

## 2019-12-03 MED ORDER — DEXMEDETOMIDINE HCL 200 MCG/2ML IV SOLN
INTRAVENOUS | Status: DC | PRN
  Administered 2019-12-03: 8 ug via INTRAVENOUS
  Administered 2019-12-03: 12 ug via INTRAVENOUS

## 2019-12-03 MED ORDER — CEFAZOLIN SODIUM-DEXTROSE 2-4 GM/100ML-% IV SOLN
2.0000 g | INTRAVENOUS | Status: AC
  Administered 2019-12-03: 2 g via INTRAVENOUS

## 2019-12-03 MED ORDER — CEFAZOLIN SODIUM-DEXTROSE 2-4 GM/100ML-% IV SOLN
INTRAVENOUS | Status: AC
  Filled 2019-12-03: qty 100

## 2019-12-03 MED ORDER — KETAMINE HCL 10 MG/ML IJ SOLN
INTRAMUSCULAR | Status: DC | PRN
  Administered 2019-12-03: 50 mg via INTRAVENOUS

## 2019-12-03 MED ORDER — ONDANSETRON HCL 4 MG PO TABS: 4.0000 mg | ORAL_TABLET | Freq: Four times a day (QID) | ORAL | Status: DC | PRN

## 2019-12-03 MED ORDER — IOHEXOL 180 MG/ML  SOLN
INTRAMUSCULAR | Status: DC | PRN
  Administered 2019-12-03: 20 mL

## 2019-12-03 MED ORDER — METOCLOPRAMIDE HCL 5 MG/ML IJ SOLN: 5.0000 mg | Freq: Three times a day (TID) | INTRAMUSCULAR | Status: DC | PRN

## 2019-12-03 MED ORDER — PROPOFOL 500 MG/50ML IV EMUL
INTRAVENOUS | Status: DC | PRN
  Administered 2019-12-03: 40 ug/kg/min via INTRAVENOUS

## 2019-12-03 MED ORDER — LIDOCAINE HCL (PF) 2 % IJ SOLN
INTRAMUSCULAR | Status: AC
  Filled 2019-12-03: qty 10

## 2019-12-03 MED ORDER — FAMOTIDINE 20 MG PO TABS: 20.0000 mg | ORAL_TABLET | Freq: Once | ORAL | Status: AC

## 2019-12-03 MED ORDER — DEXMEDETOMIDINE HCL IN NACL 80 MCG/20ML IV SOLN
INTRAVENOUS | Status: AC
  Filled 2019-12-03: qty 20

## 2019-12-03 MED ORDER — FAMOTIDINE 20 MG PO TABS
ORAL_TABLET | ORAL | Status: AC
  Administered 2019-12-03: 20 mg via ORAL
  Filled 2019-12-03: qty 1

## 2019-12-03 MED ORDER — SUGAMMADEX SODIUM 500 MG/5ML IV SOLN
INTRAVENOUS | Status: AC
  Filled 2019-12-03: qty 5

## 2019-12-03 MED ORDER — LACTATED RINGERS IV SOLN: INTRAVENOUS | Status: DC

## 2019-12-03 MED ORDER — BUPIVACAINE-EPINEPHRINE (PF) 0.5% -1:200000 IJ SOLN
INTRAMUSCULAR | Status: DC | PRN
  Administered 2019-12-03: 10 mL via PERINEURAL

## 2019-12-03 SURGICAL SUPPLY — 20 items
BNDG ADH 2 X3.75 FABRIC TAN LF (GAUZE/BANDAGES/DRESSINGS) ×1 IMPLANT
CEMENT KYPHON CX01A KIT/MIXER (Cement) ×2 IMPLANT
COVER WAND RF STERILE (DRAPES) ×2 IMPLANT
DERMABOND ADVANCED (GAUZE/BANDAGES/DRESSINGS) ×1
DERMABOND ADVANCED .7 DNX12 (GAUZE/BANDAGES/DRESSINGS) ×1 IMPLANT
DEVICE BIOPSY BONE KYPHX (INSTRUMENTS) ×2 IMPLANT
DRAPE C-ARM XRAY 36X54 (DRAPES) ×2 IMPLANT
DURAPREP 26ML APPLICATOR (WOUND CARE) ×2 IMPLANT
FEE RENTAL RFA GENERATOR (MISCELLANEOUS) IMPLANT
GLOVE SURG SYN 9.0  PF PI (GLOVE) ×1
GLOVE SURG SYN 9.0 PF PI (GLOVE) ×1 IMPLANT
GOWN SRG 2XL LVL 4 RGLN SLV (GOWNS) ×1 IMPLANT
GOWN STRL NON-REIN 2XL LVL4 (GOWNS) ×1
GOWN STRL REUS W/ TWL LRG LVL3 (GOWN DISPOSABLE) ×1 IMPLANT
GOWN STRL REUS W/TWL LRG LVL3 (GOWN DISPOSABLE) ×1
PACK KYPHOPLASTY (MISCELLANEOUS) ×2 IMPLANT
RENTAL RFA GENERATOR (MISCELLANEOUS) IMPLANT
STRAP SAFETY 5IN WIDE (MISCELLANEOUS) ×2 IMPLANT
TRAY KYPHOPAK 15/3 EXPRESS 1ST (MISCELLANEOUS) ×1 IMPLANT
TRAY KYPHOPAK 20/3 EXPRESS 1ST (MISCELLANEOUS) ×2 IMPLANT

## 2019-12-03 NOTE — Discharge Instructions (Signed)
AMBULATORY SURGERY  DISCHARGE INSTRUCTIONS   1) The drugs that you were given will stay in your system until tomorrow so for the next 24 hours you should not:  A) Drive an automobile B) Make any legal decisions C) Drink any alcoholic beverage   2) You may resume regular meals tomorrow.  Today it is better to start with liquids and gradually work up to solid foods.  You may eat anything you prefer, but it is better to start with liquids, then soup and crackers, and gradually work up to solid foods.   3) Please notify your doctor immediately if you have any unusual bleeding, trouble breathing, redness and pain at the surgery site, drainage, fever, or pain not relieved by medication. 4) She will take it easy the next 2 days try not to do any lifting.  Residual for the next 2 weeks try to avoid bending at the waist and picking anything up related already to chair.  Remove Band-Aids in 2 days and then okay to shower.  Pain medicine as directed.  5) Your post-operative visit with Dr.                                     is: Date:                        Time:    Please call to schedule your post-operative visit.  6) Additional Instructions:

## 2019-12-03 NOTE — Transfer of Care (Signed)
Immediate Anesthesia Transfer of Care Note  Patient: Kevin [name]  Procedure(s) Performed: L1 KYPHOPLASTY (N/A Back)  Patient Location: PACU  Anesthesia Type:General  Level of Consciousness: drowsy  Airway & Oxygen Therapy: Patient Spontanous Breathing and Patient connected to face mask oxygen  Post-op Assessment: Report given to RN and Post -op Vital signs reviewed and stable  Post vital signs: Reviewed and stable  Last Vitals:  Vitals Value Taken Time  BP 127/70 12/03/19 1254  Temp    Pulse 84 12/03/19 1256  Resp 9 12/03/19 1256  SpO2 98 % 12/03/19 1256  Vitals shown include unvalidated device data.  Last Pain:  Vitals:   12/03/19 0954  TempSrc: Tympanic         Complications: No apparent anesthesia complications

## 2019-12-03 NOTE — Op Note (Signed)
Date: December 03, 2019  time 12:57 PM   PATIENT:  Kevin [name]   PRE-OPERATIVE DIAGNOSIS:  closed wedge compression fracture of L-1   POST-OPERATIVE DIAGNOSIS:  closed wedge compression fracture of L1   PROCEDURE:  Procedure(s): KYPHOPLASTY L1   SURGEON: Laurene Footman, MD   ASSISTANTS: None   ANESTHESIA:   local and MAC   EBL:  No intake/output data recorded.   BLOOD ADMINISTERED:none   DRAINS: none    LOCAL MEDICATIONS USED:  MARCAINE    and XYLOCAINE    SPECIMEN:   None   DISPOSITION OF SPECIMEN:  Not applicable   COUNTS:  YES   TOURNIQUET:  * No tourniquets in log *   IMPLANTS: Bone cement   DICTATION: .Dragon Dictation  patient was brought to the operating room and after adequate anesthesia was obtained the patient was placed prone.  C arm was brought in in good visualization of the affected level obtained on both AP and lateral projections.  After patient identification and timeout procedures were completed, local anesthetic was infiltrated with 10 cc 1% Xylocaine infiltrated subcutaneously.  This is done the area on the right side of the planned approach.  The back was then prepped and draped in the usual sterile manner and repeat timeout procedure carried out.  A spinal needle was brought down to the pedicle on the right side of  L1 and a 50-50 mix of 1% Xylocaine half percent Sensorcaine with epinephrine total of 20 cc injected.  After allowing this to set a small incision was made and the trocar was advanced into the vertebral body in an extrapedicular fashion.  Biopsy was not obtained Drilling was carried out balloon inserted with inflation to  5-1/2 cc.  When the cement was appropriate consistency 6-1/2 cc were injected into the vertebral body without extravasation, good fill superior to inferior endplates and from right to left sides along the inferior endplate.  After the cement had set the trochar was removed and permanent C-arm views obtained.  The wound was closed  with Dermabond followed by Band-Aid   PLAN OF CARE: Discharge to home after PACU   PATIENT DISPOSITION:  PACU - hemodynamically stable.

## 2019-12-03 NOTE — Anesthesia Preprocedure Evaluation (Signed)
Anesthesia Evaluation  Patient identified by MRN, date of birth, ID band Patient awake    Reviewed: Allergy & Precautions, H&P , NPO status , Patient's Chart, lab work & pertinent test results  History of Anesthesia Complications Negative for: history of anesthetic complications  Airway Mallampati: III  TM Distance: >3 FB Neck ROM: limited    Dental  (+) Chipped   Pulmonary neg shortness of breath, asthma , pneumonia, neg recent URI,           Cardiovascular Exercise Tolerance: Good hypertension, (-) angina(-) Past MI and (-) DOE      Neuro/Psych negative neurological ROS  negative psych ROS   GI/Hepatic Neg liver ROS, GERD  Medicated and Controlled,  Endo/Other  negative endocrine ROS  Renal/GU negative Renal ROS  negative genitourinary   Musculoskeletal   Abdominal   Peds  Hematology negative hematology ROS (+)   Anesthesia Other Findings Past Medical History: No date: Asthma No date: Broken arm     Comment:  Pt was 19 07/2018: Cancer ([place])     Comment:  RENAL, 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 No date: Cataract cortical, senile No date: Colon polyps No date: GERD (gastroesophageal reflux disease) No date: Hyperlipidemia No date: Hypertension No date: Pneumonia     Comment:  AS A CHILD  Past Surgical History: 2001, 2006, 2012: COLONOSCOPY 09/11/2000: ESOPHAGOGASTRODUODENOSCOPY 03/02/1997: FLEXIBLE SIGMOIDOSCOPY No date: NASAL POLYP EXCISION 11/17/2018: ROBOT ASSISTED LAPAROSCOPIC NEPHRECTOMY; Right     Comment:  Procedure: ROBOTIC ASSISTED LAPAROSCOPIC NEPHRECTOMY;                Surgeon: Hollice Espy, MD;  Location: [facility] ORS;                Service: Urology;  Laterality: Right;  BMI    Body Mass Index: 23.09 kg/m      Reproductive/Obstetrics negative OB ROS                             Anesthesia Physical  Anesthesia Plan  ASA: III  Anesthesia Plan: General   Post-op Pain  Management:    Induction: Intravenous  PONV Risk Score and Plan: Propofol infusion and TIVA  Airway Management Planned: Natural Airway and Nasal Cannula  Additional Equipment:   Intra-op Plan:   Post-operative Plan:   Informed Consent: I have reviewed the patients History and Physical, chart, labs and discussed the procedure including the risks, benefits and alternatives for the proposed anesthesia with the patient or authorized representative who has indicated his/her understanding and acceptance.     Dental Advisory Given  Plan Discussed with: Anesthesiologist, CRNA and Surgeon  Anesthesia Plan Comments: (Patient consented for risks of anesthesia including but not limited to:  - adverse reactions to medications - risk of intubation if required - damage to teeth, lips or other oral mucosa - sore throat or hoarseness - Damage to heart, brain, lungs or loss of life  Patient voiced understanding.)        Anesthesia Quick Evaluation

## 2019-12-03 NOTE — H&P (Signed)
Reviewed paper H+P, will be scanned into chart. No changes noted.  

## 2019-12-04 NOTE — Anesthesia Postprocedure Evaluation (Signed)
Anesthesia Post Note  Patient: Kevin [name]  Procedure(s) Performed: L1 KYPHOPLASTY (N/A Back)  Patient location during evaluation: PACU Anesthesia Type: General Level of consciousness: awake and alert Pain management: pain level controlled Vital Signs Assessment: post-procedure vital signs reviewed and stable Respiratory status: spontaneous breathing, nonlabored ventilation, respiratory function stable and patient connected to nasal cannula oxygen Cardiovascular status: blood pressure returned to baseline and stable Postop Assessment: no apparent nausea or vomiting Anesthetic complications: no     Last Vitals:  Vitals:   12/03/19 1343 12/03/19 1405  BP: 131/78 (!) 154/88  Pulse: 78 85  Resp: 11 18  Temp:  (!) 36.3 C  SpO2: 98% 100%    Last Pain:  Vitals:   12/03/19 1405  TempSrc: Temporal  PainSc: 0-No pain                 Martha Clan

## 2020-01-11 IMAGING — CT CT ABDOMEN W/ CM
2 of 5 series · 16 of 46 positions shown, 18 images · IV contrast (iopamidol)
Comparison: 05/26/2018 abdominal MRI.

CLINICAL DATA: Bilateral renal lesions. Preop planning for partial
right nephrectomy.

EXAM:
CT ABDOMEN WITH CONTRAST
TECHNIQUE: Multidetector CT imaging of the abdomen was performed using the
standard protocol following bolus administration of intravenous
contrast.
CONTRAST:  100mL [id number] IOPAMIDOL ([id number]) INJECTION 61%

[Series 2: abd pelvis · axial · 0.70mm/px · z∈[-1475,-1235]mm · 13 of 56 slices shown, 15 images (1 of 2)]
[im 4/56  soft-tissue]
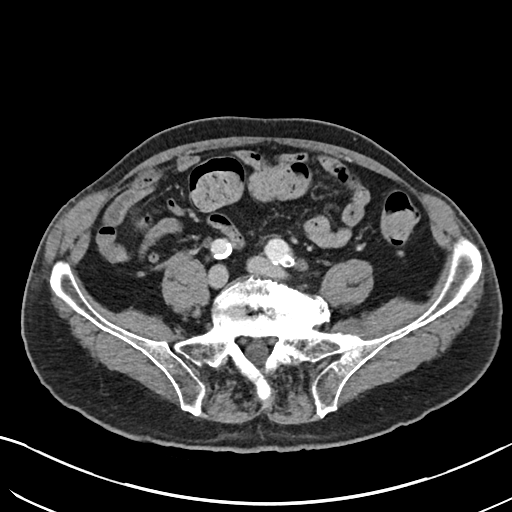
[im 4/56  bone]
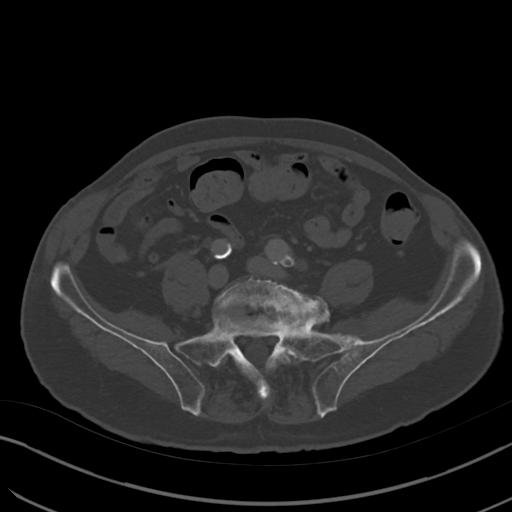
[im 8/56  soft-tissue]
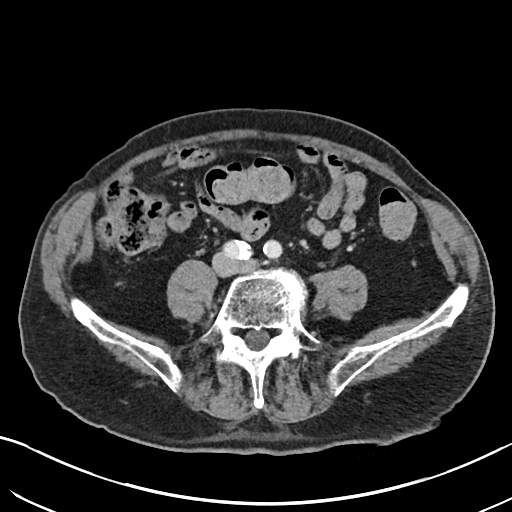
[im 12/56  soft-tissue]
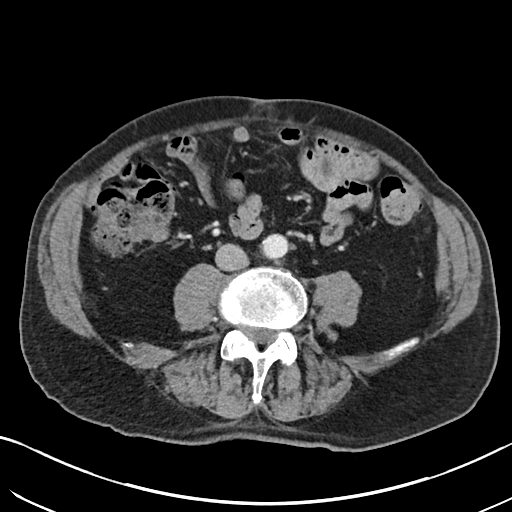
[im 15/56  soft-tissue]
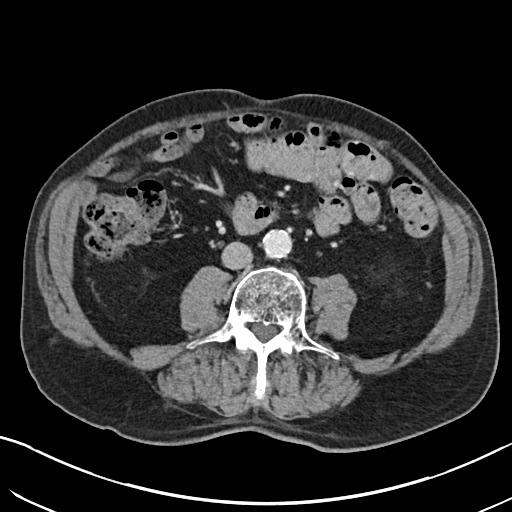
[im 19/56  soft-tissue]
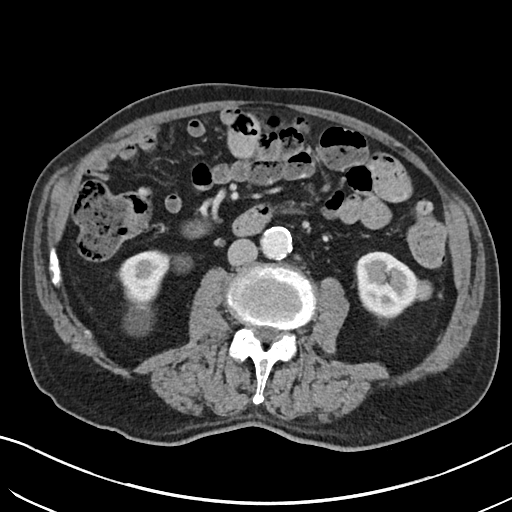
[im 23/56  soft-tissue]
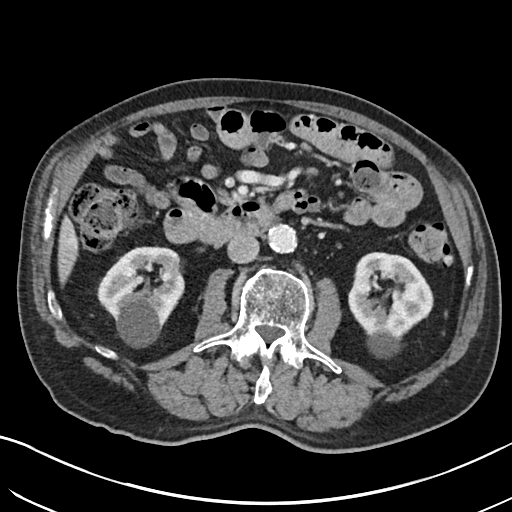
[im 30/56  soft-tissue]
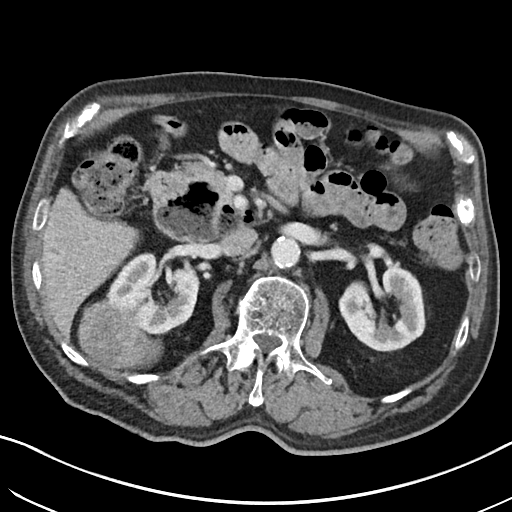
[im 34/56  soft-tissue]
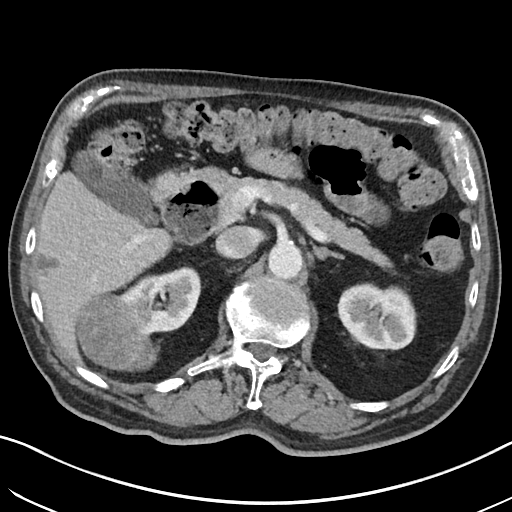
[im 37/56  soft-tissue]
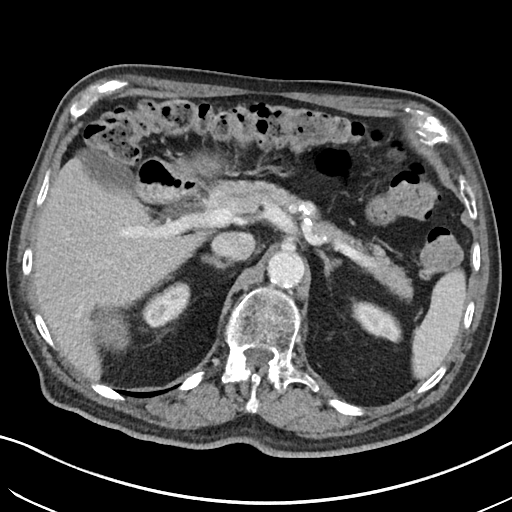
[im 37/56  bone]
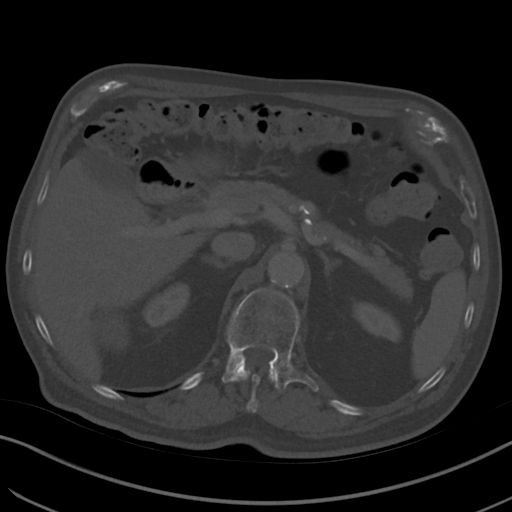
[im 41/56  soft-tissue]
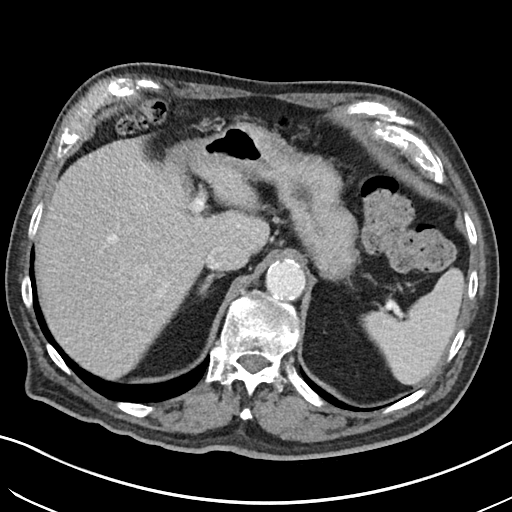
[im 45/56  soft-tissue]
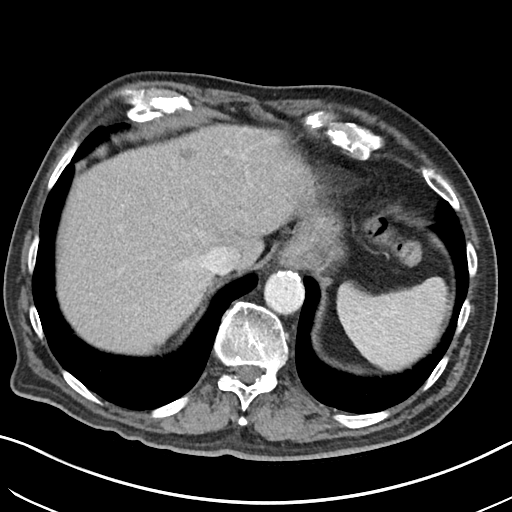
[im 48/56  soft-tissue]
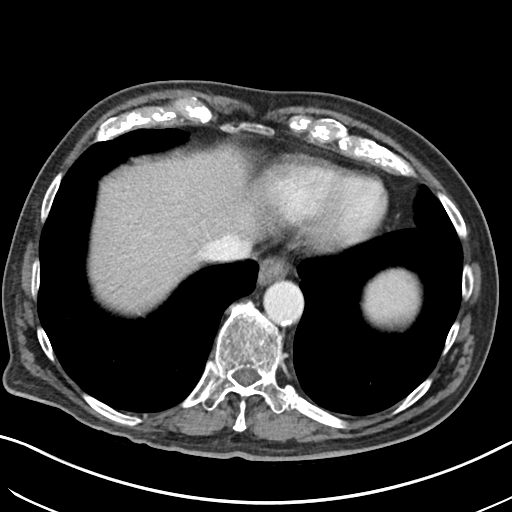
[im 52/56  soft-tissue]
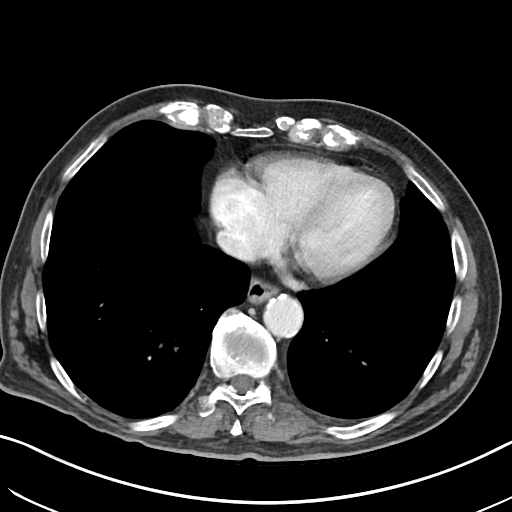

[Series 4: abd pelvis · coronal · 0.56mm/px · 3 of 149 slices shown (2 of 2)]
[im 50/149  soft-tissue]
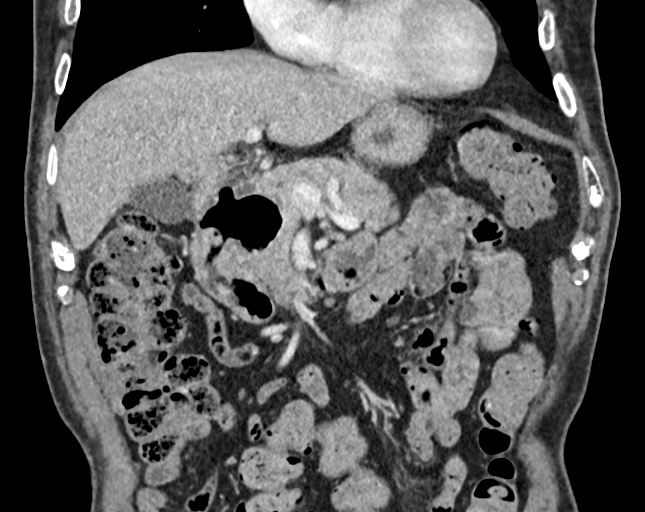
[im 66/149  soft-tissue]
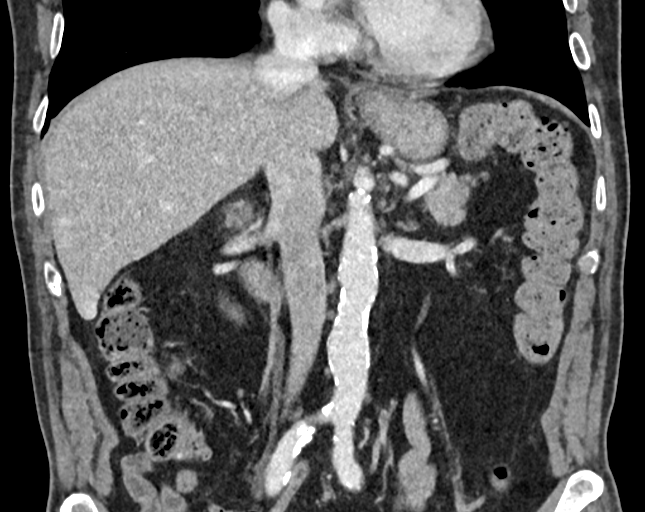
[im 83/149  soft-tissue]
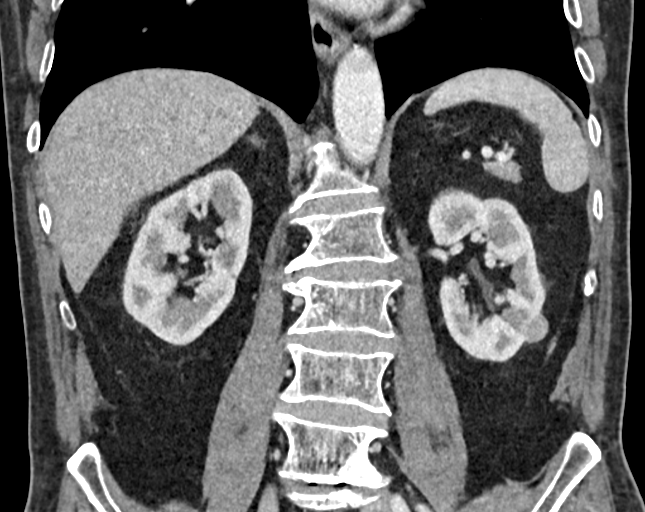

[16 of 46 positions shown; findings below may reference images not displayed]

FINDINGS: Lower chest: Bibasilar scarring. Normal heart size without
pericardial or pleural effusion. Tiny hiatal hernia.

Hepatobiliary: Hepatic cysts. Normal gallbladder, without biliary
ductal dilatation.

Pancreas: Normal, without mass or ductal dilatation.

Spleen: Normal in size, without focal abnormality.

Adrenals/Urinary Tract: Normal adrenal glands.

Posterior interpolar left renal 3.1 cm cyst or minimally complex
cyst.

Complex lateral lower pole left renal lesion measures maximally
cm on image 37/2 and is similar in size to 05/06/2018.

Right renal lesions which are consistent with cysts or minimally
complex cysts on the prior exam.

Primarily exophytic lateral interpolar right renal lesion measures
6.4 x 4.6 cm on image [DATE]. Similar to 6.7 x 4.7 cm on 05/06/2018.
5.2 cm craniocaudal. This again contacts the right lobe of the
liver, including image [DATE]. No gross invasion.

No hydronephrosis.

Stomach/Bowel: Proximal gastric underdistention. Scattered colonic
diverticula. Normal abdominal terminal ileum and appendix. Large
periampullary duodenal diverticulum. Otherwise normal small bowel.

Vascular/Lymphatic: Ectasia of the celiac is similar. Aortic and
branch vessel atherosclerosis. Patent renal veins. No
retroperitoneal or retrocrural adenopathy.

Other: No ascites.

Musculoskeletal: Convex left lumbar spine curvature. Degenerative
disc disease at L4-5.
IMPRESSION: 1. Similar size and appearance of an exophytic interpolar right
renal solid mass, consistent with renal cell carcinoma. No right
renal vein involvement or abdominal adenopathy.
2. Similar size of a complex left renal lesion which was detailed on
prior MRI.
3.  Aortic and branch vessel atherosclerosis.
4.  Tiny hiatal hernia.

## 2020-02-19 ENCOUNTER — Other Ambulatory Visit: Payer: Self-pay | Admitting: Internal Medicine

## 2020-02-19 DIAGNOSIS — M5430 Sciatica, unspecified side: Secondary | ICD-10-CM

## 2020-02-19 DIAGNOSIS — M4850XA Collapsed vertebra, not elsewhere classified, site unspecified, initial encounter for fracture: Secondary | ICD-10-CM

## 2020-03-04 ENCOUNTER — Other Ambulatory Visit: Payer: Self-pay

## 2020-03-04 ENCOUNTER — Ambulatory Visit
Admission: RE | Admit: 2020-03-04 | Discharge: 2020-03-04 | Disposition: A | Discharge status: Home / Self Care | Payer: Medicare HMO | Source: Ambulatory Visit | Attending: Internal Medicine | Admitting: Internal Medicine

## 2020-03-04 DIAGNOSIS — M549 Dorsalgia, unspecified: Secondary | ICD-10-CM | POA: Insufficient documentation

## 2020-03-04 DIAGNOSIS — M4850XA Collapsed vertebra, not elsewhere classified, site unspecified, initial encounter for fracture: Secondary | ICD-10-CM | POA: Insufficient documentation

## 2020-03-04 DIAGNOSIS — M5430 Sciatica, unspecified side: Secondary | ICD-10-CM | POA: Diagnosis present

## 2020-03-18 ENCOUNTER — Other Ambulatory Visit: Payer: Self-pay | Admitting: Neurology

## 2020-03-18 DIAGNOSIS — G20 Parkinson's disease: Secondary | ICD-10-CM

## 2020-04-01 ENCOUNTER — Ambulatory Visit
Admission: RE | Admit: 2020-04-01 | Discharge: 2020-04-01 | Disposition: A | Discharge status: Home / Self Care | Payer: Medicare HMO | Source: Ambulatory Visit | Attending: Neurology | Admitting: Neurology

## 2020-04-01 ENCOUNTER — Other Ambulatory Visit: Payer: Self-pay

## 2020-04-01 DIAGNOSIS — G20 Parkinson's disease: Secondary | ICD-10-CM | POA: Insufficient documentation

## 2020-05-05 ENCOUNTER — Other Ambulatory Visit: Payer: Self-pay

## 2020-05-05 ENCOUNTER — Encounter: Payer: Self-pay | Admitting: Ophthalmology

## 2020-05-12 ENCOUNTER — Other Ambulatory Visit: Payer: Self-pay

## 2020-05-12 ENCOUNTER — Other Ambulatory Visit
Admission: RE | Admit: 2020-05-12 | Discharge: 2020-05-12 | Disposition: A | Discharge status: Home / Self Care | Payer: Medicare HMO | Source: Ambulatory Visit | Attending: Ophthalmology | Admitting: Ophthalmology

## 2020-05-12 DIAGNOSIS — Z01812 Encounter for preprocedural laboratory examination: Secondary | ICD-10-CM | POA: Diagnosis present

## 2020-05-12 DIAGNOSIS — Z20822 Contact with and (suspected) exposure to covid-19: Secondary | ICD-10-CM | POA: Diagnosis not present

## 2020-05-12 LAB — SARS CORONAVIRUS 2 (TAT 6-24 HRS): SARS Coronavirus 2: NEGATIVE

## 2020-05-12 NOTE — Discharge Instructions (Signed)

## 2020-05-16 ENCOUNTER — Encounter
Admission: RE | Disposition: A | Discharge status: Home / Self Care | Payer: Self-pay | Source: Home / Self Care | Attending: Ophthalmology

## 2020-05-16 ENCOUNTER — Encounter: Payer: Self-pay | Admitting: Ophthalmology

## 2020-05-16 ENCOUNTER — Ambulatory Visit: Payer: Medicare HMO | Admitting: Anesthesiology

## 2020-05-16 ENCOUNTER — Ambulatory Visit
Admission: RE | Admit: 2020-05-16 | Discharge: 2020-05-16 | Disposition: A | Discharge status: Home / Self Care | Payer: Medicare HMO | Attending: Ophthalmology | Admitting: Ophthalmology

## 2020-05-16 ENCOUNTER — Other Ambulatory Visit: Payer: Self-pay

## 2020-05-16 DIAGNOSIS — G20 Parkinson's disease: Secondary | ICD-10-CM | POA: Insufficient documentation

## 2020-05-16 DIAGNOSIS — Z79899 Other long term (current) drug therapy: Secondary | ICD-10-CM | POA: Insufficient documentation

## 2020-05-16 DIAGNOSIS — M810 Age-related osteoporosis without current pathological fracture: Secondary | ICD-10-CM | POA: Diagnosis not present

## 2020-05-16 DIAGNOSIS — H2512 Age-related nuclear cataract, left eye: Secondary | ICD-10-CM | POA: Diagnosis not present

## 2020-05-16 DIAGNOSIS — D649 Anemia, unspecified: Secondary | ICD-10-CM | POA: Insufficient documentation

## 2020-05-16 DIAGNOSIS — F0280 Dementia in other diseases classified elsewhere without behavioral disturbance: Secondary | ICD-10-CM | POA: Diagnosis not present

## 2020-05-16 DIAGNOSIS — F329 Major depressive disorder, single episode, unspecified: Secondary | ICD-10-CM | POA: Insufficient documentation

## 2020-05-16 DIAGNOSIS — I10 Essential (primary) hypertension: Secondary | ICD-10-CM | POA: Insufficient documentation

## 2020-05-16 DIAGNOSIS — J45909 Unspecified asthma, uncomplicated: Secondary | ICD-10-CM | POA: Insufficient documentation

## 2020-05-16 DIAGNOSIS — K219 Gastro-esophageal reflux disease without esophagitis: Secondary | ICD-10-CM | POA: Insufficient documentation

## 2020-05-16 HISTORY — DX: Unspecified hearing loss, unspecified ear: H91.90

## 2020-05-16 HISTORY — DX: Anemia, unspecified: D64.9

## 2020-05-16 HISTORY — DX: Age-related osteoporosis without current pathological fracture: M81.0

## 2020-05-16 HISTORY — PX: CATARACT EXTRACTION W/PHACO: SHX586

## 2020-05-16 HISTORY — DX: Unspecified dementia, unspecified severity, without behavioral disturbance, psychotic disturbance, mood disturbance, and anxiety: F03.90

## 2020-05-16 SURGERY — PHACOEMULSIFICATION, CATARACT, WITH IOL INSERTION
Anesthesia: Monitor Anesthesia Care | Site: Eye | Laterality: Left

## 2020-05-16 MED ORDER — LIDOCAINE HCL (PF) 2 % IJ SOLN
INTRAOCULAR | Status: DC | PRN
  Administered 2020-05-16: 2 mL

## 2020-05-16 MED ORDER — NA CHONDROIT SULF-NA HYALURON 40-17 MG/ML IO SOLN
INTRAOCULAR | Status: DC | PRN
  Administered 2020-05-16: 1 mL via INTRAOCULAR

## 2020-05-16 MED ORDER — ONDANSETRON HCL 4 MG/2ML IJ SOLN: 4.0000 mg | Freq: Once | INTRAMUSCULAR | Status: DC | PRN

## 2020-05-16 MED ORDER — FENTANYL CITRATE (PF) 100 MCG/2ML IJ SOLN
INTRAMUSCULAR | Status: DC | PRN
  Administered 2020-05-16: 25 ug via INTRAVENOUS

## 2020-05-16 MED ORDER — BRIMONIDINE TARTRATE-TIMOLOL 0.2-0.5 % OP SOLN
OPHTHALMIC | Status: DC | PRN
  Administered 2020-05-16: 1 [drp] via OPHTHALMIC

## 2020-05-16 MED ORDER — EPINEPHRINE PF 1 MG/ML IJ SOLN
INTRAOCULAR | Status: DC | PRN
  Administered 2020-05-16: 39 mL via OPHTHALMIC

## 2020-05-16 MED ORDER — ARMC OPHTHALMIC DILATING DROPS
1.0000 "application " | OPHTHALMIC | Status: DC | PRN
  Administered 2020-05-16 (×3): 1 via OPHTHALMIC

## 2020-05-16 MED ORDER — ACETAMINOPHEN 325 MG PO TABS: 325.0000 mg | ORAL_TABLET | ORAL | Status: DC | PRN

## 2020-05-16 MED ORDER — TETRACAINE HCL 0.5 % OP SOLN
1.0000 [drp] | OPHTHALMIC | Status: DC | PRN
  Administered 2020-05-16 (×3): 1 [drp] via OPHTHALMIC

## 2020-05-16 MED ORDER — ACETAMINOPHEN 160 MG/5ML PO SOLN: 325.0000 mg | ORAL | Status: DC | PRN

## 2020-05-16 MED ORDER — MOXIFLOXACIN HCL 0.5 % OP SOLN
OPHTHALMIC | Status: DC | PRN
  Administered 2020-05-16: 0.2 mL via OPHTHALMIC

## 2020-05-16 SURGICAL SUPPLY — 20 items
CANNULA ANT/CHMB 27G (MISCELLANEOUS) ×2 IMPLANT
CANNULA ANT/CHMB 27GA (MISCELLANEOUS) ×6 IMPLANT
GLOVE SURG LX 8.0 MICRO (GLOVE) ×2
GLOVE SURG LX STRL 8.0 MICRO (GLOVE) ×1 IMPLANT
GLOVE SURG TRIUMPH 8.0 PF LTX (GLOVE) ×3 IMPLANT
GOWN STRL REUS W/ TWL LRG LVL3 (GOWN DISPOSABLE) ×2 IMPLANT
GOWN STRL REUS W/TWL LRG LVL3 (GOWN DISPOSABLE) ×6
LENS IOL DIOP 21.0 (Intraocular Lens) ×3 IMPLANT
LENS IOL TECNIS MONO 21.0 (Intraocular Lens) IMPLANT
MARKER SKIN DUAL TIP RULER LAB (MISCELLANEOUS) ×3 IMPLANT
NDL FILTER BLUNT 18X1 1/2 (NEEDLE) ×1 IMPLANT
NEEDLE FILTER BLUNT 18X 1/2SAF (NEEDLE) ×2
NEEDLE FILTER BLUNT 18X1 1/2 (NEEDLE) ×1 IMPLANT
PACK EYE AFTER SURG (MISCELLANEOUS) ×3 IMPLANT
PACK OPTHALMIC (MISCELLANEOUS) ×3 IMPLANT
PACK PORFILIO (MISCELLANEOUS) ×3 IMPLANT
SYR 3ML LL SCALE MARK (SYRINGE) ×3 IMPLANT
SYR TB 1ML LUER SLIP (SYRINGE) ×3 IMPLANT
WATER STERILE IRR 250ML POUR (IV SOLUTION) ×3 IMPLANT
WIPE NON LINTING 3.25X3.25 (MISCELLANEOUS) ×3 IMPLANT

## 2020-05-16 NOTE — H&P (Signed)
All labs reviewed. Abnormal studies sent to patients PCP when indicated.  Previous H&P reviewed, patient examined, there are NO CHANGES.  Kevin Lineman Porfilio7/26/20217:22 AM

## 2020-05-16 NOTE — H&P (Signed)
All labs reviewed. Abnormal studies sent to patients PCP when indicated.  Previous H&P reviewed, patient examined, there are NO CHANGES.  Kevin Dugo Porfilio7/26/20217:49 AM

## 2020-05-16 NOTE — Anesthesia Procedure Notes (Signed)
Procedure Name: MAC Date/Time: 05/16/2020 7:56 AM Performed by: [name] Ali, CRNA Pre-anesthesia Checklist: Patient identified, Emergency Drugs available, Suction available, Timeout performed and Patient being monitored Patient Re-evaluated:Patient Re-evaluated prior to induction Oxygen Delivery Method: Nasal cannula Placement Confirmation: positive ETCO2

## 2020-05-16 NOTE — Op Note (Signed)
PREOPERATIVE DIAGNOSIS:  Nuclear sclerotic cataract of the left eye.   POSTOPERATIVE DIAGNOSIS:  Nuclear sclerotic cataract of the left eye.   OPERATIVE PROCEDURE:@   SURGEON:  Birder Robson, MD.   ANESTHESIA:  Anesthesiologist: Veda Canning, MD CRNA: [name] Ali, CRNA  1.      Managed anesthesia care. 2.     0.42ml of Shugarcaine was instilled following the paracentesis   COMPLICATIONS:  None.   TECHNIQUE:   Stop and chop   DESCRIPTION OF PROCEDURE:  The patient was examined and consented in the preoperative holding area where the aforementioned topical anesthesia was applied to the left eye and then brought back to the Operating Room where the left eye was prepped and draped in the usual sterile ophthalmic fashion and a lid speculum was placed. A paracentesis was created with the side port blade and the anterior chamber was filled with viscoelastic. A near clear corneal incision was performed with the steel keratome. A continuous curvilinear capsulorrhexis was performed with a cystotome followed by the capsulorrhexis forceps. Hydrodissection and hydrodelineation were carried out with BSS on a blunt cannula. The lens was removed in a stop and chop  technique and the remaining cortical material was removed with the irrigation-aspiration handpiece. The capsular bag was inflated with viscoelastic and the Technis [id number] lens was placed in the capsular bag without complication. The remaining viscoelastic was removed from the eye with the irrigation-aspiration handpiece. The wounds were hydrated. The anterior chamber was flushed with BSS and the eye was inflated to physiologic pressure. 0.46ml Vigamox was placed in the anterior chamber. The wounds were found to be water tight. The eye was dressed with Combigan. The patient was given protective glasses to wear throughout the day and a shield with which to sleep tonight. The patient was also given drops with which to begin a drop regimen today and  will follow-up with me in one day. Implant Name Type Inv. Item Serial No. Manufacturer Lot No. LRB No. Used Action  LENS IOL DIOP 21.0 - [id number] Intraocular Lens LENS IOL DIOP 21.0 [id number] [employer]  Left 1 Implanted    Procedure(s): CATARACT EXTRACTION PHACO AND INTRAOCULAR LENS PLACEMENT (IOC) LEFT 7.27 00:39.0  (Left)  Electronically signed: Birder Robson 05/16/2020 8:13 AM

## 2020-05-16 NOTE — Anesthesia Postprocedure Evaluation (Signed)
Anesthesia Post Note  Patient: Kevin [name]  Procedure(s) Performed: CATARACT EXTRACTION PHACO AND INTRAOCULAR LENS PLACEMENT (IOC) LEFT 7.27 00:39.0  (Left Eye)     Patient location during evaluation: PACU Anesthesia Type: MAC Level of consciousness: awake Pain management: pain level controlled Vital Signs Assessment: post-procedure vital signs reviewed and stable Respiratory status: respiratory function stable Cardiovascular status: stable Postop Assessment: no apparent nausea or vomiting Anesthetic complications: no   No complications documented.  Veda Canning

## 2020-05-16 NOTE — Anesthesia Preprocedure Evaluation (Signed)
Anesthesia Evaluation  Patient identified by MRN, date of birth, ID band Patient awake    Reviewed: Allergy & Precautions, NPO status   Airway Mallampati: II  TM Distance: >3 FB     Dental   Pulmonary asthma ,    breath sounds clear to auscultation       Cardiovascular hypertension,  Rhythm:Regular Rate:Normal     Neuro/Psych Depression Dementia Parkinson's    GI/Hepatic GERD  ,  Endo/Other    Renal/GU      Musculoskeletal Osteoporosis   Abdominal   Peds  Hematology  (+) anemia ,   Anesthesia Other Findings   Reproductive/Obstetrics                             Anesthesia Physical Anesthesia Plan  ASA: III  Anesthesia Plan: MAC   Post-op Pain Management:    Induction: Intravenous  PONV Risk Score and Plan: TIVA and Treatment may vary due to age or medical condition  Airway Management Planned: Natural Airway and Nasal Cannula  Additional Equipment:   Intra-op Plan:   Post-operative Plan:   Informed Consent: I have reviewed the patients History and Physical, chart, labs and discussed the procedure including the risks, benefits and alternatives for the proposed anesthesia with the patient or authorized representative who has indicated his/her understanding and acceptance.       Plan Discussed with: CRNA  Anesthesia Plan Comments:         Anesthesia Quick Evaluation

## 2020-05-16 NOTE — Transfer of Care (Signed)
Immediate Anesthesia Transfer of Care Note  Patient: Kevin [name]  Procedure(s) Performed: CATARACT EXTRACTION PHACO AND INTRAOCULAR LENS PLACEMENT (IOC) LEFT 7.27 00:39.0  (Left Eye)  Patient Location: PACU  Anesthesia Type: MAC  Level of Consciousness: awake, alert  and patient cooperative  Airway and Oxygen Therapy: Patient Spontanous Breathing and Patient connected to supplemental oxygen  Post-op Assessment: Post-op Vital signs reviewed, Patient's Cardiovascular Status Stable, Respiratory Function Stable, Patent Airway and No signs of Nausea or vomiting  Post-op Vital Signs: Reviewed and stable  Complications: No complications documented.

## 2020-05-17 ENCOUNTER — Encounter: Payer: Self-pay | Admitting: Ophthalmology

## 2020-05-30 ENCOUNTER — Ambulatory Visit: Payer: Medicare HMO

## 2020-05-31 ENCOUNTER — Other Ambulatory Visit: Payer: Self-pay

## 2020-05-31 ENCOUNTER — Ambulatory Visit
Admission: RE | Admit: 2020-05-31 | Discharge: 2020-05-31 | Disposition: A | Discharge status: Home / Self Care | Payer: Medicare HMO | Source: Ambulatory Visit | Attending: Urology | Admitting: Urology

## 2020-05-31 DIAGNOSIS — K769 Liver disease, unspecified: Secondary | ICD-10-CM | POA: Insufficient documentation

## 2020-05-31 MED ORDER — GADOBUTROL 1 MMOL/ML IV SOLN
6.0000 mL | Freq: Once | INTRAVENOUS | Status: AC | PRN
  Administered 2020-05-31: 6 mL via INTRAVENOUS

## 2020-06-01 ENCOUNTER — Encounter: Payer: Self-pay | Admitting: Ophthalmology

## 2020-06-01 ENCOUNTER — Other Ambulatory Visit: Payer: Self-pay

## 2020-06-02 ENCOUNTER — Ambulatory Visit
Admission: RE | Admit: 2020-06-02 | Discharge: 2020-06-02 | Disposition: A | Discharge status: Home / Self Care | Payer: Medicare HMO | Source: Ambulatory Visit | Attending: Urology | Admitting: Urology

## 2020-06-02 ENCOUNTER — Other Ambulatory Visit: Payer: Self-pay

## 2020-06-02 DIAGNOSIS — C641 Malignant neoplasm of right kidney, except renal pelvis: Secondary | ICD-10-CM | POA: Diagnosis present

## 2020-06-02 MED ORDER — IOHEXOL 350 MG/ML SOLN: 75.0000 mL | Freq: Once | INTRAVENOUS | Status: DC | PRN

## 2020-06-02 MED ORDER — IOHEXOL 300 MG/ML  SOLN
100.0000 mL | Freq: Once | INTRAMUSCULAR | Status: AC | PRN
  Administered 2020-06-02: 100 mL via INTRAVENOUS

## 2020-06-03 ENCOUNTER — Other Ambulatory Visit
Admission: RE | Admit: 2020-06-03 | Discharge: 2020-06-03 | Disposition: A | Discharge status: Home / Self Care | Payer: Medicare HMO | Source: Ambulatory Visit | Attending: Pediatric Dentistry | Admitting: Pediatric Dentistry

## 2020-06-03 DIAGNOSIS — Z20822 Contact with and (suspected) exposure to covid-19: Secondary | ICD-10-CM | POA: Diagnosis not present

## 2020-06-03 DIAGNOSIS — Z01812 Encounter for preprocedural laboratory examination: Secondary | ICD-10-CM | POA: Insufficient documentation

## 2020-06-03 NOTE — Discharge Instructions (Signed)

## 2020-06-04 LAB — SARS CORONAVIRUS 2 (TAT 6-24 HRS): SARS Coronavirus 2: NEGATIVE

## 2020-06-07 ENCOUNTER — Ambulatory Visit: Payer: Medicare HMO | Admitting: Anesthesiology

## 2020-06-07 ENCOUNTER — Encounter
Admission: RE | Disposition: A | Discharge status: Home / Self Care | Payer: Self-pay | Source: Home / Self Care | Attending: Ophthalmology

## 2020-06-07 ENCOUNTER — Ambulatory Visit
Admission: RE | Admit: 2020-06-07 | Discharge: 2020-06-07 | Disposition: A | Discharge status: Home / Self Care | Payer: Medicare HMO | Attending: Ophthalmology | Admitting: Ophthalmology

## 2020-06-07 ENCOUNTER — Other Ambulatory Visit: Payer: Self-pay

## 2020-06-07 ENCOUNTER — Encounter: Payer: Self-pay | Admitting: Ophthalmology

## 2020-06-07 DIAGNOSIS — Z85528 Personal history of other malignant neoplasm of kidney: Secondary | ICD-10-CM | POA: Diagnosis not present

## 2020-06-07 DIAGNOSIS — H2511 Age-related nuclear cataract, right eye: Secondary | ICD-10-CM | POA: Diagnosis not present

## 2020-06-07 DIAGNOSIS — E7800 Pure hypercholesterolemia, unspecified: Secondary | ICD-10-CM | POA: Insufficient documentation

## 2020-06-07 DIAGNOSIS — Z79899 Other long term (current) drug therapy: Secondary | ICD-10-CM | POA: Insufficient documentation

## 2020-06-07 DIAGNOSIS — Z9842 Cataract extraction status, left eye: Secondary | ICD-10-CM | POA: Diagnosis not present

## 2020-06-07 DIAGNOSIS — Z905 Acquired absence of kidney: Secondary | ICD-10-CM | POA: Diagnosis not present

## 2020-06-07 DIAGNOSIS — G20 Parkinson's disease: Secondary | ICD-10-CM | POA: Diagnosis not present

## 2020-06-07 DIAGNOSIS — J45909 Unspecified asthma, uncomplicated: Secondary | ICD-10-CM | POA: Diagnosis not present

## 2020-06-07 DIAGNOSIS — F0280 Dementia in other diseases classified elsewhere without behavioral disturbance: Secondary | ICD-10-CM | POA: Diagnosis not present

## 2020-06-07 DIAGNOSIS — I10 Essential (primary) hypertension: Secondary | ICD-10-CM | POA: Diagnosis not present

## 2020-06-07 DIAGNOSIS — M810 Age-related osteoporosis without current pathological fracture: Secondary | ICD-10-CM | POA: Insufficient documentation

## 2020-06-07 HISTORY — PX: CATARACT EXTRACTION W/PHACO: SHX586

## 2020-06-07 SURGERY — PHACOEMULSIFICATION, CATARACT, WITH IOL INSERTION
Anesthesia: Monitor Anesthesia Care | Site: Eye | Laterality: Right

## 2020-06-07 MED ORDER — ARMC OPHTHALMIC DILATING DROPS
1.0000 "application " | OPHTHALMIC | Status: DC | PRN
  Administered 2020-06-07 (×3): 1 via OPHTHALMIC

## 2020-06-07 MED ORDER — ACETAMINOPHEN 160 MG/5ML PO SOLN: 325.0000 mg | ORAL | Status: DC | PRN

## 2020-06-07 MED ORDER — NA CHONDROIT SULF-NA HYALURON 40-17 MG/ML IO SOLN
INTRAOCULAR | Status: DC | PRN
  Administered 2020-06-07: 1 mL via INTRAOCULAR

## 2020-06-07 MED ORDER — MOXIFLOXACIN HCL 0.5 % OP SOLN
OPHTHALMIC | Status: DC | PRN
  Administered 2020-06-07: 0.2 mL via OPHTHALMIC

## 2020-06-07 MED ORDER — BRIMONIDINE TARTRATE-TIMOLOL 0.2-0.5 % OP SOLN
OPHTHALMIC | Status: DC | PRN
  Administered 2020-06-07: 1 [drp] via OPHTHALMIC

## 2020-06-07 MED ORDER — ONDANSETRON HCL 4 MG/2ML IJ SOLN: 4.0000 mg | Freq: Once | INTRAMUSCULAR | Status: DC | PRN

## 2020-06-07 MED ORDER — LIDOCAINE HCL (PF) 2 % IJ SOLN
INTRAOCULAR | Status: DC | PRN
  Administered 2020-06-07: 2 mL

## 2020-06-07 MED ORDER — FENTANYL CITRATE (PF) 100 MCG/2ML IJ SOLN
INTRAMUSCULAR | Status: DC | PRN
  Administered 2020-06-07: 50 ug via INTRAVENOUS

## 2020-06-07 MED ORDER — ACETAMINOPHEN 325 MG PO TABS: 325.0000 mg | ORAL_TABLET | ORAL | Status: DC | PRN

## 2020-06-07 MED ORDER — EPINEPHRINE PF 1 MG/ML IJ SOLN
INTRAOCULAR | Status: DC | PRN
  Administered 2020-06-07: 56 mL via OPHTHALMIC

## 2020-06-07 MED ORDER — TETRACAINE HCL 0.5 % OP SOLN
1.0000 [drp] | OPHTHALMIC | Status: DC | PRN
  Administered 2020-06-07 (×3): 1 [drp] via OPHTHALMIC

## 2020-06-07 MED ORDER — LACTATED RINGERS IV SOLN: INTRAVENOUS | Status: DC

## 2020-06-07 SURGICAL SUPPLY — 20 items
CANNULA ANT/CHMB 27G (MISCELLANEOUS) ×2 IMPLANT
CANNULA ANT/CHMB 27GA (MISCELLANEOUS) ×6 IMPLANT
GLOVE SURG LX 8.0 MICRO (GLOVE) ×2
GLOVE SURG LX STRL 8.0 MICRO (GLOVE) ×1 IMPLANT
GLOVE SURG TRIUMPH 8.0 PF LTX (GLOVE) ×3 IMPLANT
GOWN STRL REUS W/ TWL LRG LVL3 (GOWN DISPOSABLE) ×2 IMPLANT
GOWN STRL REUS W/TWL LRG LVL3 (GOWN DISPOSABLE) ×6
LENS IOL DIOP 21.5 (Intraocular Lens) ×3 IMPLANT
LENS IOL TECNIS MONO 21.5 (Intraocular Lens) IMPLANT
MARKER SKIN DUAL TIP RULER LAB (MISCELLANEOUS) ×3 IMPLANT
NDL FILTER BLUNT 18X1 1/2 (NEEDLE) ×1 IMPLANT
NEEDLE FILTER BLUNT 18X 1/2SAF (NEEDLE) ×2
NEEDLE FILTER BLUNT 18X1 1/2 (NEEDLE) ×1 IMPLANT
PACK EYE AFTER SURG (MISCELLANEOUS) ×3 IMPLANT
PACK OPTHALMIC (MISCELLANEOUS) ×3 IMPLANT
PACK PORFILIO (MISCELLANEOUS) ×3 IMPLANT
SYR 3ML LL SCALE MARK (SYRINGE) ×3 IMPLANT
SYR TB 1ML LUER SLIP (SYRINGE) ×3 IMPLANT
WATER STERILE IRR 250ML POUR (IV SOLUTION) ×3 IMPLANT
WIPE NON LINTING 3.25X3.25 (MISCELLANEOUS) ×3 IMPLANT

## 2020-06-07 NOTE — Anesthesia Preprocedure Evaluation (Signed)
Anesthesia Evaluation  Patient identified by MRN, date of birth, ID band Patient awake    Reviewed: Allergy & Precautions, NPO status   Airway Mallampati: II  TM Distance: >3 FB     Dental no notable dental hx.    Pulmonary asthma , pneumonia,    breath sounds clear to auscultation       Cardiovascular Exercise Tolerance: Good hypertension,  Rhythm:Regular Rate:Normal     Neuro/Psych PSYCHIATRIC DISORDERS Depression Dementia Parkinson's    GI/Hepatic GERD  ,  Endo/Other    Renal/GU      Musculoskeletal Osteoporosis   Abdominal Normal abdominal exam  (+) - obese,   Peds  Hematology  (+) Blood dyscrasia, anemia ,   Anesthesia Other Findings   Reproductive/Obstetrics                             Anesthesia Physical  Anesthesia Plan  ASA: III  Anesthesia Plan: MAC   Post-op Pain Management:    Induction: Intravenous  PONV Risk Score and Plan: 1 and TIVA and Treatment may vary due to age or medical condition  Airway Management Planned: Natural Airway and Nasal Cannula  Additional Equipment:   Intra-op Plan:   Post-operative Plan:   Informed Consent: I have reviewed the patients History and Physical, chart, labs and discussed the procedure including the risks, benefits and alternatives for the proposed anesthesia with the patient or authorized representative who has indicated his/her understanding and acceptance.       Plan Discussed with: CRNA  Anesthesia Plan Comments:         Anesthesia Quick Evaluation  Patient Active Problem List   Diagnosis Date Noted  . CAP (community acquired pneumonia) 03/04/2019  . Right renal mass 11/17/2018    CBC Latest Ref Rng & Units 12/01/2019 03/05/2019 03/04/2019  WBC 4.0 - 10.5 K/uL 4.5 6.6 5.2  Hemoglobin 13.0 - 17.0 g/dL 12.2(L) 11.8(L) 10.9(L)  Hematocrit 39 - 52 % 35.6(L) 33.8(L) 30.5(L)  Platelets 150 - 400 K/uL 230 121(L)  117(L)   BMP Latest Ref Rng & Units 12/01/2019 06/16/2019 03/06/2019  Glucose 70 - 99 mg/dL 116(H) - -  BUN 8 - 23 mg/dL 25(H) - -  Creatinine 0.61 - 1.24 mg/dL 1.42(H) 1.10 -  BUN/Creat Ratio 10 - 24 - - -  Sodium 135 - 145 mmol/L 129(L) - -  Potassium 3.5 - 5.1 mmol/L 3.7 - 3.7  Chloride 98 - 111 mmol/L 93(L) - -  CO2 22 - 32 mmol/L 27 - -  Calcium 8.9 - 10.3 mg/dL 9.2 - -    Risks and benefits of anesthesia discussed at length, patient or surrogate demonstrates understanding. Appropriately NPO. Plan to proceed with anesthesia.  Champ Mungo, MD 06/07/20

## 2020-06-07 NOTE — H&P (Signed)
All labs reviewed. Abnormal studies sent to patients PCP when indicated.  Previous H&P reviewed, patient examined, there are NO CHANGES.  Kevin Colantonio Porfilio8/17/202111:04 AM

## 2020-06-07 NOTE — Anesthesia Postprocedure Evaluation (Signed)
Anesthesia Post Note  Patient: Kevin [name]  Procedure(s) Performed: CATARACT EXTRACTION PHACO AND INTRAOCULAR LENS PLACEMENT (IOC) RIGHT 11.56 00:54.9 (Right Eye)     Patient location during evaluation: PACU Anesthesia Type: MAC Level of consciousness: awake and alert Pain management: pain level controlled Vital Signs Assessment: post-procedure vital signs reviewed and stable Respiratory status: spontaneous breathing, nonlabored ventilation, respiratory function stable and patient connected to nasal cannula oxygen Cardiovascular status: stable and blood pressure returned to baseline Postop Assessment: no apparent nausea or vomiting Anesthetic complications: no   No complications documented.  Sinda Du

## 2020-06-07 NOTE — Anesthesia Procedure Notes (Signed)
Procedure Name: MAC Date/Time: 06/07/2020 11:19 AM Performed by: Silvana Newness, CRNA Pre-anesthesia Checklist: Patient identified, Emergency Drugs available, Suction available, Patient being monitored and Timeout performed Patient Re-evaluated:Patient Re-evaluated prior to induction Oxygen Delivery Method: Nasal cannula Placement Confirmation: positive ETCO2

## 2020-06-07 NOTE — Transfer of Care (Signed)
Immediate Anesthesia Transfer of Care Note  Patient: Kevin [name]  Procedure(s) Performed: CATARACT EXTRACTION PHACO AND INTRAOCULAR LENS PLACEMENT (IOC) RIGHT 11.56 00:54.9 (Right Eye)  Patient Location: PACU  Anesthesia Type: MAC  Level of Consciousness: awake, alert  and patient cooperative  Airway and Oxygen Therapy: Patient Spontanous Breathing and Patient connected to supplemental oxygen  Post-op Assessment: Post-op Vital signs reviewed, Patient's Cardiovascular Status Stable, Respiratory Function Stable, Patent Airway and No signs of Nausea or vomiting  Post-op Vital Signs: Reviewed and stable  Complications: No complications documented.

## 2020-06-07 NOTE — Op Note (Signed)
PREOPERATIVE DIAGNOSIS:  Nuclear sclerotic cataract of the right eye.   POSTOPERATIVE DIAGNOSIS:  H25.11 Cataract   OPERATIVE PROCEDURE:@   SURGEON:  Birder Robson, MD.   ANESTHESIA:  Anesthesiologist: Sinda Du, MD CRNA: Silvana Newness, CRNA  1.      Managed anesthesia care. 2.      0.23ml of Shugarcaine was instilled in the eye following the paracentesis.   COMPLICATIONS:  None.   TECHNIQUE:   Stop and chop   DESCRIPTION OF PROCEDURE:  The patient was examined and consented in the preoperative holding area where the aforementioned topical anesthesia was applied to the right eye and then brought back to the Operating Room where the right eye was prepped and draped in the usual sterile ophthalmic fashion and a lid speculum was placed. A paracentesis was created with the side port blade and the anterior chamber was filled with viscoelastic. A near clear corneal incision was performed with the steel keratome. A continuous curvilinear capsulorrhexis was performed with a cystotome followed by the capsulorrhexis forceps. Hydrodissection and hydrodelineation were carried out with BSS on a blunt cannula. The lens was removed in a stop and chop  technique and the remaining cortical material was removed with the irrigation-aspiration handpiece. The capsular bag was inflated with viscoelastic and the Technis [id number]  lens was placed in the capsular bag without complication. The remaining viscoelastic was removed from the eye with the irrigation-aspiration handpiece. The wounds were hydrated. The anterior chamber was flushed with BSS and the eye was inflated to physiologic pressure. 0.63ml of Vigamox was placed in the anterior chamber. The wounds were found to be water tight. The eye was dressed with Combigan. The patient was given protective glasses to wear throughout the day and a shield with which to sleep tonight. The patient was also given drops with which to begin a drop regimen today and will  follow-up with me in one day. Implant Name Type Inv. Item Serial No. Manufacturer Lot No. LRB No. Used Action  LENS IOL DIOP 21.5 - [id number] Intraocular Lens LENS IOL DIOP 21.5 [id number] [employer]  Right 1 Implanted   Procedure(s): CATARACT EXTRACTION PHACO AND INTRAOCULAR LENS PLACEMENT (IOC) RIGHT 11.56 00:54.9 (Right)  Electronically signed: Birder Robson 06/07/2020 11:32 AM

## 2020-06-08 ENCOUNTER — Encounter: Payer: Self-pay | Admitting: Ophthalmology

## 2020-06-13 NOTE — Progress Notes (Signed)
06/14/2020 5:22 PM   [name] [birth year] [id number]  Referring provider: Baxter Hire, MD [place],  [place] [address] Chief Complaint  Patient presents with  . RCC    1year follow up    HPI: Kevin [name] is a 86 y.o. male who returns for a 1 year follow up of right renal cell carcinoma and liver lesion. He is accompanied by his wife.   On 11/17/2018 heunderwent robotic assisted laparoscopic nephrectomy.  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  It shows no evidence of local recurrence or metastatic disease.  He does have an incidental finding in his liver, described as a 1.4 x 1 cm hypervascular lesion noted on the arterial phase likely representing a flash fill hemangioma.  Notably, the patient reports that he was evaluated for this about 20 years ago at [facility].  He even underwent liver biopsy which showed what he said was a "blood vessel issue".  He is not had subsequent follow-up of this.  CT of the chest in the form of CT angios to rule out PE on 03/04/2019.  This showed no suspicious lung lesions.  Recent creatinine was 1.0 on 05/18/20.  Liver MRI on 05/31/2020 showed while the exam is somewhat limited by patient respiratory motion on the postcontrast T1 weighted imaging, lesion in the lateral segment of LEFT hepatic lobe has imaging characteristics most consistent benign hemangioma. Second larger benign hemangioma in the subcapsular RIGHT hepatic lobe. Post RIGHT nephrectomy. Bosniak 1 and Bosniak 2 renal cysts of the LEFT kidney. No enhancing LEFT renal lesion  CT A/P w/ contrast on 06/02/2020 showed previous right nephrectomy. No evidence of recurrent or metastatic carcinoma within the abdomen or pelvis. Colonic diverticulosis, without radiographic evidence of diverticulitis. Stable liver  lesions, better characterized as benign hemangiomas on recent MRI. Moderately enlarged prostate.  CXR on 06/02/2020 showed no active disease. No sign of metastatic disease. Chronic aortic atherosclerosis.  The patient had no complaints today.   PMH: Past Medical History:  Diagnosis Date  . Anemia    in past  . Asthma    none in years  . Broken arm    Pt was 19  . 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 Cataract cortical, senile   . Colon polyps   . Dementia ([place])    Parkinson's, loss of short term memory  . E coli infection 2020   blood stream  . GERD (gastroesophageal reflux disease)   . HOH (hard of hearing)    extremely HOH, wears aids  . Hyperlipidemia   . Hypertension   . Osteoporosis   . Pneumonia    AS A CHILD    Surgical History: Past Surgical History:  Procedure Laterality Date  . BACK SURGERY    . CATARACT EXTRACTION W/PHACO Left 05/16/2020   Procedure: CATARACT EXTRACTION PHACO AND INTRAOCULAR LENS PLACEMENT (IOC) LEFT 7.27 00:39.0 ;  Surgeon: Birder Robson, MD;  Location: [facility];  Service: Ophthalmology;  Laterality: Left;  . CATARACT EXTRACTION W/PHACO Right 06/07/2020   Procedure: CATARACT EXTRACTION PHACO AND INTRAOCULAR LENS PLACEMENT (IOC) RIGHT 11.56 00:54.9;  Surgeon: Birder Robson, MD;  Location: [facility];  Service: Ophthalmology;  Laterality: Right;  . COLONOSCOPY  2001, 2006, 2012  . ESOPHAGOGASTRODUODENOSCOPY  09/11/2000  . FLEXIBLE SIGMOIDOSCOPY  03/02/1997  .  KYPHOPLASTY N/A 11/13/2019   Procedure: T12 KYPHOPLASTY;  Surgeon: Hessie Knows, MD;  Location: [facility];  Service: Orthopedics;  Laterality: N/A;  . KYPHOPLASTY N/A 12/03/2019   Procedure: L1 KYPHOPLASTY;  Surgeon: Hessie Knows, MD;  Location: [facility];  Service: Orthopedics;  Laterality: N/A;  . NASAL POLYP EXCISION    . NEPHRECTOMY    . ROBOT ASSISTED LAPAROSCOPIC NEPHRECTOMY Right 11/17/2018   Procedure: ROBOTIC ASSISTED  LAPAROSCOPIC NEPHRECTOMY;  Surgeon: Hollice Espy, MD;  Location: [facility];  Service: Urology;  Laterality: Right;    Home Medications:  Allergies as of 06/14/2020   No Known Allergies     Medication List       Accurate as of June 14, 2020 11:59 PM. If you have any questions, ask your nurse or doctor.        STOP taking these medications   oxyCODONE-acetaminophen 5-325 MG tablet Commonly known as: PERCOCET/ROXICET Stopped by: Hollice Espy, MD   Proventil HFA 108 (90 Base) MCG/ACT inhaler Generic drug: albuterol Stopped by: Hollice Espy, MD     TAKE these medications   acetaminophen 325 MG tablet Commonly known as: TYLENOL Take 650 mg by mouth every 6 (six) hours as needed.   ALENDRONATE SODIUM PO Take by mouth once a week.   amLODipine 5 MG tablet Commonly known as: NORVASC Take 5 mg by mouth daily.   CALCIUM 600 + D PO Take by mouth.   carbidopa-levodopa 25-100 MG tablet Commonly known as: SINEMET IR Take 1 tablet by mouth 4 (four) times daily.   escitalopram 10 MG tablet Commonly known as: LEXAPRO Take 10 mg by mouth daily.   nystatin cream Commonly known as: MYCOSTATIN Apply 1 application topically 2 (two) times daily.   ondansetron 4 MG disintegrating tablet Commonly known as: ZOFRAN-ODT Take 4 mg by mouth every 8 (eight) hours as needed for nausea or vomiting.   PRESERVISION AREDS 2 PO Take 1 tablet by mouth daily.   psyllium 0.52 g capsule Commonly known as: REGULOID Take 0.52 g by mouth daily at 12 noon.   rivastigmine 3 MG capsule Commonly known as: EXELON Take 3 mg by mouth 2 (two) times daily.   SENOKOT PO Take by mouth as needed.   simvastatin 10 MG tablet Commonly known as: ZOCOR Take 10 mg by mouth every evening.       Allergies: No Known Allergies  Family History: Family History  Problem Relation Age of Onset  . Heart disease Mother   . Pancreatitis Mother   . Emphysema Father   . Breast cancer Sister   .  Breast cancer Sister     Social History:  reports that he has never smoked. He has never used smokeless tobacco. He reports that he does not drink alcohol and does not use drugs.   Physical Exam: BP 127/78   Pulse 90   Ht 5\' 10"  (1.778 m)   Wt 155 lb (70.3 kg)   BMI 22.24 kg/m   Constitutional:  Alert and oriented, No acute distress. HEENT: [place] AT, moist mucus membranes.  Trachea midline, no masses. Cardiovascular: No clubbing, cyanosis, or edema. Respiratory: Normal respiratory effort, no increased work of breathing. Skin: No rashes, bruises or suspicious lesions. Neurologic: Grossly intact, no focal deficits, moving all 4 extremities. Psychiatric: Normal mood and affect.  Laboratory Data:  Lab Results  Component Value Date   CREATININE 1.42 (H) 12/01/2019     Pertinent Imaging: CLINICAL DATA:  Evaluate liver lesion. Indeterminate lesion within liver on prior  CT exam.  EXAM: MRI ABDOMEN WITHOUT AND WITH CONTRAST  TECHNIQUE: Multiplanar multisequence MR imaging of the abdomen was performed both before and after the administration of intravenous contrast.  CONTRAST:  42mL GADAVIST GADOBUTROL 1 MMOL/ML IV SOLN  COMPARISON:  CT 06/16/2019  FINDINGS: Lower chest: Lung bases are clear.  Hepatobiliary: In the lateral segment of the LEFT hepatic lobe there is a rounded lesion measuring 8 mm (image 11/3) which is hyperintense on T2 weighted imaging and corresponds to the hyperenhancing lesion on comparison CT. There is motion degradation of the postcontrast T1 weighted imaging therefore the early enhancement pattern cannot be determined. On the most delayed imaging (3 minutes), there is a delayed enhancement (image 27/18).  While evaluation limited due to patient motion, combination MRI and CT findings favor benign hemangioma.  Within the posterior aspect of the RIGHT hepatic lobe a T2 hyperintense lesion measuring 19 mm on image 14/3 has  characteristic peripheral enhancement typical of a hemangioma.  Pancreas: Pancreas is normal. No ductal dilatation. No pancreatic inflammation.  Spleen: Normal spleen  Adrenals/urinary tract: Adrenal glands normal. Post RIGHT nephrectomy.  Benign nonenhancing cyst in the LEFT kidney measures 3.3 cm. Exophytic 1.6 cm lesion from the mid LEFT kidney is hyperintense on noncontrast T1 weighted imaging and demonstrates no post-contrast enhancement consistent with a benign hemorrhagic cyst (image 63/10).  Stomach/Bowel: Stomach, small bowel, appendix, and cecum are normal. The colon and rectosigmoid colon are normal.  Vascular/Lymphatic: Abdominal aorta is normal caliber. No upper abdominal adenopathy.  Other: No free fluid.  Musculoskeletal: No aggressive osseous lesion.  IMPRESSION: 1. While the exam is somewhat limited by patient respiratory motion on the postcontrast T1 weighted imaging, lesion in the lateral segment of LEFT hepatic lobe has imaging characteristics most consistent benign hemangioma. Second larger benign hemangioma in the subcapsular RIGHT hepatic lobe. 2. Post RIGHT nephrectomy. 3. Bosniak 1 and Bosniak 2 renal cysts of the LEFT kidney. No enhancing LEFT renal lesion   Electronically Signed   By: Suzy Bouchard M.D.   On: 05/31/2020 10:12  CLINICAL DATA:  Follow-up right renal cell carcinoma. Status post radical nephrectomy.  EXAM: CT ABDOMEN AND PELVIS WITH CONTRAST  TECHNIQUE: Multidetector CT imaging of the abdomen and pelvis was performed using the standard protocol following bolus administration of intravenous contrast.  CONTRAST:  163mL OMNIPAQUE IOHEXOL 300 MG/ML  SOLN  COMPARISON:  CT on 06/16/2019 and MRI on 05/31/2020  FINDINGS: Lower Chest: No acute findings.  Hepatobiliary: Small liver lesion in the posterior right hepatic lobe measuring 2.1 cm remains stable, and was shown to represent a hemangioma on recent  MRI. No new or enlarging liver lesions identified. Gallbladder is unremarkable. No evidence of biliary ductal dilatation.  Pancreas:  No mass or inflammatory changes.  Spleen: Within normal limits in size and appearance.  Adrenals/Urinary Tract: Stable postop changes from previous right nephrectomy. Stable benign Bosniak category 1 and 2 cysts in the left kidney. No evidence of left renal mass or hydronephrosis. No evidence of ureteral calculi. Unremarkable unopacified urinary bladder.  Stomach/Bowel: Giant proximal duodenal diverticulum is stable. Diverticulosis is seen predominately involving the descending colon, however there is no evidence of diverticulitis. No evidence of obstruction, inflammatory process or abnormal fluid collections.  Vascular/Lymphatic: No pathologically enlarged lymph nodes. No abdominal aortic aneurysm. Aortic atherosclerosis noted.  Reproductive:  Moderately enlarged prostate.  Other:  None.  Musculoskeletal: No suspicious bone lesions identified. Previous vertebroplasties noted at T12 and L1.  IMPRESSION: Previous right nephrectomy. No evidence of  recurrent or metastatic carcinoma within the abdomen or pelvis.  Colonic diverticulosis, without radiographic evidence of diverticulitis.  Stable liver lesions, better characterized as benign hemangiomas on recent MRI.  Moderately enlarged prostate.  Aortic Atherosclerosis (ICD10-I70.0).   Electronically Signed   By: Marlaine Hind M.D.   On: 06/02/2020 10:48  CLINICAL DATA:  History of renal cell carcinoma.  Follow-up.  EXAM: CHEST  1 VIEW  COMPARISON:  03/04/2019  FINDINGS: Heart size is normal. Chronic aortic atherosclerosis. The lungs are clear. No sign of metastatic disease. No effusions. Old healed rib fractures on the left.  IMPRESSION: No active disease. No sign of metastatic disease. Chronic aortic atherosclerosis.   Electronically Signed   By: Nelson Chimes M.D.   On: 06/02/2020 16:26   I have personally reviewed the images and agree with radiologist interpretation.    Assessment & Plan:    1. Carcinoma, renal cell, right ([place]) No evidence of metastatic or recurrent disease Per NCCN guidelines, will continue annual surveillance imaging for a total of 3 years He is agreeable this plan Continue solitary kidney precautions RTC in 1 year for CXR and CT A/P w/ constrast.  2. Liver lesion  Benign liver MRI. Continue annual follow up with imaging.   [facility] [address] [place], [place] [address] [phone]  I, Selena Batten, am acting as a scribe for Dr. Hollice Espy.  I have reviewed the above documentation for accuracy and completeness, and I agree with the above.   Hollice Espy, MD

## 2020-06-14 ENCOUNTER — Ambulatory Visit: Payer: Medicare HMO | Admitting: Urology

## 2020-06-14 ENCOUNTER — Encounter: Payer: Self-pay | Admitting: Urology

## 2020-06-14 ENCOUNTER — Other Ambulatory Visit: Payer: Self-pay

## 2020-06-14 VITALS — BP 127/78 | HR 90 | Ht 70.0 in | Wt 155.0 lb

## 2020-06-14 DIAGNOSIS — C641 Malignant neoplasm of right kidney, except renal pelvis: Secondary | ICD-10-CM | POA: Diagnosis not present

## 2020-06-15 ENCOUNTER — Ambulatory Visit: Payer: Medicare HMO | Admitting: Urology

## 2020-09-27 ENCOUNTER — Telehealth: Payer: Self-pay | Admitting: Oncology

## 2020-10-03 NOTE — Telephone Encounter (Signed)
Done.. Pts wife called the office back on 10/03/20 and per her request Due to having other appts scheduled pt 1st avail date to RTC will be on 10/17/20.

## 2020-10-17 ENCOUNTER — Inpatient Hospital Stay: Payer: Medicare HMO

## 2020-10-17 ENCOUNTER — Inpatient Hospital Stay: Payer: Medicare HMO | Attending: Oncology | Admitting: Oncology

## 2020-10-17 ENCOUNTER — Encounter: Payer: Self-pay | Admitting: Oncology

## 2020-10-17 VITALS — BP 143/87 | HR 84 | Temp 97.8°F | Wt 156.8 lb

## 2020-10-17 DIAGNOSIS — K769 Liver disease, unspecified: Secondary | ICD-10-CM | POA: Insufficient documentation

## 2020-10-17 DIAGNOSIS — R634 Abnormal weight loss: Secondary | ICD-10-CM | POA: Insufficient documentation

## 2020-10-17 DIAGNOSIS — D696 Thrombocytopenia, unspecified: Secondary | ICD-10-CM | POA: Insufficient documentation

## 2020-10-17 DIAGNOSIS — Z86010 Personal history of colonic polyps: Secondary | ICD-10-CM | POA: Diagnosis not present

## 2020-10-17 DIAGNOSIS — J45909 Unspecified asthma, uncomplicated: Secondary | ICD-10-CM | POA: Diagnosis not present

## 2020-10-17 DIAGNOSIS — R1030 Lower abdominal pain, unspecified: Secondary | ICD-10-CM | POA: Insufficient documentation

## 2020-10-17 DIAGNOSIS — Z79899 Other long term (current) drug therapy: Secondary | ICD-10-CM | POA: Insufficient documentation

## 2020-10-17 DIAGNOSIS — F0280 Dementia in other diseases classified elsewhere without behavioral disturbance: Secondary | ICD-10-CM | POA: Diagnosis not present

## 2020-10-17 DIAGNOSIS — Z803 Family history of malignant neoplasm of breast: Secondary | ICD-10-CM | POA: Insufficient documentation

## 2020-10-17 DIAGNOSIS — D649 Anemia, unspecified: Secondary | ICD-10-CM

## 2020-10-17 DIAGNOSIS — E785 Hyperlipidemia, unspecified: Secondary | ICD-10-CM | POA: Diagnosis not present

## 2020-10-17 DIAGNOSIS — I10 Essential (primary) hypertension: Secondary | ICD-10-CM | POA: Diagnosis not present

## 2020-10-17 DIAGNOSIS — G309 Alzheimer's disease, unspecified: Secondary | ICD-10-CM | POA: Insufficient documentation

## 2020-10-17 DIAGNOSIS — D72819 Decreased white blood cell count, unspecified: Secondary | ICD-10-CM

## 2020-10-17 DIAGNOSIS — K219 Gastro-esophageal reflux disease without esophagitis: Secondary | ICD-10-CM | POA: Insufficient documentation

## 2020-10-17 DIAGNOSIS — M810 Age-related osteoporosis without current pathological fracture: Secondary | ICD-10-CM | POA: Diagnosis not present

## 2020-10-17 DIAGNOSIS — D61818 Other pancytopenia: Secondary | ICD-10-CM | POA: Diagnosis present

## 2020-10-17 DIAGNOSIS — Z800 Family history of malignant neoplasm of digestive organs: Secondary | ICD-10-CM | POA: Diagnosis not present

## 2020-10-17 DIAGNOSIS — E538 Deficiency of other specified B group vitamins: Secondary | ICD-10-CM | POA: Diagnosis not present

## 2020-10-17 LAB — CBC WITH DIFFERENTIAL/PLATELET
Abs Immature Granulocytes: 0.01 10*3/uL (ref 0.00–0.07)
Basophils Absolute: 0 10*3/uL (ref 0.0–0.1)
Basophils Relative: 1 %
Eosinophils Absolute: 0.1 10*3/uL (ref 0.0–0.5)
Eosinophils Relative: 2 %
HCT: 37.8 % — ABNORMAL LOW (ref 39.0–52.0)
Hemoglobin: 12.6 g/dL — ABNORMAL LOW (ref 13.0–17.0)
Immature Granulocytes: 0 %
Lymphocytes Relative: 19 %
Lymphs Abs: 0.8 10*3/uL (ref 0.7–4.0)
MCH: 31.1 pg (ref 26.0–34.0)
MCHC: 33.3 g/dL (ref 30.0–36.0)
MCV: 93.3 fL (ref 80.0–100.0)
Monocytes Absolute: 0.5 10*3/uL (ref 0.1–1.0)
Monocytes Relative: 12 %
Neutro Abs: 2.9 10*3/uL (ref 1.7–7.7)
Neutrophils Relative %: 66 %
Platelets: 141 10*3/uL — ABNORMAL LOW (ref 150–400)
RBC: 4.05 MIL/uL — ABNORMAL LOW (ref 4.22–5.81)
RDW: 13.4 % (ref 11.5–15.5)
WBC: 4.4 10*3/uL (ref 4.0–10.5)
nRBC: 0 % (ref 0.0–0.2)

## 2020-10-17 LAB — VITAMIN B12: Vitamin B-12: 160 pg/mL — ABNORMAL LOW (ref 180–914)

## 2020-10-17 LAB — RETIC PANEL
Immature Retic Fract: 2.8 % (ref 2.3–15.9)
RBC.: 4.05 MIL/uL — ABNORMAL LOW (ref 4.22–5.81)
Retic Count, Absolute: 49.8 10*3/uL (ref 19.0–186.0)
Retic Ct Pct: 1.2 % (ref 0.4–3.1)
Reticulocyte Hemoglobin: 34.1 pg (ref >27.9)

## 2020-10-17 LAB — HEPATIC FUNCTION PANEL
ALT: 5 U/L (ref 0–44)
AST: 13 U/L — ABNORMAL LOW (ref 15–41)
Albumin: 4.1 g/dL (ref 3.5–5.0)
Alkaline Phosphatase: 51 U/L (ref 38–126)
Bilirubin, Direct: <0.1 mg/dL (ref 0.0–0.2)
Total Bilirubin: 0.6 mg/dL (ref 0.3–1.2)
Total Protein: 6.7 g/dL (ref 6.5–8.1)

## 2020-10-17 LAB — LACTATE DEHYDROGENASE: LDH: 111 U/L (ref 98–192)

## 2020-10-17 LAB — TSH: TSH: 0.834 u[IU]/mL (ref 0.350–4.500)

## 2020-10-17 LAB — TECHNOLOGIST SMEAR REVIEW: Plt Morphology: ADEQUATE

## 2020-10-17 LAB — FOLATE: Folate: 9.4 ng/mL (ref >5.9)

## 2020-10-17 MED ORDER — VITAMIN B-12 1000 MCG PO TABS: 1000.0000 ug | ORAL_TABLET | Freq: Every day | ORAL | 1 refills | Status: DC

## 2020-10-17 NOTE — Addendum Note (Signed)
Addended by: Rickard Patience on: 10/17/2020 09:36 PM   Modules accepted: Orders

## 2020-10-17 NOTE — Progress Notes (Signed)
Patient last seen by Dr. Tasia Catchings in 2019 for kidney mass.  PCP has referred him back for anemia.  He reports that he does not have much energy.

## 2020-10-17 NOTE — Progress Notes (Signed)
Hematology/Oncology Consult note [facility] Telephone:([phone] [phone]   Patient Care Team: Baxter Hire, MD as PCP - General (Internal Medicine)  REFERRING PROVIDER: Denice Paradise CHIEF COMPLAINTS/REASON FOR VISIT:  Evaluation of pancytopenia  HISTORY OF PRESENTING ILLNESS:  Kevin [name] is a  86 y.o.  male with PMH listed below who was referred to me for evaluation of kidney mass.  Patient was recently seen by gastroenterology Dr. Vira Agar at the request of patient's primary care physician for consultation for evaluation and management of weight loss, history of colon polyp and a family history of colon cancer. Patient reports that he has lost 20 pounds for the past several years.  May be 4 pounds over the past 6 months. Reports that patient does not eat much and try to eat healthy food. There appears to be some intentional cutting back on calories.  Patient reports he is not as hungry as he used to be. Patient remains very active for his age.  He goes to gym 3 times a week.  Denies any abdominal pain, diarrhea, constipation change of bowel habits.  Denies seeing any blood in the stool. Denies drenching night sweats, fever or chills. Per GI note, last colonoscopy was in 2012 which reviewed benign polyps and internal hemorrhoids, small area of erosion in the cecum biopsy showed chronic active colitis.  Due to patient's age, he is not quite interested to have colonoscopy done and prefers to do noninvasive test first.  So a CT abdomen Pelvis with contrast was obtained.  CT image showed a 6.7 cm mass in the upper pole of the right kidney and a 1.5 cm mass in the lower pole of the left kidney.  Both suspicious for renal cell carcinoma. There is no evidence of metastatic disease within the abdomen or pelvis. Also noted large duodenal diverticulum, mildly enlarged prostate and aortic athero-sclerosis. Patient denies any flank pain, or  hematuria. Family history reviewed. Two sisters diagnosed with breast cancer.   Reviewed patient's previous lab work in [facility].  TSH was checked on 01/31/2017, normal.  PSA 2.05,    INTERVAL HISTORY Kevin [name] is a 86 y.o. male presents to reestablish care for evaluation of pancytopenia. Problems and complaints are listed below: Patient was last seen by me on 05/13/2018 for kidney mass. Patient underwent right nephrectomy which showed RCC, now clear cell type.  Patient did not follow-up since his surgery. He follows up with Dr. Erlene Quan and has had surveillance images done via urology office. 06/02/2020, CT abdomen pelvis showed no evidence of recurrence or metastatic disease.  Colonic diverticulosis.  Stable liver lesions, better characterize as benign hemangioma on previous MRI liver.  Moderately enlarged prostate.  Aortic atherosclerosis.  Patient has blood work done on 09/19/2020 which showed leukopenia with WBC count of 3.2, anemia with hemoglobin 12.5, Platelet count 1 26,000.  Patient was referred back to me for evaluation of pancytopenia  Today patient was accompanied by his wife.  Patient has hearing deficiency and wears hearing aids. He reports some bilateral lower abdomen pain for the past few weeks.  No nausea vomiting diarrhea. His weight has been stable recently. Appetite is fair . Review of Systems  Constitutional: Negative for chills, fever, malaise/fatigue and weight loss.  HENT: Negative for nosebleeds and sore throat.   Eyes: Negative for double vision, photophobia and redness.  Respiratory: Negative for cough, shortness of breath and wheezing.   Cardiovascular: Negative for chest pain, palpitations and orthopnea.  Gastrointestinal: Positive  for abdominal pain. Negative for blood in stool, nausea and vomiting.  Genitourinary: Negative for dysuria.  Musculoskeletal: Negative for back pain, myalgias and neck pain.  Skin: Negative for itching and rash.  Neurological:  Negative for dizziness, tingling and tremors.  Endo/Heme/Allergies: Negative for environmental allergies. Does not bruise/bleed easily.  Psychiatric/Behavioral: Negative for depression.    MEDICAL HISTORY:  Past Medical History:  Diagnosis Date  . Anemia    in past  . Asthma    none in years  . Broken arm    Pt was 19  . 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 Cataract cortical, senile   . Colon polyps   . Dementia ([place])    Parkinson's, loss of short term memory  . E coli infection 2020   blood stream  . GERD (gastroesophageal reflux disease)   . HOH (hard of hearing)    extremely HOH, wears aids  . Hyperlipidemia   . Hypertension   . Osteoporosis   . Pneumonia    AS A CHILD    SURGICAL HISTORY: Past Surgical History:  Procedure Laterality Date  . BACK SURGERY    . CATARACT EXTRACTION W/PHACO Left 05/16/2020   Procedure: CATARACT EXTRACTION PHACO AND INTRAOCULAR LENS PLACEMENT (IOC) LEFT 7.27 00:39.0 ;  Surgeon: Birder Robson, MD;  Location: [place];  Service: Ophthalmology;  Laterality: Left;  . CATARACT EXTRACTION W/PHACO Right 06/07/2020   Procedure: CATARACT EXTRACTION PHACO AND INTRAOCULAR LENS PLACEMENT (IOC) RIGHT 11.56 00:54.9;  Surgeon: Birder Robson, MD;  Location: [place];  Service: Ophthalmology;  Laterality: Right;  . COLONOSCOPY  2001, 2006, 2012  . ESOPHAGOGASTRODUODENOSCOPY  09/11/2000  . FLEXIBLE SIGMOIDOSCOPY  03/02/1997  . KYPHOPLASTY N/A 11/13/2019   Procedure: T12 KYPHOPLASTY;  Surgeon: Hessie Knows, MD;  Location: [facility];  Service: Orthopedics;  Laterality: N/A;  . KYPHOPLASTY N/A 12/03/2019   Procedure: L1 KYPHOPLASTY;  Surgeon: Hessie Knows, MD;  Location: [facility];  Service: Orthopedics;  Laterality: N/A;  . NASAL POLYP EXCISION    . NEPHRECTOMY    . ROBOT ASSISTED LAPAROSCOPIC NEPHRECTOMY Right 11/17/2018   Procedure: ROBOTIC ASSISTED LAPAROSCOPIC NEPHRECTOMY;  Surgeon: Hollice Espy, MD;  Location: [facility];  Service: Urology;  Laterality: Right;    SOCIAL HISTORY: Social History   Socioeconomic History  . Marital status: Married    Spouse name: [name]   . Number of children: 1  . Years of education: Not on file  . Highest education level: Not on file  Occupational History  . Occupation: retired    Comment: Network engineer.  Tobacco Use  . Smoking status: Never Smoker  . Smokeless tobacco: Never Used  Vaping Use  . Vaping Use: Never used  Substance and Sexual Activity  . Alcohol use: Never  . Drug use: Never  . Sexual activity: Not on file  Other Topics Concern  . Not on file  Social History Narrative  . Not on file   Social Determinants of Health   Financial Resource Strain: Not on file  Food Insecurity: Not on file  Transportation Needs: Not on file  Physical Activity: Not on file  Stress: Not on file  Social Connections: Not on file  Intimate Partner Violence: Not on file    FAMILY HISTORY: Family History  Problem Relation Age of Onset  . Heart disease Mother   . Pancreatitis Mother   . Emphysema Father   . Breast cancer Sister   . Breast cancer  Sister     ALLERGIES:  has No Known Allergies.  MEDICATIONS:  Current Outpatient Medications  Medication Sig Dispense Refill  . acetaminophen (TYLENOL) 325 MG tablet Take 650 mg by mouth every 6 (six) hours as needed.    . ALENDRONATE SODIUM PO Take by mouth once a week.    Marland Kitchen amLODipine (NORVASC) 5 MG tablet Take 5 mg by mouth daily.    . carbidopa-levodopa (SINEMET IR) 25-100 MG tablet Take 1 tablet by mouth 4 (four) times daily.     Marland Kitchen escitalopram (LEXAPRO) 10 MG tablet Take 10 mg by mouth daily.    . Multiple Vitamins-Minerals (PRESERVISION AREDS 2 PO) Take 1 tablet by mouth daily.    Marland Kitchen nystatin cream (MYCOSTATIN) Apply 1 application topically 2 (two) times daily.    . ondansetron (ZOFRAN-ODT) 4 MG disintegrating tablet Take 4 mg by mouth every 8 (eight) hours as needed for nausea  or vomiting.    . psyllium (REGULOID) 0.52 g capsule Take 0.52 g by mouth daily at 12 noon.    . rivastigmine (EXELON) 3 MG capsule Take 3 mg by mouth 2 (two) times daily.    . Sennosides (SENOKOT PO) Take by mouth as needed.    . simvastatin (ZOCOR) 10 MG tablet Take 10 mg by mouth every evening.     . Calcium Carb-Cholecalciferol (CALCIUM 600 + D PO) Take by mouth. (Patient not taking: Reported on 10/17/2020)     No current facility-administered medications for this visit.     PHYSICAL EXAMINATION: ECOG PERFORMANCE STATUS: 2 - Symptomatic, <50% confined to bed Vitals:   10/17/20 1411  BP: (!) 143/87  Pulse: 84  Temp: 97.8 F (36.6 C)  SpO2: 96%   Filed Weights   10/17/20 1411  Weight: 156 lb 12.8 oz (71.1 kg)    Physical Exam Constitutional:      General: He is not in acute distress.    Appearance: He is well-developed.     Comments: Frail, walks independently  HENT:     Head: Normocephalic and atraumatic.     Right Ear: External ear normal.     Left Ear: External ear normal.  Eyes:     General: No scleral icterus.    Conjunctiva/sclera: Conjunctivae normal.     Pupils: Pupils are equal, round, and reactive to light.  Cardiovascular:     Rate and Rhythm: Normal rate and regular rhythm.     Heart sounds: Normal heart sounds.  Pulmonary:     Effort: Pulmonary effort is normal. No respiratory distress.     Breath sounds: Normal breath sounds. No wheezing or rales.  Chest:     Chest wall: No tenderness.  Abdominal:     General: Bowel sounds are normal. There is no distension.     Palpations: Abdomen is soft. There is no mass.     Tenderness: There is no abdominal tenderness.  Musculoskeletal:        General: No deformity. Normal range of motion.     Cervical back: Normal range of motion and neck supple.  Lymphadenopathy:     Cervical: No cervical adenopathy.  Skin:    General: Skin is warm and dry.     Findings: No rash.  Neurological:     Mental Status: He  is alert and oriented to person, place, and time.     Cranial Nerves: No cranial nerve deficit.     Coordination: Coordination normal.  Psychiatric:        Behavior: Behavior  normal.        Thought Content: Thought content normal.      LABORATORY DATA:  I have reviewed the data as listed Lab Results  Component Value Date   WBC 4.4 10/17/2020   HGB 12.6 (L) 10/17/2020   HCT 37.8 (L) 10/17/2020   MCV 93.3 10/17/2020   PLT 141 (L) 10/17/2020   Recent Labs    12/01/19 1452 10/17/20 1517  NA 129*  --   K 3.7  --   CL 93*  --   CO2 27  --   GLUCOSE 116*  --   BUN 25*  --   CREATININE 1.42*  --   CALCIUM 9.2  --   GFRNONAA 45*  --   GFRAA 52*  --   PROT  --  6.7  ALBUMIN  --  4.1  AST  --  13*  ALT  --  5  ALKPHOS  --  51  BILITOT  --  0.6  BILIDIR  --  <0.1  IBILI  --  NOT CALCULATED   Iron/TIBC/Ferritin/ %Sat    Component Value Date/Time   FERRITIN 282 03/04/2019 0413        ASSESSMENT & PLAN:  1. Anemia, unspecified type   2. Leukopenia, unspecified type   3. Thrombocytopenia ([place])   4. Vitamin B12 deficiency    Pancytopenia, Recommend checking CBC, smear, TSH, B12, folate, ANA, LDH, reticulocyte panel, flow cytometry, hepatitis panel, LFT, multiple myeloma panel We discussed the possibility of proceeding with bone marrow biopsy if above work-up is inconclusive  #B12 deficiency, B12 level is 160.  Recommend patient to be started on oral vitamin B12 supplementation. History of RCC, continue follow-up with urology.  #Bilateral lower abdomen pain.  Etiology unknown.  Abdomen examination is benign.  Monitor symptoms for now.  CT scan recently showed no cancer recurrence. Recommend patient to check with primary care provider  Orders Placed This Encounter  Procedures  . Folate    Standing Status:   Future    Number of Occurrences:   1    Standing Expiration Date:   10/17/2021  . Vitamin B12    Standing Status:   Future    Number of Occurrences:   1     Standing Expiration Date:   10/17/2021  . TSH    Standing Status:   Future    Number of Occurrences:   1    Standing Expiration Date:   10/17/2021  . CBC with Differential/Platelet    Standing Status:   Future    Number of Occurrences:   1    Standing Expiration Date:   10/17/2021  . Retic Panel    Standing Status:   Future    Number of Occurrences:   1    Standing Expiration Date:   10/17/2021  . Kappa/lambda light chains    Standing Status:   Future    Number of Occurrences:   1    Standing Expiration Date:   10/17/2021  . Multiple Myeloma Panel (SPEP&IFE w/QIG)    Standing Status:   Future    Number of Occurrences:   1    Standing Expiration Date:   10/17/2021  . ANA, IFA (with reflex)    Standing Status:   Future    Number of Occurrences:   1    Standing Expiration Date:   10/17/2021  . Lactate dehydrogenase    Standing Status:   Future    Number of Occurrences:  1    Standing Expiration Date:   10/17/2021  . Flow cytometry panel-leukemia/lymphoma work-up    Standing Status:   Future    Number of Occurrences:   1    Standing Expiration Date:   10/17/2021  . Hepatitis panel, acute    Standing Status:   Future    Number of Occurrences:   1    Standing Expiration Date:   10/17/2021  . Hepatic function panel    Standing Status:   Future    Number of Occurrences:   1    Standing Expiration Date:   10/17/2021    All questions were answered. The patient knows to call the clinic with any problems questions or concerns.  Return of visit: 2 weeks  Earlie Server, MD, PhD Hematology Oncology [facility] at [facility] Pager- [phone] 10/17/2020

## 2020-10-18 ENCOUNTER — Telehealth: Payer: Self-pay

## 2020-10-18 LAB — MULTIPLE MYELOMA PANEL, SERUM
Albumin SerPl Elph-Mcnc: 3.7 g/dL (ref 2.9–4.4)
Albumin/Glob SerPl: 1.5 (ref 0.7–1.7)
Alpha 1: 0.2 g/dL (ref 0.0–0.4)
Alpha2 Glob SerPl Elph-Mcnc: 0.6 g/dL (ref 0.4–1.0)
B-Globulin SerPl Elph-Mcnc: 0.8 g/dL (ref 0.7–1.3)
Gamma Glob SerPl Elph-Mcnc: 0.9 g/dL (ref 0.4–1.8)
Globulin, Total: 2.5 g/dL (ref 2.2–3.9)
IgA: 234 mg/dL (ref 61–437)
IgG (Immunoglobin G), Serum: 1017 mg/dL (ref 603–1613)
IgM (Immunoglobulin M), Srm: 45 mg/dL (ref 15–143)
Total Protein ELP: 6.2 g/dL (ref 6.0–8.5)

## 2020-10-18 LAB — ANTINUCLEAR ANTIBODIES, IFA: ANA Ab, IFA: NEGATIVE

## 2020-10-18 LAB — HEPATITIS PANEL, ACUTE
HCV Ab: NONREACTIVE
Hep A IgM: NONREACTIVE
Hep B C IgM: NONREACTIVE
Hepatitis B Surface Ag: NONREACTIVE

## 2020-10-18 NOTE — Telephone Encounter (Signed)
-----   Message from Rickard Patience, MD sent at 10/17/2020  9:37 PM EST ----- Please let patient know that vitamin B12 level is low and I recommend him to start oral vitamin B12 supplementation.  Prescription was sent to pharmacy.

## 2020-10-18 NOTE — Telephone Encounter (Signed)
Patient's wife informed

## 2020-10-20 LAB — COMP PANEL: LEUKEMIA/LYMPHOMA

## 2020-11-01 ENCOUNTER — Other Ambulatory Visit (HOSPITAL_COMMUNITY): Payer: Self-pay | Admitting: Internal Medicine

## 2020-11-01 ENCOUNTER — Other Ambulatory Visit: Payer: Self-pay | Admitting: Internal Medicine

## 2020-11-01 DIAGNOSIS — K439 Ventral hernia without obstruction or gangrene: Secondary | ICD-10-CM

## 2020-11-04 ENCOUNTER — Inpatient Hospital Stay: Payer: Medicare HMO | Attending: Oncology | Admitting: Oncology

## 2020-11-04 ENCOUNTER — Other Ambulatory Visit: Payer: Self-pay

## 2020-11-04 ENCOUNTER — Encounter: Payer: Self-pay | Admitting: Oncology

## 2020-11-04 VITALS — BP 179/95 | HR 72 | Temp 98.0°F | Resp 18 | Wt 156.0 lb

## 2020-11-04 DIAGNOSIS — K219 Gastro-esophageal reflux disease without esophagitis: Secondary | ICD-10-CM | POA: Insufficient documentation

## 2020-11-04 DIAGNOSIS — F0280 Dementia in other diseases classified elsewhere without behavioral disturbance: Secondary | ICD-10-CM | POA: Diagnosis not present

## 2020-11-04 DIAGNOSIS — Z79899 Other long term (current) drug therapy: Secondary | ICD-10-CM | POA: Insufficient documentation

## 2020-11-04 DIAGNOSIS — Z800 Family history of malignant neoplasm of digestive organs: Secondary | ICD-10-CM | POA: Insufficient documentation

## 2020-11-04 DIAGNOSIS — G20 Parkinson's disease: Secondary | ICD-10-CM | POA: Diagnosis not present

## 2020-11-04 DIAGNOSIS — E785 Hyperlipidemia, unspecified: Secondary | ICD-10-CM | POA: Insufficient documentation

## 2020-11-04 DIAGNOSIS — E538 Deficiency of other specified B group vitamins: Secondary | ICD-10-CM

## 2020-11-04 DIAGNOSIS — M810 Age-related osteoporosis without current pathological fracture: Secondary | ICD-10-CM | POA: Insufficient documentation

## 2020-11-04 DIAGNOSIS — I10 Essential (primary) hypertension: Secondary | ICD-10-CM | POA: Insufficient documentation

## 2020-11-04 DIAGNOSIS — J45909 Unspecified asthma, uncomplicated: Secondary | ICD-10-CM | POA: Insufficient documentation

## 2020-11-04 DIAGNOSIS — Z8719 Personal history of other diseases of the digestive system: Secondary | ICD-10-CM | POA: Diagnosis not present

## 2020-11-04 DIAGNOSIS — Z803 Family history of malignant neoplasm of breast: Secondary | ICD-10-CM | POA: Insufficient documentation

## 2020-11-04 DIAGNOSIS — I700 Atherosclerosis of aorta: Secondary | ICD-10-CM | POA: Insufficient documentation

## 2020-11-04 DIAGNOSIS — D696 Thrombocytopenia, unspecified: Secondary | ICD-10-CM

## 2020-11-04 DIAGNOSIS — D61818 Other pancytopenia: Secondary | ICD-10-CM | POA: Insufficient documentation

## 2020-11-04 DIAGNOSIS — D649 Anemia, unspecified: Secondary | ICD-10-CM

## 2020-11-04 NOTE — Progress Notes (Signed)
Patient here for follow up. No new concerns. BP is elevated 179/95, denies headache, dizziness or light headedness.

## 2020-11-04 NOTE — Progress Notes (Signed)
Hematology/Oncology Consult note [facility] Telephone:([phone] [phone]   Patient Care Team: Baxter Hire, MD as PCP - General (Internal Medicine)  REFERRING PROVIDER: Denice Paradise CHIEF COMPLAINTS/REASON FOR VISIT:  Evaluation of pancytopenia  HISTORY OF PRESENTING ILLNESS:  Kevin [name] is a  87 y.o.  male with PMH listed below who was referred to me for evaluation of kidney mass.  Patient was recently seen by gastroenterology Dr. Vira Agar at the request of patient's primary care physician for consultation for evaluation and management of weight loss, history of colon polyp and a family history of colon cancer. Patient reports that he has lost 20 pounds for the past several years.  May be 4 pounds over the past 6 months. Reports that patient does not eat much and try to eat healthy food. There appears to be some intentional cutting back on calories.  Patient reports he is not as hungry as he used to be. Patient remains very active for his age.  He goes to gym 3 times a week.  Denies any abdominal pain, diarrhea, constipation change of bowel habits.  Denies seeing any blood in the stool. Denies drenching night sweats, fever or chills. Per GI note, last colonoscopy was in 2012 which reviewed benign polyps and internal hemorrhoids, small area of erosion in the cecum biopsy showed chronic active colitis.  Due to patient's age, he is not quite interested to have colonoscopy done and prefers to do noninvasive test first.  So a CT abdomen Pelvis with contrast was obtained.  CT image showed a 6.7 cm mass in the upper pole of the right kidney and a 1.5 cm mass in the lower pole of the left kidney.  Both suspicious for renal cell carcinoma. There is no evidence of metastatic disease within the abdomen or pelvis. Also noted large duodenal diverticulum, mildly enlarged prostate and aortic athero-sclerosis. Patient denies any flank pain, or  hematuria. Family history reviewed. Two sisters diagnosed with breast cancer.   Reviewed patient's previous lab work in [facility].  TSH was checked on 01/31/2017, normal.  PSA 2.05,   # 11/17/2018 Patient underwent right nephrectomy which showed RCC,  clear cell type.  Patient did not follow-up since his surgery.He follows up with Dr. Erlene Quan and has had surveillance images done via urology office. 06/02/2020, CT abdomen pelvis showed no evidence of recurrence or metastatic disease.  Colonic diverticulosis.  Stable liver lesions, better characterize as benign hemangioma on previous MRI liver.  Moderately enlarged prostate.  Aortic atherosclerosis.  # blood work done on 09/19/2020 which showed leukopenia with WBC count of 3.2, anemia with hemoglobin 12.5, Platelet count 1 26,000.  Patient was referred back to me for evaluation of pancytopenia  INTERVAL HISTORY Kevin [name] is a 87 y.o. male presents to reestablish care for evaluation of pancytopenia. Problems and complaints are listed below: He presents to discuss results. . . Review of Systems  Constitutional: Negative for chills, fever, malaise/fatigue and weight loss.  HENT: Negative for nosebleeds and sore throat.   Eyes: Negative for double vision, photophobia and redness.  Respiratory: Negative for cough, shortness of breath and wheezing.   Cardiovascular: Negative for chest pain, palpitations and orthopnea.  Gastrointestinal: Negative for abdominal pain, blood in stool, nausea and vomiting.  Genitourinary: Negative for dysuria.  Musculoskeletal: Negative for back pain, myalgias and neck pain.  Skin: Negative for itching and rash.  Neurological: Negative for dizziness, tingling and tremors.  Endo/Heme/Allergies: Negative for environmental allergies. Does not bruise/bleed  easily.  Psychiatric/Behavioral: Negative for depression.    MEDICAL HISTORY:  Past Medical History:  Diagnosis Date  . Anemia    in past  . Asthma    none in years   . Broken arm    Pt was 19  . 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 Cataract cortical, senile   . Colon polyps   . Dementia ([place])    Parkinson's, loss of short term memory  . E coli infection 2020   blood stream  . GERD (gastroesophageal reflux disease)   . HOH (hard of hearing)    extremely HOH, wears aids  . Hyperlipidemia   . Hypertension   . Osteoporosis   . Pneumonia    AS A CHILD    SURGICAL HISTORY: Past Surgical History:  Procedure Laterality Date  . BACK SURGERY    . CATARACT EXTRACTION W/PHACO Left 05/16/2020   Procedure: CATARACT EXTRACTION PHACO AND INTRAOCULAR LENS PLACEMENT (IOC) LEFT 7.27 00:39.0 ;  Surgeon: Birder Robson, MD;  Location: [facility];  Service: Ophthalmology;  Laterality: Left;  . CATARACT EXTRACTION W/PHACO Right 06/07/2020   Procedure: CATARACT EXTRACTION PHACO AND INTRAOCULAR LENS PLACEMENT (IOC) RIGHT 11.56 00:54.9;  Surgeon: Birder Robson, MD;  Location: [facility];  Service: Ophthalmology;  Laterality: Right;  . COLONOSCOPY  2001, 2006, 2012  . ESOPHAGOGASTRODUODENOSCOPY  09/11/2000  . FLEXIBLE SIGMOIDOSCOPY  03/02/1997  . KYPHOPLASTY N/A 11/13/2019   Procedure: T12 KYPHOPLASTY;  Surgeon: Hessie Knows, MD;  Location: [facility];  Service: Orthopedics;  Laterality: N/A;  . KYPHOPLASTY N/A 12/03/2019   Procedure: L1 KYPHOPLASTY;  Surgeon: Hessie Knows, MD;  Location: [facility];  Service: Orthopedics;  Laterality: N/A;  . NASAL POLYP EXCISION    . NEPHRECTOMY    . ROBOT ASSISTED LAPAROSCOPIC NEPHRECTOMY Right 11/17/2018   Procedure: ROBOTIC ASSISTED LAPAROSCOPIC NEPHRECTOMY;  Surgeon: Hollice Espy, MD;  Location: [facility];  Service: Urology;  Laterality: Right;    SOCIAL HISTORY: Social History   Socioeconomic History  . Marital status: Married    Spouse name: [name]   . Number of children: 1  . Years of education: Not on file  . Highest education level: Not on file   Occupational History  . Occupation: retired    Comment: Network engineer.  Tobacco Use  . Smoking status: Never Smoker  . Smokeless tobacco: Never Used  Vaping Use  . Vaping Use: Never used  Substance and Sexual Activity  . Alcohol use: Never  . Drug use: Never  . Sexual activity: Not on file  Other Topics Concern  . Not on file  Social History Narrative  . Not on file   Social Determinants of Health   Financial Resource Strain: Not on file  Food Insecurity: Not on file  Transportation Needs: Not on file  Physical Activity: Not on file  Stress: Not on file  Social Connections: Not on file  Intimate Partner Violence: Not on file    FAMILY HISTORY: Family History  Problem Relation Age of Onset  . Heart disease Mother   . Pancreatitis Mother   . Emphysema Father   . Breast cancer Sister   . Breast cancer Sister     ALLERGIES:  has No Known Allergies.  MEDICATIONS:  Current Outpatient Medications  Medication Sig Dispense Refill  . acetaminophen (TYLENOL) 325 MG tablet Take 650 mg by mouth every 6 (six) hours as needed.    . ALENDRONATE SODIUM PO Take by mouth  once a week.    Marland Kitchen amLODipine (NORVASC) 5 MG tablet Take 5 mg by mouth daily.    . carbidopa-levodopa (SINEMET IR) 25-100 MG tablet Take 1 tablet by mouth 4 (four) times daily.     Marland Kitchen escitalopram (LEXAPRO) 10 MG tablet Take 10 mg by mouth daily.    . Multiple Vitamins-Minerals (PRESERVISION AREDS 2 PO) Take 1 tablet by mouth daily.    Marland Kitchen nystatin cream (MYCOSTATIN) Apply 1 application topically 2 (two) times daily.    . ondansetron (ZOFRAN-ODT) 4 MG disintegrating tablet Take 4 mg by mouth every 8 (eight) hours as needed for nausea or vomiting.    . psyllium (REGULOID) 0.52 g capsule Take 0.52 g by mouth daily at 12 noon.    . rivastigmine (EXELON) 3 MG capsule Take 3 mg by mouth 2 (two) times daily.    . simvastatin (ZOCOR) 10 MG tablet Take 10 mg by mouth every evening.     . vitamin B-12 (CYANOCOBALAMIN) 1000 MCG  tablet Take 1 tablet (1,000 mcg total) by mouth daily. 90 tablet 1  . Calcium Carb-Cholecalciferol (CALCIUM 600 + D PO) Take by mouth. (Patient not taking: Reported on 11/04/2020)    . Sennosides (SENOKOT PO) Take by mouth as needed. (Patient not taking: Reported on 11/04/2020)     No current facility-administered medications for this visit.     PHYSICAL EXAMINATION: ECOG PERFORMANCE STATUS: 2 - Symptomatic, <50% confined to bed Vitals:   11/04/20 1045  BP: (!) 179/95  Pulse: 72  Resp: 18  Temp: 98 F (36.7 C)   Filed Weights   11/04/20 1045  Weight: 156 lb (70.8 kg)    Physical Exam Constitutional:      General: He is not in acute distress.    Appearance: He is well-developed.     Comments: Frail, walks independently  HENT:     Head: Normocephalic and atraumatic.     Right Ear: External ear normal.     Left Ear: External ear normal.  Eyes:     General: No scleral icterus.    Conjunctiva/sclera: Conjunctivae normal.     Pupils: Pupils are equal, round, and reactive to light.  Cardiovascular:     Rate and Rhythm: Normal rate and regular rhythm.     Heart sounds: Normal heart sounds.  Pulmonary:     Effort: Pulmonary effort is normal. No respiratory distress.     Breath sounds: Normal breath sounds. No wheezing or rales.  Chest:     Chest wall: No tenderness.  Abdominal:     General: Bowel sounds are normal. There is no distension.     Palpations: Abdomen is soft. There is no mass.     Tenderness: There is no abdominal tenderness.  Musculoskeletal:        General: No deformity. Normal range of motion.     Cervical back: Normal range of motion and neck supple.  Lymphadenopathy:     Cervical: No cervical adenopathy.  Skin:    General: Skin is warm and dry.     Findings: No rash.  Neurological:     Mental Status: He is alert. Mental status is at baseline.     Cranial Nerves: No cranial nerve deficit.     Coordination: Coordination normal.      LABORATORY DATA:   I have reviewed the data as listed Lab Results  Component Value Date   WBC 4.4 10/17/2020   HGB 12.6 (L) 10/17/2020   HCT 37.8 (L) 10/17/2020  MCV 93.3 10/17/2020   PLT 141 (L) 10/17/2020   Recent Labs    12/01/19 1452 10/17/20 1517  NA 129*  --   K 3.7  --   CL 93*  --   CO2 27  --   GLUCOSE 116*  --   BUN 25*  --   CREATININE 1.42*  --   CALCIUM 9.2  --   GFRNONAA 45*  --   GFRAA 52*  --   PROT  --  6.7  ALBUMIN  --  4.1  AST  --  13*  ALT  --  5  ALKPHOS  --  51  BILITOT  --  0.6  BILIDIR  --  <0.1  IBILI  --  NOT CALCULATED   Iron/TIBC/Ferritin/ %Sat    Component Value Date/Time   FERRITIN 282 03/04/2019 0413        ASSESSMENT & PLAN:  1. Anemia, unspecified type   2. Thrombocytopenia ([place])   3. Vitamin B12 deficiency    Labs are reviewed and discussed with patient. # Anemia, hb is slightly low. Monitor. See below.  # Mild thrombocytopenia, monitor.  #B12 deficiency , B12 level is 160. Continue  B12 supplementation. History of RCC, continue follow-up with urology.   Orders Placed This Encounter  Procedures  . CBC with Differential/Platelet    Standing Status:   Future    Standing Expiration Date:   11/04/2021  . Vitamin B12    Standing Status:   Future    Standing Expiration Date:   11/04/2021  . Kappa/lambda light chains    Standing Status:   Future    Standing Expiration Date:   11/04/2021    All questions were answered. The patient knows to call the clinic with any problems questions or concerns.  Return of visit: 3 months.  Earlie Server, MD, PhD Hematology Oncology [facility] at [facility] Pager- [phone] 11/04/2020

## 2020-11-09 ENCOUNTER — Other Ambulatory Visit: Payer: Self-pay

## 2020-11-09 ENCOUNTER — Ambulatory Visit
Admission: RE | Admit: 2020-11-09 | Discharge: 2020-11-09 | Disposition: A | Discharge status: Home / Self Care | Payer: Medicare HMO | Source: Ambulatory Visit | Attending: Internal Medicine | Admitting: Internal Medicine

## 2020-11-09 DIAGNOSIS — K439 Ventral hernia without obstruction or gangrene: Secondary | ICD-10-CM | POA: Diagnosis present

## 2020-11-09 LAB — POCT I-STAT CREATININE: Creatinine, Ser: 1 mg/dL (ref 0.61–1.24)

## 2020-11-09 MED ORDER — IOHEXOL 300 MG/ML  SOLN
80.0000 mL | Freq: Once | INTRAMUSCULAR | Status: AC | PRN
  Administered 2020-11-09: 80 mL via INTRAVENOUS

## 2021-01-23 ENCOUNTER — Emergency Department: Payer: Medicare HMO

## 2021-01-23 ENCOUNTER — Other Ambulatory Visit: Payer: Self-pay

## 2021-01-23 ENCOUNTER — Inpatient Hospital Stay: Payer: Medicare HMO

## 2021-01-23 ENCOUNTER — Inpatient Hospital Stay
Admission: EM | Admit: 2021-01-23 | Discharge: 2021-01-26 | DRG: 440 | Disposition: A | Discharge status: Home Health | Payer: Medicare HMO | Attending: Internal Medicine | Admitting: Internal Medicine

## 2021-01-23 DIAGNOSIS — K859 Acute pancreatitis without necrosis or infection, unspecified: Secondary | ICD-10-CM | POA: Diagnosis present

## 2021-01-23 DIAGNOSIS — G20 Parkinson's disease: Secondary | ICD-10-CM | POA: Diagnosis present

## 2021-01-23 DIAGNOSIS — Z9841 Cataract extraction status, right eye: Secondary | ICD-10-CM

## 2021-01-23 DIAGNOSIS — K219 Gastro-esophageal reflux disease without esophagitis: Secondary | ICD-10-CM | POA: Diagnosis present

## 2021-01-23 DIAGNOSIS — I10 Essential (primary) hypertension: Secondary | ICD-10-CM | POA: Diagnosis present

## 2021-01-23 DIAGNOSIS — K8500 Idiopathic acute pancreatitis without necrosis or infection: Principal | ICD-10-CM

## 2021-01-23 DIAGNOSIS — F0390 Unspecified dementia without behavioral disturbance: Secondary | ICD-10-CM | POA: Diagnosis present

## 2021-01-23 DIAGNOSIS — Z66 Do not resuscitate: Secondary | ICD-10-CM | POA: Diagnosis present

## 2021-01-23 DIAGNOSIS — Z79899 Other long term (current) drug therapy: Secondary | ICD-10-CM | POA: Diagnosis not present

## 2021-01-23 DIAGNOSIS — R109 Unspecified abdominal pain: Secondary | ICD-10-CM

## 2021-01-23 DIAGNOSIS — Z9842 Cataract extraction status, left eye: Secondary | ICD-10-CM | POA: Diagnosis not present

## 2021-01-23 DIAGNOSIS — Z20822 Contact with and (suspected) exposure to covid-19: Secondary | ICD-10-CM | POA: Diagnosis present

## 2021-01-23 DIAGNOSIS — F0280 Dementia in other diseases classified elsewhere without behavioral disturbance: Secondary | ICD-10-CM | POA: Diagnosis present

## 2021-01-23 DIAGNOSIS — Z8249 Family history of ischemic heart disease and other diseases of the circulatory system: Secondary | ICD-10-CM | POA: Diagnosis not present

## 2021-01-23 DIAGNOSIS — Z905 Acquired absence of kidney: Secondary | ICD-10-CM

## 2021-01-23 DIAGNOSIS — E876 Hypokalemia: Secondary | ICD-10-CM | POA: Diagnosis present

## 2021-01-23 DIAGNOSIS — E785 Hyperlipidemia, unspecified: Secondary | ICD-10-CM | POA: Diagnosis present

## 2021-01-23 DIAGNOSIS — Z961 Presence of intraocular lens: Secondary | ICD-10-CM | POA: Diagnosis present

## 2021-01-23 DIAGNOSIS — H9190 Unspecified hearing loss, unspecified ear: Secondary | ICD-10-CM | POA: Diagnosis present

## 2021-01-23 DIAGNOSIS — M810 Age-related osteoporosis without current pathological fracture: Secondary | ICD-10-CM | POA: Diagnosis present

## 2021-01-23 DIAGNOSIS — R1011 Right upper quadrant pain: Secondary | ICD-10-CM

## 2021-01-23 DIAGNOSIS — R748 Abnormal levels of other serum enzymes: Secondary | ICD-10-CM | POA: Diagnosis not present

## 2021-01-23 LAB — URINALYSIS, ROUTINE W REFLEX MICROSCOPIC
Bacteria, UA: NONE SEEN
Bilirubin Urine: NEGATIVE
Glucose, UA: NEGATIVE mg/dL
Hgb urine dipstick: NEGATIVE
Ketones, ur: 5 mg/dL — AB
Leukocytes,Ua: NEGATIVE
Nitrite: NEGATIVE
Protein, ur: 100 mg/dL — AB
Specific Gravity, Urine: 1.019 (ref 1.005–1.030)
Squamous Epithelial / HPF: NONE SEEN (ref 0–5)
pH: 6 (ref 5.0–8.0)

## 2021-01-23 LAB — COMPREHENSIVE METABOLIC PANEL
ALT: 17 U/L (ref 0–44)
AST: 350 U/L — ABNORMAL HIGH (ref 15–41)
Albumin: 3.9 g/dL (ref 3.5–5.0)
Alkaline Phosphatase: 108 U/L (ref 38–126)
Anion gap: 7 (ref 5–15)
BUN: 28 mg/dL — ABNORMAL HIGH (ref 8–23)
CO2: 26 mmol/L (ref 22–32)
Calcium: 8.8 mg/dL — ABNORMAL LOW (ref 8.9–10.3)
Chloride: 104 mmol/L (ref 98–111)
Creatinine, Ser: 1.08 mg/dL (ref 0.61–1.24)
GFR, Estimated: >60 mL/min (ref >60)
Glucose, Bld: 96 mg/dL (ref 70–99)
Potassium: 3.1 mmol/L — ABNORMAL LOW (ref 3.5–5.1)
Sodium: 137 mmol/L (ref 135–145)
Total Bilirubin: 1.2 mg/dL (ref 0.3–1.2)
Total Protein: 6.5 g/dL (ref 6.5–8.1)

## 2021-01-23 LAB — LIPASE, BLOOD: Lipase: 3721 U/L — ABNORMAL HIGH (ref 11–51)

## 2021-01-23 LAB — CBC WITH DIFFERENTIAL/PLATELET
Abs Immature Granulocytes: 0.02 10*3/uL (ref 0.00–0.07)
Basophils Absolute: 0 10*3/uL (ref 0.0–0.1)
Basophils Relative: 0 %
Eosinophils Absolute: 0.1 10*3/uL (ref 0.0–0.5)
Eosinophils Relative: 1 %
HCT: 37 % — ABNORMAL LOW (ref 39.0–52.0)
Hemoglobin: 12.2 g/dL — ABNORMAL LOW (ref 13.0–17.0)
Immature Granulocytes: 0 %
Lymphocytes Relative: 9 %
Lymphs Abs: 0.5 10*3/uL — ABNORMAL LOW (ref 0.7–4.0)
MCH: 30.3 pg (ref 26.0–34.0)
MCHC: 33 g/dL (ref 30.0–36.0)
MCV: 92 fL (ref 80.0–100.0)
Monocytes Absolute: 0.1 10*3/uL (ref 0.1–1.0)
Monocytes Relative: 2 %
Neutro Abs: 5.3 10*3/uL (ref 1.7–7.7)
Neutrophils Relative %: 88 %
Platelets: 139 10*3/uL — ABNORMAL LOW (ref 150–400)
RBC: 4.02 MIL/uL — ABNORMAL LOW (ref 4.22–5.81)
RDW: 13 % (ref 11.5–15.5)
WBC: 6 10*3/uL (ref 4.0–10.5)
nRBC: 0 % (ref 0.0–0.2)

## 2021-01-23 LAB — RESP PANEL BY RT-PCR (FLU A&B, COVID) ARPGX2
Influenza A by PCR: NEGATIVE
Influenza B by PCR: NEGATIVE
SARS Coronavirus 2 by RT PCR: NEGATIVE

## 2021-01-23 LAB — TROPONIN I (HIGH SENSITIVITY)
Troponin I (High Sensitivity): 6 ng/L (ref <18)
Troponin I (High Sensitivity): 6 ng/L (ref <18)

## 2021-01-23 MED ORDER — MORPHINE SULFATE (PF) 4 MG/ML IV SOLN
4.0000 mg | Freq: Once | INTRAVENOUS | Status: AC
  Administered 2021-01-23: 4 mg via INTRAVENOUS
  Filled 2021-01-23: qty 1

## 2021-01-23 MED ORDER — CARBIDOPA-LEVODOPA 25-100 MG PO TABS
1.0000 | ORAL_TABLET | ORAL | Status: DC
  Administered 2021-01-23 – 2021-01-24 (×2): 1 via ORAL
  Filled 2021-01-23 (×3): qty 1

## 2021-01-23 MED ORDER — MORPHINE SULFATE (PF) 2 MG/ML IV SOLN
2.0000 mg | INTRAVENOUS | Status: DC | PRN
  Administered 2021-01-24: 2 mg via INTRAVENOUS
  Filled 2021-01-23: qty 1

## 2021-01-23 MED ORDER — MORPHINE SULFATE (PF) 4 MG/ML IV SOLN: 4.0000 mg | Freq: Once | INTRAVENOUS | Status: DC

## 2021-01-23 MED ORDER — CARBIDOPA-LEVODOPA 25-100 MG PO TABS
1.5000 | ORAL_TABLET | ORAL | Status: DC
  Administered 2021-01-24 (×2): 1.5 via ORAL
  Filled 2021-01-23: qty 1.5
  Filled 2021-01-23: qty 2
  Filled 2021-01-23: qty 1.5
  Filled 2021-01-23: qty 2
  Filled 2021-01-23: qty 1.5

## 2021-01-23 MED ORDER — ACETAMINOPHEN 325 MG PO TABS: 650.0000 mg | ORAL_TABLET | Freq: Four times a day (QID) | ORAL | Status: DC | PRN

## 2021-01-23 MED ORDER — ONDANSETRON HCL 4 MG/2ML IJ SOLN
4.0000 mg | Freq: Once | INTRAMUSCULAR | Status: AC
  Administered 2021-01-23: 4 mg via INTRAVENOUS
  Filled 2021-01-23: qty 2

## 2021-01-23 MED ORDER — PANTOPRAZOLE SODIUM 40 MG IV SOLR
40.0000 mg | INTRAVENOUS | Status: DC
  Administered 2021-01-23 – 2021-01-26 (×4): 40 mg via INTRAVENOUS
  Filled 2021-01-23 (×4): qty 40

## 2021-01-23 MED ORDER — NYSTATIN 100000 UNIT/GM EX CREA
1.0000 "application " | TOPICAL_CREAM | Freq: Two times a day (BID) | CUTANEOUS | Status: DC
  Administered 2021-01-23 – 2021-01-26 (×6): 1 via TOPICAL
  Filled 2021-01-23 (×3): qty 15

## 2021-01-23 MED ORDER — OCUVITE-LUTEIN PO CAPS
1.0000 | ORAL_CAPSULE | Freq: Every day | ORAL | Status: DC
  Administered 2021-01-24 – 2021-01-26 (×3): 1 via ORAL
  Filled 2021-01-23 (×4): qty 1

## 2021-01-23 MED ORDER — CARBIDOPA-LEVODOPA 25-100 MG PO TABS
1.0000 | ORAL_TABLET | Freq: Four times a day (QID) | ORAL | Status: DC
  Filled 2021-01-23 (×4): qty 1

## 2021-01-23 MED ORDER — VITAMIN B-12 1000 MCG PO TABS
1000.0000 ug | ORAL_TABLET | Freq: Every day | ORAL | Status: DC
  Administered 2021-01-24 – 2021-01-26 (×3): 1000 ug via ORAL
  Filled 2021-01-23 (×4): qty 1

## 2021-01-23 MED ORDER — RIVASTIGMINE TARTRATE 3 MG PO CAPS
3.0000 mg | ORAL_CAPSULE | Freq: Two times a day (BID) | ORAL | Status: DC
  Administered 2021-01-23 – 2021-01-26 (×6): 3 mg via ORAL
  Filled 2021-01-23 (×8): qty 1

## 2021-01-23 MED ORDER — IOHEXOL 350 MG/ML SOLN
100.0000 mL | Freq: Once | INTRAVENOUS | Status: AC | PRN
  Administered 2021-01-23: 100 mL via INTRAVENOUS

## 2021-01-23 MED ORDER — PSYLLIUM 95 % PO PACK
1.0000 | PACK | Freq: Every day | ORAL | Status: DC
  Administered 2021-01-25 – 2021-01-26 (×2): 1 via ORAL
  Filled 2021-01-23 (×4): qty 1

## 2021-01-23 MED ORDER — ESCITALOPRAM OXALATE 10 MG PO TABS
10.0000 mg | ORAL_TABLET | Freq: Every day | ORAL | Status: DC
  Administered 2021-01-24 – 2021-01-26 (×3): 10 mg via ORAL
  Filled 2021-01-23 (×4): qty 1

## 2021-01-23 MED ORDER — SODIUM CHLORIDE 0.9 % IV SOLN: INTRAVENOUS | Status: DC

## 2021-01-23 MED ORDER — ONDANSETRON HCL 4 MG/2ML IJ SOLN: 4.0000 mg | Freq: Four times a day (QID) | INTRAMUSCULAR | Status: DC | PRN

## 2021-01-23 MED ORDER — FENTANYL CITRATE (PF) 100 MCG/2ML IJ SOLN
50.0000 ug | Freq: Once | INTRAMUSCULAR | Status: AC
  Administered 2021-01-23: 50 ug via INTRAVENOUS
  Filled 2021-01-23: qty 2

## 2021-01-23 MED ORDER — ONDANSETRON HCL 4 MG PO TABS: 4.0000 mg | ORAL_TABLET | Freq: Four times a day (QID) | ORAL | Status: DC | PRN

## 2021-01-23 MED ORDER — POTASSIUM CHLORIDE IN NACL 20-0.9 MEQ/L-% IV SOLN
INTRAVENOUS | Status: DC
  Filled 2021-01-23 (×5): qty 1000

## 2021-01-23 MED ORDER — ENOXAPARIN SODIUM 40 MG/0.4ML ~~LOC~~ SOLN
40.0000 mg | SUBCUTANEOUS | Status: DC
  Administered 2021-01-23 – 2021-01-25 (×3): 40 mg via SUBCUTANEOUS
  Filled 2021-01-23 (×3): qty 0.4

## 2021-01-23 MED ORDER — AMLODIPINE BESYLATE 5 MG PO TABS
5.0000 mg | ORAL_TABLET | Freq: Every day | ORAL | Status: DC
  Administered 2021-01-24 – 2021-01-25 (×2): 5 mg via ORAL
  Filled 2021-01-23 (×3): qty 1

## 2021-01-23 MED ORDER — METOCLOPRAMIDE HCL 5 MG/ML IJ SOLN
5.0000 mg | Freq: Once | INTRAMUSCULAR | Status: AC
  Administered 2021-01-23: 5 mg via INTRAVENOUS
  Filled 2021-01-23: qty 2

## 2021-01-23 NOTE — ED Notes (Signed)
CT requesting new IV for scan, placed new IV in [facility].

## 2021-01-23 NOTE — ED Provider Notes (Signed)
[facility] Emergency Department Provider Note  ____________________________________________   Event Date/Time   First MD Initiated Contact with Patient 01/23/21 0258     (approximate)  I have reviewed the triage vital signs and the nursing notes.   HISTORY  Chief Complaint Chest Pain and Emesis    HPI Kevin [name] is a 87 y.o. male with history of Parkinson's, dementia, hypertension, hyperlipidemia who presents to the emergency department with EMS from home with complaints of chest pain, epigastric pain that radiates into his back with associated nausea and vomiting that started tonight prior to arrival.  Patient is hard of hearing and is a poor historian.  He states the pain feels like a "indigestion" and like a "strain".  He reports associated shortness of breath.  No fevers, cough, calf tenderness or calf swelling.  Given 4 mg of Zofran with EMS.  He states he did have some diarrhea today.        Past Medical History:  Diagnosis Date  . Anemia    in past  . Asthma    none in years  . Broken arm    Pt was 19  . 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 Cataract cortical, senile   . Colon polyps   . Dementia ([place])    Parkinson's, loss of short term memory  . E coli infection 2020   blood stream  . GERD (gastroesophageal reflux disease)   . HOH (hard of hearing)    extremely HOH, wears aids  . Hyperlipidemia   . Hypertension   . Osteoporosis   . Pneumonia    AS A CHILD    Patient Active Problem List   Diagnosis Date Noted  . Anemia 10/17/2020  . Leukopenia 10/17/2020  . Thrombocytopenia ([place]) 10/17/2020  . CAP (community acquired pneumonia) 03/04/2019  . Right renal mass 11/17/2018    Past Surgical History:  Procedure Laterality Date  . BACK SURGERY    . CATARACT EXTRACTION W/PHACO Left 05/16/2020   Procedure: CATARACT EXTRACTION PHACO AND INTRAOCULAR LENS PLACEMENT (IOC) LEFT 7.27 00:39.0 ;  Surgeon:  Birder Robson, MD;  Location: [place];  Service: Ophthalmology;  Laterality: Left;  . CATARACT EXTRACTION W/PHACO Right 06/07/2020   Procedure: CATARACT EXTRACTION PHACO AND INTRAOCULAR LENS PLACEMENT (IOC) RIGHT 11.56 00:54.9;  Surgeon: Birder Robson, MD;  Location: [place];  Service: Ophthalmology;  Laterality: Right;  . COLONOSCOPY  2001, 2006, 2012  . ESOPHAGOGASTRODUODENOSCOPY  09/11/2000  . FLEXIBLE SIGMOIDOSCOPY  03/02/1997  . KYPHOPLASTY N/A 11/13/2019   Procedure: T12 KYPHOPLASTY;  Surgeon: Hessie Knows, MD;  Location: [facility];  Service: Orthopedics;  Laterality: N/A;  . KYPHOPLASTY N/A 12/03/2019   Procedure: L1 KYPHOPLASTY;  Surgeon: Hessie Knows, MD;  Location: [facility];  Service: Orthopedics;  Laterality: N/A;  . NASAL POLYP EXCISION    . NEPHRECTOMY    . ROBOT ASSISTED LAPAROSCOPIC NEPHRECTOMY Right 11/17/2018   Procedure: ROBOTIC ASSISTED LAPAROSCOPIC NEPHRECTOMY;  Surgeon: Hollice Espy, MD;  Location: [facility];  Service: Urology;  Laterality: Right;    Prior to Admission medications   Medication Sig Start Date End Date Taking? Authorizing Provider  acetaminophen (TYLENOL) 325 MG tablet Take 650 mg by mouth every 6 (six) hours as needed.    [provider]  ALENDRONATE SODIUM PO Take by mouth once a week.    [provider]  amLODipine (NORVASC) 5 MG tablet Take 5 mg by mouth daily. 01/22/18  [provider]  Calcium Carb-Cholecalciferol (CALCIUM 600 + D PO) Take by mouth. Patient not taking: Reported on 11/04/2020    [provider]  carbidopa-levodopa (SINEMET IR) 25-100 MG tablet Take 1 tablet by mouth 4 (four) times daily.  06/15/19   [provider]  escitalopram (LEXAPRO) 10 MG tablet Take 10 mg by mouth daily.    [provider]  Multiple Vitamins-Minerals (PRESERVISION AREDS 2 PO) Take 1 tablet by mouth daily.    [provider]  nystatin cream (MYCOSTATIN) Apply 1  application topically 2 (two) times daily.    [provider]  ondansetron (ZOFRAN-ODT) 4 MG disintegrating tablet Take 4 mg by mouth every 8 (eight) hours as needed for nausea or vomiting.    [provider]  psyllium (REGULOID) 0.52 g capsule Take 0.52 g by mouth daily at 12 noon.    [provider]  rivastigmine (EXELON) 3 MG capsule Take 3 mg by mouth 2 (two) times daily.    [provider]  Sennosides (SENOKOT PO) Take by mouth as needed. Patient not taking: Reported on 11/04/2020    [provider]  simvastatin (ZOCOR) 10 MG tablet Take 10 mg by mouth every evening.  01/22/18   [provider]  vitamin B-12 (CYANOCOBALAMIN) 1000 MCG tablet Take 1 tablet (1,000 mcg total) by mouth daily. 10/17/20   Earlie Server, MD    Allergies Patient has no known allergies.  Family History  Problem Relation Age of Onset  . Heart disease Mother   . Pancreatitis Mother   . Emphysema Father   . Breast cancer Sister   . Breast cancer Sister     Social History Social History   Tobacco Use  . Smoking status: Never Smoker  . Smokeless tobacco: Never Used  Vaping Use  . Vaping Use: Never used  Substance Use Topics  . Alcohol use: Never  . Drug use: Never    Review of Systems Constitutional: No fever. Eyes: No visual changes. ENT: No sore throat. Cardiovascular: + chest pain. Respiratory: +shortness of breath. Gastrointestinal: + nausea, vomiting, diarrhea. Genitourinary: Negative for dysuria. Musculoskeletal: Negative for back pain. Skin: Negative for rash. Neurological: Negative for focal weakness or numbness.  ____________________________________________   PHYSICAL EXAM:  VITAL SIGNS: ED Triage Vitals  Enc Vitals Group     BP 01/23/21 0258 (!) 146/79     Pulse Rate 01/23/21 0256 89     Resp 01/23/21 0256 14     Temp 01/23/21 0258 97.6 F (36.4 C)     Temp src --      SpO2 01/23/21 0256 97 %     Weight 01/23/21 0259 150 lb  (68 kg)     Height 01/23/21 0259 6' (1.829 m)     Head Circumference --      Peak Flow --      Pain Score 01/23/21 0259 3     Pain Loc --      Pain Edu? --      Excl. in [place]? --    CONSTITUTIONAL: Alert and oriented and responds appropriately to questions.  Elderly, hard of hearing. HEAD: Normocephalic EYES: Conjunctivae clear, pupils appear equal, EOM appear intact ENT: normal nose; moist mucous membranes NECK: Supple, normal ROM CARD: RRR; S1 and S2 appreciated; no murmurs, no clicks, no rubs, no gallops RESP: Normal chest excursion without splinting or tachypnea; breath sounds clear and equal bilaterally; no wheezes, no rhonchi, no rales, no hypoxia or respiratory distress, speaking full  sentences ABD/GI: Normal bowel sounds; non-distended; soft, ender throughout the abdomen, no rebound, no guarding, no peritoneal signs, no hepatosplenomegaly BACK: The back appears normal EXT: Normal ROM in all joints; no deformity noted, no edema; no cyanosis, no calf tenderness or calf swelling SKIN: Normal color for age and race; warm; no rash on exposed skin NEURO: Moves all extremities equally PSYCH: The patient's mood and manner are appropriate.  ____________________________________________   LABS (all labs ordered are listed, but only abnormal results are displayed)  Labs Reviewed  CBC WITH DIFFERENTIAL/PLATELET - Abnormal; Notable for the following components:      Result Value   RBC 4.02 (*)    Hemoglobin 12.2 (*)    HCT 37.0 (*)    Platelets 139 (*)    Lymphs Abs 0.5 (*)    All other components within normal limits  COMPREHENSIVE METABOLIC PANEL - Abnormal; Notable for the following components:   Potassium 3.1 (*)    BUN 28 (*)    Calcium 8.8 (*)    AST 350 (*)    All other components within normal limits  LIPASE, BLOOD - Abnormal; Notable for the following components:   Lipase 3,721 (*)    All other components within normal limits  URINALYSIS, ROUTINE W REFLEX MICROSCOPIC -  Abnormal; Notable for the following components:   Color, Urine YELLOW (*)    APPearance CLEAR (*)    Ketones, ur 5 (*)    Protein, ur 100 (*)    All other components within normal limits  RESP PANEL BY RT-PCR (FLU A&B, COVID) ARPGX2  TROPONIN I (HIGH SENSITIVITY)  TROPONIN I (HIGH SENSITIVITY)   ____________________________________________  EKG   EKG Interpretation  Date/Time:  Monday January 23 2021 02:55:28 EDT Ventricular Rate:  87 PR Interval:  189 QRS Duration: 149 QT Interval:  410 QTC Calculation: 494 R Axis:   -69 Text Interpretation: Sinus rhythm RBBB and LAFB Left ventricular hypertrophy Confirmed by Pryor Curia [phone]) on 01/23/2021 3:06:53 AM       ____________________________________________  RADIOLOGY Jessie Foot Keesha Pellum, personally viewed and evaluated these images (plain radiographs) as part of my medical decision making, as well as reviewing the written report by the radiologist.  ED MD interpretation: Acute pancreatitis.  No dissection seen.  Official radiology report(s): CT Angio Chest/Abd/Pel for Dissection W and/or Wo Contrast  Result Date: 01/23/2021 CLINICAL DATA:  Chest and abdominal pain. Evaluate for aortic dissection. EXAM: CT ANGIOGRAPHY CHEST, ABDOMEN AND PELVIS TECHNIQUE: Non-contrast CT of the chest was initially obtained. Multidetector CT imaging through the chest, abdomen and pelvis was performed using the standard protocol during bolus administration of intravenous contrast. Multiplanar reconstructed images and MIPs were obtained and reviewed to evaluate the vascular anatomy. CONTRAST:  160mL OMNIPAQUE IOHEXOL 350 MG/ML SOLN COMPARISON:  CT scan of the abdomen and pelvis 11/09/2020; prior CTA of the chest 03/04/2019; prior thyroid ultrasound 07/11/2018; MRI abdomen 05/26/2018 FINDINGS: CTA CHEST FINDINGS Cardiovascular: Initial noncontrast enhanced images demonstrates no evidence of acute intramural hematoma. Following the administration of intravenous  contrast, there is adequate opacification of the thoracic aorta and branch vessels. Normal caliber aortic root and ascending thoracic aorta. No evidence of aneurysm. Conventional 3 vessel arch anatomy. Elongation of the aortic isthmus and proximal descending thoracic aorta resulting in a type 3 arch. Scattered atherosclerotic vascular calcifications. No evidence of dissection. Calcifications visualized along the coronary arteries. The heart is normal in size. No pericardial effusion. Normal caliber main pulmonary artery. Mediastinum/Nodes: Right-sided thyroid nodule is nonspecific by  CT imaging. This has been previously imaged with ultrasound and patient underwent biopsy on 07/11/2018. No mediastinal mass or adenopathy. Unremarkable esophagus. Lungs/Pleura: Mild dependent atelectasis. The lungs are otherwise clear. Musculoskeletal: No acute fracture or aggressive appearing lytic or blastic osseous lesion. Review of the MIP images confirms the above findings. CTA ABDOMEN AND PELVIS FINDINGS VASCULAR Aorta: Scattered calcified atherosclerotic plaque. No evidence of dissection or aneurysm. Celiac: Mild to moderate narrowing of the proximal celiac axis with mild poststenotic dilation 21.4 cm. Questionable beaded appearance of the proximal celiac artery in the region of narrowing. Differential considerations include fibromuscular dysplasia versus median arcuate ligament compression. SMA: Patent without evidence of aneurysm, dissection, vasculitis or significant stenosis. Renals: Solitary left kidney. Minimal atherosclerotic plaque without stenosis, dissection, aneurysm or changes of fibromuscular dysplasia. IMA: Patent without evidence of aneurysm, dissection, vasculitis or significant stenosis. Inflow: Patent without evidence of aneurysm, dissection, vasculitis or significant stenosis. Veins: No obvious venous abnormality within the limitations of this arterial phase study. Review of the MIP images confirms the above  findings. NON-VASCULAR Hepatobiliary: Stable wedge-shaped defect in the posterior periphery of hepatic segment 6 compared to prior imaging from January. No discrete hepatic lesion. Gallbladder is unremarkable. No intra or extrahepatic biliary ductal dilatation. Pancreas: Diffusely edematous appearance of the pancreas, particularly the pancreatic head. No ductal dilatation. Inflammatory stranding surrounds the entirety of the pancreas and extends into the porta hepatis and mesenteric root. No pseudocyst or focal fluid collection. Spleen: Normal in size without focal abnormality. Adrenals/Urinary Tract: Normal adrenal glands. Absent right kidney. Left kidney is normal in caliber without evidence of hydronephrosis or nephrolithiasis. Circumscribed low-attenuation renal lesions consistent with cysts of varying complexity. No change compared to prior imaging. Specifically, the intermediate attenuation cystic structure exophytic from the lateral lower pole of the left kidney demonstrates no significant interval change dating back to July of 2019. On prior MRI evaluation this was found to be most consistent with a complex proteinaceous/hemorrhagic cyst. Stomach/Bowel: 2 large duodenal diverticula are present arising from the D2 and D3 segments. Similar findings were seen previously. Secondary inflammatory changes noted along the gastric antrum and duodenum. Moderately large amount of formed stool in the rectum consistent with constipation. There are a few scattered colonic diverticula. Lymphatic: No suspicious lymphadenopathy. Reproductive: Prostate is unremarkable. Other: Fat containing left inguinal hernia.  No free fluid. Musculoskeletal: No acute fracture or aggressive appearing lytic or blastic osseous lesion. T12 and L1 compression fractures status post cement augmentation. Additional compression fracture at T7. No acute abnormality. Multilevel degenerative changes most significant at L5-S1. Review of the MIP images  confirms the above findings. IMPRESSION: 1. CT findings most consistent with at least moderately severe acute pancreatitis. Diffusely edematous pancreas, particularly the pancreatic head with extensive inflammatory stranding in the peripancreatic soft tissues extending into the porta hepatis and mesenteric root. No evidence of pseudocyst, free fluid or vascular complication at this time. 2. Similar appearance of duodenal diverticula. 3. Additional ancillary findings as above. Electronically Signed   By: Jacqulynn Cadet M.D.   On: 01/23/2021 06:26    ____________________________________________   PROCEDURES  Procedure(s) performed (including Critical Care):  Procedures  ____________________________________________   INITIAL IMPRESSION / ASSESSMENT AND PLAN / ED COURSE  As part of my medical decision making, I reviewed the following data within the [place] notes reviewed and incorporated, Labs reviewed , EKG interpreted , Old EKG reviewed, Old chart reviewed, Discussed with admitting physician  and Notes from prior ED visits  Patient here with complaints of chest pain, abdominal pain that radiates into his back.  Differential is large including ACS, PE, dissection, pneumonia, pancreatitis, gastritis, viral gastroenteritis, GERD, cholelithiasis, cholecystitis, colitis, diverticulitis, appendicitis, UTI.  Will obtain CT of the chest, abdomen and pelvis for further evaluation, cardiac labs, abdominal labs, urine.  Will give pain and nausea medicine.  EKG shows bifascicular block.  ED PROGRESS  Patient's work-up reveals a lipase of 3721.  LFTs within normal limits other than elevated AST.  Troponin x2 is flat and negative.  Urine shows no sign of infection.  CT scan shows moderately severe acute pancreatitis without pseudocyst or necrosis.  He continues to have pain despite several rounds of pain medication.  Will admit for pain control.  We will keep n.p.o.  and continue IV fluids.  Patient states that he thinks he has had pancreatitis before.  Denies history of gallstones.  No history of alcohol use.  6:49 AM Discussed patient's case with hospitalist, Dr. Damita Dunnings.  I have recommended admission and patient (and family if present) agree with this plan. Admitting physician will place admission orders.   I reviewed all nursing notes, vitals, pertinent previous records and reviewed/interpreted all EKGs, lab and urine results, imaging (as available).   ____________________________________________   FINAL CLINICAL IMPRESSION(S) / ED DIAGNOSES  Final diagnoses:  Idiopathic acute pancreatitis without infection or necrosis     ED Discharge Orders    None      *Please note:  Kevin [name] was evaluated in Emergency Department on 01/23/2021 for the symptoms described in the history of present illness. He was evaluated in the context of the global COVID-19 pandemic, which necessitated consideration that the patient might be at risk for infection with the SARS-CoV-2 virus that causes COVID-19. Institutional protocols and algorithms that pertain to the evaluation of patients at risk for COVID-19 are in a state of rapid change based on information released by regulatory bodies including the CDC and federal and state organizations. These policies and algorithms were followed during the patient's care in the ED.  Some ED evaluations and interventions may be delayed as a result of limited staffing during and the pandemic.*   Note:  This document was prepared using Dragon voice recognition software and may include unintentional dictation errors.   Quasean Frye, Delice Bison, DO 01/23/21 [phone]

## 2021-01-23 NOTE — ED Notes (Signed)
Stretcher locked in lowest position, call bell in reach

## 2021-01-23 NOTE — ED Triage Notes (Signed)
Pt from home via EMS for epigastric/substernal chest pain starting 1hr PTA. Reports emesis x3-4 PTA. A&Ox4, denies cardiac hx. RBB noted on arrival EKG.

## 2021-01-23 NOTE — Plan of Care (Signed)
  Problem: Health Behavior/Discharge Planning: Goal: Ability to manage health-related needs will improve Outcome: Progressing   Problem: Clinical Measurements: Goal: Ability to maintain clinical measurements within normal limits will improve Outcome: Progressing Goal: Will remain free from infection Outcome: Progressing Goal: Diagnostic test results will improve Outcome: Progressing Goal: Respiratory complications will improve Outcome: Progressing Goal: Cardiovascular complication will be avoided Outcome: Progressing   Problem: Activity: Goal: Risk for activity intolerance will decrease Outcome: Progressing   Problem: Nutrition: Goal: Adequate nutrition will be maintained Outcome: Progressing   Problem: Elimination: Goal: Will not experience complications related to bowel motility Outcome: Progressing Goal: Will not experience complications related to urinary retention Outcome: Progressing   Problem: Pain Managment: Goal: General experience of comfort will improve Outcome: Progressing   Problem: Skin Integrity: Goal: Risk for impaired skin integrity will decrease Outcome: Progressing

## 2021-01-23 NOTE — ED Notes (Signed)
Pt's wife [name] updated with pt's consent.

## 2021-01-23 NOTE — ED Notes (Signed)
Pt returned to room from CT at this time.

## 2021-01-23 NOTE — H&P (Addendum)
History and Physical    Kevin [name] KDT:[id number] DOB: 13-Aug-1933 DOA: 01/23/2021  PCP: Baxter Hire, MD   Patient coming from: Home  I have personally briefly reviewed patient's old medical records in [place]  Chief Complaint: Chest pain/epigastric pain Most of the history was obtained from patient's wife over the phone.  Patient is very hard of hearing.  HPI: Kevin [name] is a 87 y.o. male with medical history significant for Parkinson's disease, macular degeneration, dementia, hypertension who was brought into the ER by EMS for evaluation of sudden onset epigastric/substernal chest pain that started about 2 AM associated with nausea and multiple episodes of emesis.  Per patient's wife he thought he was having a heart attack which is why she called EMS.   She states that they were out of town this past weekend and ate out a lot, mostly [place] food and hamburgers which ordinarily the patient will not eat.  She denies any alcohol use or any recent medication changes. Unable to do a review of systems on this patient. Labs show sodium 137, potassium 3.1, chloride 104, bicarb 26, glucose 96, BUN 28, creatinine 1.08, calcium 8.8, alkaline phosphatase 108, albumin 3.9, lipase 3721, AST 350, ALT 17, total protein 6.5, troponin VI, white count 6.0, hemoglobin 12.2, hematocrit 37, MCV 92, RDW 13, platelet count 139 Respiratory viral panel is negative CT angiogram chest abdomen and pelvis shows . CT findings most consistent with at least moderately severe acute pancreatitis. Diffusely edematous pancreas, particularly the pancreatic head with extensive inflammatory stranding in the peripancreatic soft tissues extending into the porta hepatis and mesenteric root. No evidence of pseudocyst, free fluid or vascular complication at this time. Similar appearance of duodenal diverticula. Twelve-lead EKG reviewed by me shows sinus rhythm, right bundle branch block with left anterior fascicular block.   LVH   ED Course: Patient is an 87 year old male who presents to the ER via EMS for evaluation of severe epigastric/substernal chest pain that woke him up out of his sleep associated with nausea and multiple episodes of emesis.  Labs show elevated lipase level and imaging is consistent with acute pancreatitis.  Patient will be admitted to the hospital for further evaluation.    Review of Systems: As per HPI otherwise all other systems reviewed and negative.    Past Medical History:  Diagnosis Date  . Anemia    in past  . Asthma    none in years  . Broken arm    Pt was 19  . 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 Cataract cortical, senile   . Colon polyps   . Dementia ([place])    Parkinson's, loss of short term memory  . E coli infection 2020   blood stream  . GERD (gastroesophageal reflux disease)   . HOH (hard of hearing)    extremely HOH, wears aids  . Hyperlipidemia   . Hypertension   . Osteoporosis   . Pneumonia    AS A CHILD    Past Surgical History:  Procedure Laterality Date  . BACK SURGERY    . CATARACT EXTRACTION W/PHACO Left 05/16/2020   Procedure: CATARACT EXTRACTION PHACO AND INTRAOCULAR LENS PLACEMENT (IOC) LEFT 7.27 00:39.0 ;  Surgeon: Birder Robson, MD;  Location: [facility];  Service: Ophthalmology;  Laterality: Left;  . CATARACT EXTRACTION W/PHACO Right 06/07/2020   Procedure: CATARACT EXTRACTION PHACO AND INTRAOCULAR LENS PLACEMENT (IOC) RIGHT 11.56 00:54.9;  Surgeon: Birder Robson,  MD;  Location: [facility];  Service: Ophthalmology;  Laterality: Right;  . COLONOSCOPY  2001, 2006, 2012  . ESOPHAGOGASTRODUODENOSCOPY  09/11/2000  . FLEXIBLE SIGMOIDOSCOPY  03/02/1997  . KYPHOPLASTY N/A 11/13/2019   Procedure: T12 KYPHOPLASTY;  Surgeon: Hessie Knows, MD;  Location: [facility];  Service: Orthopedics;  Laterality: N/A;  . KYPHOPLASTY N/A 12/03/2019   Procedure: L1 KYPHOPLASTY;  Surgeon: Hessie Knows, MD;   Location: [facility];  Service: Orthopedics;  Laterality: N/A;  . NASAL POLYP EXCISION    . NEPHRECTOMY    . ROBOT ASSISTED LAPAROSCOPIC NEPHRECTOMY Right 11/17/2018   Procedure: ROBOTIC ASSISTED LAPAROSCOPIC NEPHRECTOMY;  Surgeon: Hollice Espy, MD;  Location: [facility];  Service: Urology;  Laterality: Right;     reports that he has never smoked. He has never used smokeless tobacco. He reports that he does not drink alcohol and does not use drugs.  No Known Allergies  Family History  Problem Relation Age of Onset  . Heart disease Mother   . Pancreatitis Mother   . Emphysema Father   . Breast cancer Sister   . Breast cancer Sister       Prior to Admission medications   Medication Sig Start Date End Date Taking? Authorizing Provider  acetaminophen (TYLENOL) 325 MG tablet Take 650 mg by mouth every 6 (six) hours as needed.    [provider]  ALENDRONATE SODIUM PO Take by mouth once a week.    [provider]  amLODipine (NORVASC) 5 MG tablet Take 5 mg by mouth daily. 01/22/18   [provider]  Calcium Carb-Cholecalciferol (CALCIUM 600 + D PO) Take by mouth. Patient not taking: Reported on 11/04/2020    [provider]  carbidopa-levodopa (SINEMET IR) 25-100 MG tablet Take 1 tablet by mouth 4 (four) times daily.  06/15/19   [provider]  escitalopram (LEXAPRO) 10 MG tablet Take 10 mg by mouth daily.    [provider]  Multiple Vitamins-Minerals (PRESERVISION AREDS 2 PO) Take 1 tablet by mouth daily.    [provider]  nystatin cream (MYCOSTATIN) Apply 1 application topically 2 (two) times daily.    [provider]  ondansetron (ZOFRAN-ODT) 4 MG disintegrating tablet Take 4 mg by mouth every 8 (eight) hours as needed for nausea or vomiting.    [provider]  psyllium (REGULOID) 0.52 g capsule Take 0.52 g by mouth daily at 12 noon.    [provider]  rivastigmine (EXELON) 3 MG capsule Take 3 mg  by mouth 2 (two) times daily.    [provider]  Sennosides (SENOKOT PO) Take by mouth as needed. Patient not taking: Reported on 11/04/2020    [provider]  simvastatin (ZOCOR) 10 MG tablet Take 10 mg by mouth every evening.  01/22/18   [provider]  vitamin B-12 (CYANOCOBALAMIN) 1000 MCG tablet Take 1 tablet (1,000 mcg total) by mouth daily. 10/17/20   Earlie Server, MD    Physical Exam: Vitals:   01/23/21 0451 01/23/21 0530 01/23/21 0600 01/23/21 0834  BP: 132/75 (!) 152/88 (!) 143/80 122/65  Pulse: (!) 101 (!) 101 (!) 103 (!) 101  Resp: 20 17 16 18   Temp:      SpO2: 97% 97% 99% 95%  Weight:      Height:         Vitals:   01/23/21 0451 01/23/21 0530 01/23/21 0600 01/23/21 0834  BP: 132/75 (!) 152/88 (!) 143/80 122/65  Pulse: (!) 101 (!) 101 (!) 103 Marland Kitchen)  101  Resp: 20 17 16 18   Temp:      SpO2: 97% 97% 99% 95%  Weight:      Height:          Constitutional: Alert and oriented x 1 to  Person. Not in any apparent distress HEENT:      Head: Normocephalic and atraumatic.         Eyes: PERLA, EOMI, Conjunctivae are normal. Sclera is non-icteric.       Mouth/Throat: Mucous membranes are dry.       Neck: Supple with no signs of meningismus. Cardiovascular:  Tachycardic. No murmurs, gallops, or rubs. 2+ symmetrical distal pulses are present . No JVD. No LE edema Respiratory: Respiratory effort normal .Lungs sounds clear bilaterally. No wheezes, crackles, or rhonchi.  Gastrointestinal: Soft,  tender in the epigastrium and periumbilical area, and non distended with positive bowel sounds.  Genitourinary: No CVA tenderness. Musculoskeletal: Nontender with normal range of motion in all extremities. No cyanosis, or erythema of extremities.  Resting tremor in his extremities Neurologic:  Face is symmetric. Moving all extremities. No gross focal neurologic deficits  Skin: Skin is warm, dry.  No rash or ulcers Psychiatric: Mood and affect are normal    Labs  on Admission: I have personally reviewed following labs and imaging studies  CBC: Recent Labs  Lab 01/23/21 0305  WBC 6.0  NEUTROABS 5.3  HGB 12.2*  HCT 37.0*  MCV 92.0  PLT 594*   Basic Metabolic Panel: Recent Labs  Lab 01/23/21 0305  NA 137  K 3.1*  CL 104  CO2 26  GLUCOSE 96  BUN 28*  CREATININE 1.08  CALCIUM 8.8*   GFR: Estimated Creatinine Clearance: 47.2 mL/min (by C-G formula based on SCr of 1.08 mg/dL). Liver Function Tests: Recent Labs  Lab 01/23/21 0305  AST 350*  ALT 17  ALKPHOS 108  BILITOT 1.2  PROT 6.5  ALBUMIN 3.9   Recent Labs  Lab 01/23/21 0305  LIPASE 3,721*   No results for input(s): AMMONIA in the last 168 hours. Coagulation Profile: No results for input(s): INR, PROTIME in the last 168 hours. Cardiac Enzymes: No results for input(s): CKTOTAL, CKMB, CKMBINDEX, TROPONINI in the last 168 hours. BNP (last 3 results) No results for input(s): PROBNP in the last 8760 hours. HbA1C: No results for input(s): HGBA1C in the last 72 hours. CBG: No results for input(s): GLUCAP in the last 168 hours. Lipid Profile: No results for input(s): CHOL, HDL, LDLCALC, TRIG, CHOLHDL, LDLDIRECT in the last 72 hours. Thyroid Function Tests: No results for input(s): TSH, T4TOTAL, FREET4, T3FREE, THYROIDAB in the last 72 hours. Anemia Panel: No results for input(s): VITAMINB12, FOLATE, FERRITIN, TIBC, IRON, RETICCTPCT in the last 72 hours. Urine analysis:    Component Value Date/Time   COLORURINE YELLOW (A) 01/23/2021 0438   APPEARANCEUR CLEAR (A) 01/23/2021 0438   LABSPEC 1.019 01/23/2021 0438   PHURINE 6.0 01/23/2021 0438   GLUCOSEU NEGATIVE 01/23/2021 0438   HGBUR NEGATIVE 01/23/2021 0438   BILIRUBINUR NEGATIVE 01/23/2021 0438   KETONESUR 5 (A) 01/23/2021 0438   PROTEINUR 100 (A) 01/23/2021 0438   NITRITE NEGATIVE 01/23/2021 0438   LEUKOCYTESUR NEGATIVE 01/23/2021 0438    Radiological Exams on Admission: CT Angio Chest/Abd/Pel for Dissection W  and/or Wo Contrast  Result Date: 01/23/2021 CLINICAL DATA:  Chest and abdominal pain. Evaluate for aortic dissection. EXAM: CT ANGIOGRAPHY CHEST, ABDOMEN AND PELVIS TECHNIQUE: Non-contrast CT of the chest was initially obtained. Multidetector CT imaging through the chest,  abdomen and pelvis was performed using the standard protocol during bolus administration of intravenous contrast. Multiplanar reconstructed images and MIPs were obtained and reviewed to evaluate the vascular anatomy. CONTRAST:  168mL OMNIPAQUE IOHEXOL 350 MG/ML SOLN COMPARISON:  CT scan of the abdomen and pelvis 11/09/2020; prior CTA of the chest 03/04/2019; prior thyroid ultrasound 07/11/2018; MRI abdomen 05/26/2018 FINDINGS: CTA CHEST FINDINGS Cardiovascular: Initial noncontrast enhanced images demonstrates no evidence of acute intramural hematoma. Following the administration of intravenous contrast, there is adequate opacification of the thoracic aorta and branch vessels. Normal caliber aortic root and ascending thoracic aorta. No evidence of aneurysm. Conventional 3 vessel arch anatomy. Elongation of the aortic isthmus and proximal descending thoracic aorta resulting in a type 3 arch. Scattered atherosclerotic vascular calcifications. No evidence of dissection. Calcifications visualized along the coronary arteries. The heart is normal in size. No pericardial effusion. Normal caliber main pulmonary artery. Mediastinum/Nodes: Right-sided thyroid nodule is nonspecific by CT imaging. This has been previously imaged with ultrasound and patient underwent biopsy on 07/11/2018. No mediastinal mass or adenopathy. Unremarkable esophagus. Lungs/Pleura: Mild dependent atelectasis. The lungs are otherwise clear. Musculoskeletal: No acute fracture or aggressive appearing lytic or blastic osseous lesion. Review of the MIP images confirms the above findings. CTA ABDOMEN AND PELVIS FINDINGS VASCULAR Aorta: Scattered calcified atherosclerotic plaque. No  evidence of dissection or aneurysm. Celiac: Mild to moderate narrowing of the proximal celiac axis with mild poststenotic dilation 21.4 cm. Questionable beaded appearance of the proximal celiac artery in the region of narrowing. Differential considerations include fibromuscular dysplasia versus median arcuate ligament compression. SMA: Patent without evidence of aneurysm, dissection, vasculitis or significant stenosis. Renals: Solitary left kidney. Minimal atherosclerotic plaque without stenosis, dissection, aneurysm or changes of fibromuscular dysplasia. IMA: Patent without evidence of aneurysm, dissection, vasculitis or significant stenosis. Inflow: Patent without evidence of aneurysm, dissection, vasculitis or significant stenosis. Veins: No obvious venous abnormality within the limitations of this arterial phase study. Review of the MIP images confirms the above findings. NON-VASCULAR Hepatobiliary: Stable wedge-shaped defect in the posterior periphery of hepatic segment 6 compared to prior imaging from January. No discrete hepatic lesion. Gallbladder is unremarkable. No intra or extrahepatic biliary ductal dilatation. Pancreas: Diffusely edematous appearance of the pancreas, particularly the pancreatic head. No ductal dilatation. Inflammatory stranding surrounds the entirety of the pancreas and extends into the porta hepatis and mesenteric root. No pseudocyst or focal fluid collection. Spleen: Normal in size without focal abnormality. Adrenals/Urinary Tract: Normal adrenal glands. Absent right kidney. Left kidney is normal in caliber without evidence of hydronephrosis or nephrolithiasis. Circumscribed low-attenuation renal lesions consistent with cysts of varying complexity. No change compared to prior imaging. Specifically, the intermediate attenuation cystic structure exophytic from the lateral lower pole of the left kidney demonstrates no significant interval change dating back to July of 2019. On prior MRI  evaluation this was found to be most consistent with a complex proteinaceous/hemorrhagic cyst. Stomach/Bowel: 2 large duodenal diverticula are present arising from the D2 and D3 segments. Similar findings were seen previously. Secondary inflammatory changes noted along the gastric antrum and duodenum. Moderately large amount of formed stool in the rectum consistent with constipation. There are a few scattered colonic diverticula. Lymphatic: No suspicious lymphadenopathy. Reproductive: Prostate is unremarkable. Other: Fat containing left inguinal hernia.  No free fluid. Musculoskeletal: No acute fracture or aggressive appearing lytic or blastic osseous lesion. T12 and L1 compression fractures status post cement augmentation. Additional compression fracture at T7. No acute abnormality. Multilevel degenerative changes most significant at L5-S1.  Review of the MIP images confirms the above findings. IMPRESSION: 1. CT findings most consistent with at least moderately severe acute pancreatitis. Diffusely edematous pancreas, particularly the pancreatic head with extensive inflammatory stranding in the peripancreatic soft tissues extending into the porta hepatis and mesenteric root. No evidence of pseudocyst, free fluid or vascular complication at this time. 2. Similar appearance of duodenal diverticula. 3. Additional ancillary findings as above. Electronically Signed   By: Jacqulynn Cadet M.D.   On: 01/23/2021 06:26     Assessment/Plan Principal Problem:   Acute pancreatitis Active Problems:   Hypokalemia   Dementia (HCC)   GERD (gastroesophageal reflux disease)   Hypertension   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[place])    Acute pancreatitis Unclear etiology Keep patient n.p.o. except meds Supportive care with IV fluid hydration, pain control and antiemetics Obtain gallbladder ultrasound to evaluate for gallstones Review medications as possible etiology    Dementia  due to Parkinson's disease without behavioral disturbance Continue Sinemet, Lexapro and Exelon     Hypertension Blood pressure is stable  Continue amlodipine   Hypokalemia Related to GI loss from nausea and vomiting Supplement potassium   GERD Place patient on IV PPI  DVT prophylaxis: Lovenox Code Status: DO NOT RESUSCITATE Family Communication: Greater than 50% of time was spent discussing patient's condition and plan of care with his wife Kevin [name] over the phone.  All questions and concerns have been addressed.  She verbalizes understanding and agrees with the plan.  CODE STATUS was discussed and he is a DNR Disposition Plan: Back to previous home environment Consults called: none Status: At the time of admission, it appears that the appropriate admission status for this patient is inpatient.   This is judged to be reasonable and necessary in order to provide the required intensity of service to ensure the patient's safety given the presenting symptoms, physical exam findings, and initial radiographic and laboratory data in the context of their comorbid conditions. Patient requires inpatient status due to high intensity of service, high risk for further deterioration and high frequency of surveillance required.    Collier Bullock MD [facility]     01/23/2021, 8:57 AM

## 2021-01-23 NOTE — Progress Notes (Signed)
PO meds not given during this shift to due to pt level of alertness. Pt was very drowsy and unable to stay awake during verbal stimulation.

## 2021-01-24 DIAGNOSIS — F0280 Dementia in other diseases classified elsewhere without behavioral disturbance: Secondary | ICD-10-CM

## 2021-01-24 DIAGNOSIS — G20 Parkinson's disease: Secondary | ICD-10-CM

## 2021-01-24 LAB — CBC
HCT: 34.7 % — ABNORMAL LOW (ref 39.0–52.0)
Hemoglobin: 11.7 g/dL — ABNORMAL LOW (ref 13.0–17.0)
MCH: 30.6 pg (ref 26.0–34.0)
MCHC: 33.7 g/dL (ref 30.0–36.0)
MCV: 90.8 fL (ref 80.0–100.0)
Platelets: 123 10*3/uL — ABNORMAL LOW (ref 150–400)
RBC: 3.82 MIL/uL — ABNORMAL LOW (ref 4.22–5.81)
RDW: 13.6 % (ref 11.5–15.5)
WBC: 10.5 10*3/uL (ref 4.0–10.5)
nRBC: 0 % (ref 0.0–0.2)

## 2021-01-24 LAB — HEPATIC FUNCTION PANEL
ALT: 55 U/L — ABNORMAL HIGH (ref 0–44)
AST: 133 U/L — ABNORMAL HIGH (ref 15–41)
Albumin: 3 g/dL — ABNORMAL LOW (ref 3.5–5.0)
Alkaline Phosphatase: 98 U/L (ref 38–126)
Bilirubin, Direct: 0.3 mg/dL — ABNORMAL HIGH (ref 0.0–0.2)
Indirect Bilirubin: 0.7 mg/dL (ref 0.3–0.9)
Total Bilirubin: 1 mg/dL (ref 0.3–1.2)
Total Protein: 5.4 g/dL — ABNORMAL LOW (ref 6.5–8.1)

## 2021-01-24 LAB — LIPID PANEL
Cholesterol: 102 mg/dL (ref 0–200)
HDL: 50 mg/dL (ref >40)
LDL Cholesterol: 44 mg/dL (ref 0–99)
Total CHOL/HDL Ratio: 2 RATIO
Triglycerides: 38 mg/dL (ref <150)
VLDL: 8 mg/dL (ref 0–40)

## 2021-01-24 LAB — BASIC METABOLIC PANEL
Anion gap: 6 (ref 5–15)
BUN: 29 mg/dL — ABNORMAL HIGH (ref 8–23)
CO2: 23 mmol/L (ref 22–32)
Calcium: 7.8 mg/dL — ABNORMAL LOW (ref 8.9–10.3)
Chloride: 107 mmol/L (ref 98–111)
Creatinine, Ser: 0.93 mg/dL (ref 0.61–1.24)
GFR, Estimated: >60 mL/min (ref >60)
Glucose, Bld: 77 mg/dL (ref 70–99)
Potassium: 4 mmol/L (ref 3.5–5.1)
Sodium: 136 mmol/L (ref 135–145)

## 2021-01-24 MED ORDER — LABETALOL HCL 5 MG/ML IV SOLN
5.0000 mg | Freq: Four times a day (QID) | INTRAVENOUS | Status: DC | PRN
  Administered 2021-01-24 – 2021-01-25 (×3): 5 mg via INTRAVENOUS
  Filled 2021-01-24 (×4): qty 4

## 2021-01-24 MED ORDER — LACTATED RINGERS IV SOLN: INTRAVENOUS | Status: DC

## 2021-01-24 NOTE — Progress Notes (Addendum)
[facility]  PROGRESS NOTE  Kevin [name] ZOX:[id number] DOB: February 05, 1933 DOA: 01/23/2021 PCP: Baxter Hire, MD   Brief HPI:   87 year old male with history of Parkinson disease, macular degeneration, dementia, hypertension was brought into the ED by EMS for evaluation of sudden onset of epigastric/substernal chest pain which started on 2 AM associated with nausea and multiple episodes of vomiting.  In the ED CT findings were consistent with moderately severe acute pancreatitis.  Showed diffusely edematous pancreas particularly the pancreatic head with extensive inflammatory stranding in the peripancreatic soft tissue extending into the porta hepatis and mesenteric root. Lipase was elevated to 3,721    Subjective   Patient seen and examined, feels better this morning.  Denies abdominal pain.  No previous history of alcohol abuse.  Abdominal ultrasound showed no visualized cholelithiasis or choledocholithiasis.   Assessment/Plan:     1. Acute pancreatitis-patient had significantly elevated lipase 3,721 with CT scan abdomen showing moderately severe pancreatitis.  Abdominal ultrasound shows no cholelithiasis/choledocholithiasis.No clear etiology identified.  He did have elevated AST of 350, which is improved from 33 this morning.  ALT is up to 55.?  passed CBD stone. Will consult gastroenterology for further recommendations.  ContinueLR at 125 mL/h.  Continue n.p.o. 2. 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 Hypertension-blood pressure stable, continue amlodipine 4. Hypokalemia-replete    Scheduled medications:   . amLODipine  5 mg Oral Daily  . carbidopa-levodopa  1.5 tablet Oral 3 times per day   And  . carbidopa-levodopa  1 tablet Oral 2 times per day  . enoxaparin (LOVENOX) injection  40 mg Subcutaneous Q24H  . escitalopram  10 mg Oral Daily  .  morphine injection  4 mg Intravenous Once  . multivitamin-lutein  1  capsule Oral Daily  . nystatin cream  1 application Topical BID  . pantoprazole (PROTONIX) IV  40 mg Intravenous Q24H  . psyllium  1 packet Oral Q1200  . rivastigmine  3 mg Oral BID  . vitamin B-12  1,000 mcg Oral Daily         Data Reviewed:   CBG:  No results for input(s): GLUCAP in the last 168 hours.  SpO2: 93 %    Vitals:   01/23/21 2350 01/24/21 0311 01/24/21 0814 01/24/21 1351  BP: (!) 147/82 (!) 150/83 (!) 177/84 (!) 167/86  Pulse: 82 86 89 95  Resp: 18 20 20 18   Temp: 98.4 F (36.9 C) 98.1 F (36.7 C) 98.2 F (36.8 C)   TempSrc: Oral Oral    SpO2: 95% 95% 95% 93%  Weight:      Height:         Intake/Output Summary (Last 24 hours) at 01/24/2021 1505 Last data filed at 01/24/2021 0600 Gross per 24 hour  Intake 598.49 ml  Output 400 ml  Net 198.49 ml    04/03 1901 - 04/05 0700 In: 598.5 [P.O.:80; I.V.:518.5] Out: 650 [Urine:650]  Filed Weights   01/23/21 0259  Weight: 68 kg    CBC:  Recent Labs  Lab 01/23/21 0305 01/24/21 0501  WBC 6.0 10.5  HGB 12.2* 11.7*  HCT 37.0* 34.7*  PLT 139* 123*  MCV 92.0 90.8  MCH 30.3 30.6  MCHC 33.0 33.7  RDW 13.0 13.6  LYMPHSABS 0.5*  --   MONOABS 0.1  --   EOSABS 0.1  --   BASOSABS 0.0  --     Complete metabolic panel:  Recent Labs  Lab 01/23/21 0305 01/24/21  0501  NA 137 136  K 3.1* 4.0  CL 104 107  CO2 26 23  GLUCOSE 96 77  BUN 28* 29*  CREATININE 1.08 0.93  CALCIUM 8.8* 7.8*  AST 350* 133*  ALT 17 55*  ALKPHOS 108 98  BILITOT 1.2 1.0  ALBUMIN 3.9 3.0*    Recent Labs  Lab 01/23/21 0305  LIPASE 3,721*    Recent Labs  Lab 01/23/21 0642  SARSCOV2NAA NEGATIVE    ------------------------------------------------------------------------------------------------------------------ Recent Labs    01/24/21 0501  CHOL 102  HDL 50  LDLCALC 44  TRIG 38  CHOLHDL 2.0    No results found for:  HGBA1C ------------------------------------------------------------------------------------------------------------------ No results for input(s): TSH, T4TOTAL, T3FREE, THYROIDAB in the last 72 hours.  Invalid input(s): FREET3 ------------------------------------------------------------------------------------------------------------------ No results for input(s): VITAMINB12, FOLATE, FERRITIN, TIBC, IRON, RETICCTPCT in the last 72 hours.  Coagulation profile  No results for input(s): INR, PROTIME in the last 168 hours.  No results for input(s): DDIMER in the last 72 hours.  Cardiac Enzymes  No results for input(s): CKMB, TROPONINI, MYOGLOBIN in the last 168 hours.  Invalid input(s): CK ------------------------------------------------------------------------------------------------------------------    Component Value Date/Time   BNP 88.0 03/04/2019 0520     Antibiotics: Anti-infectives (From admission, onward)   None       Radiology Reports  CT Angio Chest/Abd/Pel for Dissection W and/or Wo Contrast  Result Date: 01/23/2021 CLINICAL DATA:  Chest and abdominal pain. Evaluate for aortic dissection. EXAM: CT ANGIOGRAPHY CHEST, ABDOMEN AND PELVIS TECHNIQUE: Non-contrast CT of the chest was initially obtained. Multidetector CT imaging through the chest, abdomen and pelvis was performed using the standard protocol during bolus administration of intravenous contrast. Multiplanar reconstructed images and MIPs were obtained and reviewed to evaluate the vascular anatomy. CONTRAST:  164mL OMNIPAQUE IOHEXOL 350 MG/ML SOLN COMPARISON:  CT scan of the abdomen and pelvis 11/09/2020; prior CTA of the chest 03/04/2019; prior thyroid ultrasound 07/11/2018; MRI abdomen 05/26/2018 FINDINGS: CTA CHEST FINDINGS Cardiovascular: Initial noncontrast enhanced images demonstrates no evidence of acute intramural hematoma. Following the administration of intravenous contrast, there is adequate opacification  of the thoracic aorta and branch vessels. Normal caliber aortic root and ascending thoracic aorta. No evidence of aneurysm. Conventional 3 vessel arch anatomy. Elongation of the aortic isthmus and proximal descending thoracic aorta resulting in a type 3 arch. Scattered atherosclerotic vascular calcifications. No evidence of dissection. Calcifications visualized along the coronary arteries. The heart is normal in size. No pericardial effusion. Normal caliber main pulmonary artery. Mediastinum/Nodes: Right-sided thyroid nodule is nonspecific by CT imaging. This has been previously imaged with ultrasound and patient underwent biopsy on 07/11/2018. No mediastinal mass or adenopathy. Unremarkable esophagus. Lungs/Pleura: Mild dependent atelectasis. The lungs are otherwise clear. Musculoskeletal: No acute fracture or aggressive appearing lytic or blastic osseous lesion. Review of the MIP images confirms the above findings. CTA ABDOMEN AND PELVIS FINDINGS VASCULAR Aorta: Scattered calcified atherosclerotic plaque. No evidence of dissection or aneurysm. Celiac: Mild to moderate narrowing of the proximal celiac axis with mild poststenotic dilation 21.4 cm. Questionable beaded appearance of the proximal celiac artery in the region of narrowing. Differential considerations include fibromuscular dysplasia versus median arcuate ligament compression. SMA: Patent without evidence of aneurysm, dissection, vasculitis or significant stenosis. Renals: Solitary left kidney. Minimal atherosclerotic plaque without stenosis, dissection, aneurysm or changes of fibromuscular dysplasia. IMA: Patent without evidence of aneurysm, dissection, vasculitis or significant stenosis. Inflow: Patent without evidence of aneurysm, dissection, vasculitis or significant stenosis. Veins: No obvious venous abnormality within the limitations of  this arterial phase study. Review of the MIP images confirms the above findings. NON-VASCULAR Hepatobiliary: Stable  wedge-shaped defect in the posterior periphery of hepatic segment 6 compared to prior imaging from January. No discrete hepatic lesion. Gallbladder is unremarkable. No intra or extrahepatic biliary ductal dilatation. Pancreas: Diffusely edematous appearance of the pancreas, particularly the pancreatic head. No ductal dilatation. Inflammatory stranding surrounds the entirety of the pancreas and extends into the porta hepatis and mesenteric root. No pseudocyst or focal fluid collection. Spleen: Normal in size without focal abnormality. Adrenals/Urinary Tract: Normal adrenal glands. Absent right kidney. Left kidney is normal in caliber without evidence of hydronephrosis or nephrolithiasis. Circumscribed low-attenuation renal lesions consistent with cysts of varying complexity. No change compared to prior imaging. Specifically, the intermediate attenuation cystic structure exophytic from the lateral lower pole of the left kidney demonstrates no significant interval change dating back to July of 2019. On prior MRI evaluation this was found to be most consistent with a complex proteinaceous/hemorrhagic cyst. Stomach/Bowel: 2 large duodenal diverticula are present arising from the D2 and D3 segments. Similar findings were seen previously. Secondary inflammatory changes noted along the gastric antrum and duodenum. Moderately large amount of formed stool in the rectum consistent with constipation. There are a few scattered colonic diverticula. Lymphatic: No suspicious lymphadenopathy. Reproductive: Prostate is unremarkable. Other: Fat containing left inguinal hernia.  No free fluid. Musculoskeletal: No acute fracture or aggressive appearing lytic or blastic osseous lesion. T12 and L1 compression fractures status post cement augmentation. Additional compression fracture at T7. No acute abnormality. Multilevel degenerative changes most significant at L5-S1. Review of the MIP images confirms the above findings. IMPRESSION: 1.  CT findings most consistent with at least moderately severe acute pancreatitis. Diffusely edematous pancreas, particularly the pancreatic head with extensive inflammatory stranding in the peripancreatic soft tissues extending into the porta hepatis and mesenteric root. No evidence of pseudocyst, free fluid or vascular complication at this time. 2. Similar appearance of duodenal diverticula. 3. Additional ancillary findings as above. Electronically Signed   By: Jacqulynn Cadet M.D.   On: 01/23/2021 06:26   US Abdomen Limited RUQ (LIVER/GB)  Result Date: 01/23/2021 CLINICAL DATA:  Right upper quadrant pain. EXAM: ULTRASOUND ABDOMEN LIMITED RIGHT UPPER QUADRANT COMPARISON:  Same day CT abdomen pelvis FINDINGS: Gallbladder: No gallstones or wall thickening visualized. No sonographic Murphy sign noted by sonographer. Common bile duct: Diameter: 5 mm Liver: No focal lesion identified. Within normal limits in parenchymal echogenicity. Portal vein is patent on color Doppler imaging with normal direction of blood flow towards the liver. Other: Trace perihepatic free fluid. IMPRESSION: 1. No visualized cholelithiasis or choledocholithiasis. 2. Trace perihepatic free fluid, likely related to patient's known pancreatitis. Electronically Signed   By: Dahlia Bailiff MD   On: 01/23/2021 11:28      DVT prophylaxis: Lovenox  Code Status: DNR  Family Communication: Discussed with patient's wife at bedside   Consultants:    Procedures:      Objective    Physical Examination:   General-appears in no acute distress Heart-S1-S2, regular, no murmur auscultated Lungs-clear to auscultation bilaterally, no wheezing or crackles auscultated Abdomen-soft, nontender, no organomegaly Extremities-no edema in the lower extremities Neuro-alert, oriented x3, no focal deficit noted  Status is: Inpatient  Dispo: The patient is from: Home              Anticipated d/c is to home              Anticipated d/c  date is: 01/26/2021  Patient currently not stable for discharge  Barrier to discharge-ongoing evaluation for pancreatitis  COVID-19 Labs  No results for input(s): DDIMER, FERRITIN, LDH, CRP in the last 72 hours.  Lab Results  Component Value Date   [place] NEGATIVE 01/23/2021   [place] NEGATIVE 06/03/2020   [place] NEGATIVE 05/12/2020   [place] NEGATIVE 12/01/2019    Microbiology  Recent Results (from the past 240 hour(s))  Resp Panel by RT-PCR (Flu A&B, Covid) Nasopharyngeal Swab     Status: None   Collection Time: 01/23/21  6:42 AM   Specimen: Nasopharyngeal Swab; Nasopharyngeal(NP) swabs in vial transport medium  Result Value Ref Range Status   SARS Coronavirus 2 by RT PCR NEGATIVE NEGATIVE Final    Comment: (NOTE) SARS-CoV-2 target nucleic acids are NOT DETECTED.  The SARS-CoV-2 RNA is generally detectable in upper respiratory specimens during the acute phase of infection. The lowest concentration of SARS-CoV-2 viral copies this assay can detect is 138 copies/mL. A negative result does not preclude SARS-Cov-2 infection and should not be used as the sole basis for treatment or other patient management decisions. A negative result may occur with  improper specimen collection/handling, submission of specimen other than nasopharyngeal swab, presence of viral mutation(s) within the areas targeted by this assay, and inadequate number of viral copies(<138 copies/mL). A negative result must be combined with clinical observations, patient history, and epidemiological information. The expected result is Negative.  Fact Sheet for Patients:  EntrepreneurPulse.com.au  Fact Sheet for Healthcare Providers:  IncredibleEmployment.be  This test is no t yet approved or cleared by the [place] FDA and  has been authorized for detection and/or diagnosis of SARS-CoV-2 by FDA under an Emergency Use Authorization (EUA).  This EUA will remain  in effect (meaning this test can be used) for the duration of the COVID-19 declaration under Section 564(b)(1) of the Act, 21 U.S.C.section 360bbb-3(b)(1), unless the authorization is terminated  or revoked sooner.       Influenza A by PCR NEGATIVE NEGATIVE Final   Influenza B by PCR NEGATIVE NEGATIVE Final    Comment: (NOTE) The Xpert Xpress SARS-CoV-2/FLU/RSV plus assay is intended as an aid in the diagnosis of influenza from Nasopharyngeal swab specimens and should not be used as a sole basis for treatment. Nasal washings and aspirates are unacceptable for Xpert Xpress SARS-CoV-2/FLU/RSV testing.  Fact Sheet for Patients: EntrepreneurPulse.com.au  Fact Sheet for Healthcare Providers: IncredibleEmployment.be  This test is not yet approved or cleared by the [place] FDA and has been authorized for detection and/or diagnosis of SARS-CoV-2 by FDA under an Emergency Use Authorization (EUA). This EUA will remain in effect (meaning this test can be used) for the duration of the COVID-19 declaration under Section 564(b)(1) of the Act, 21 U.S.C. section 360bbb-3(b)(1), unless the authorization is terminated or revoked.  Performed at [facility], [address] [place],  [address]              [facility] If 7PM-7AM, please contact night-coverage at [email or web address], Office  [phone]   01/24/2021, 3:05 PM  LOS: 1 day

## 2021-01-24 NOTE — Plan of Care (Addendum)
At beginning of the shift, pt confused but calm and cooperative. Pt able to drink sips of water but had to take pills crushed in applesauce due to aspiration precautions. Pt became agitated and restless about 300am. Pt stated wanted to go home. Pt wife at bedside and supportive. Pt has had several incontinence episodes and use of urinal at times. Pt removed external catheter. Bladder scan performed and 127ml noted. Prn Morphine adm, effective. Safety measures in place. Will continue to monitor.  Problem: Health Behavior/Discharge Planning: Goal: Ability to manage health-related needs will improve Outcome: Progressing   Problem: Clinical Measurements: Goal: Ability to maintain clinical measurements within normal limits will improve Outcome: Progressing Goal: Will remain free from infection Outcome: Progressing Goal: Diagnostic test results will improve Outcome: Progressing Goal: Respiratory complications will improve Outcome: Progressing Goal: Cardiovascular complication will be avoided Outcome: Progressing   Problem: Activity: Goal: Risk for activity intolerance will decrease Outcome: Progressing   Problem: Nutrition: Goal: Adequate nutrition will be maintained Outcome: Progressing   Problem: Elimination: Goal: Will not experience complications related to bowel motility Outcome: Progressing Goal: Will not experience complications related to urinary retention Outcome: Progressing   Problem: Elimination: Goal: Will not experience complications related to urinary retention Outcome: Progressing   Problem: Pain Managment: Goal: General experience of comfort will improve Outcome: Progressing   Problem: Skin Integrity: Goal: Risk for impaired skin integrity will decrease Outcome: Progressing

## 2021-01-24 NOTE — Evaluation (Signed)
Physical Therapy Evaluation Patient Details Name: Kevin [name] MRN: [id number] DOB: 12-29-1932 Today's Date: 01/24/2021   History of Present Illness  Pt admitted for acute pancreatitis with complaints of substernal chest pain and nausea. History includes Parkinson's disease, macular degeneration, dementia, and HTN  Clinical Impression  Pt is a pleasant 87 year old male who was admitted for acute pancreatitis. Pt performs bed mobility with mod I, transfers with cga, and ambulation with min assist and RW. Pt demonstrates deficits with strength/cognition/safety awareness/endurance. Pt is very HOH and poor historian. Would benefit from skilled PT to address above deficits and promote optimal return to PLOF; recommend transition to STR upon discharge from acute hospitalization.     Follow Up Recommendations SNF    Equipment Recommendations  None recommended by PT    Recommendations for Other Services       Precautions / Restrictions Precautions Precautions: Fall Restrictions Weight Bearing Restrictions: No      Mobility  Bed Mobility Overal bed mobility: Modified Independent             General bed mobility comments: impulsive, however no assist required    Transfers Overall transfer level: Needs assistance Equipment used: Rolling walker (2 wheeled) Transfers: Sit to/from Stand Sit to Stand: Min guard         General transfer comment: needs cues to initiate mobility efforts. Once standing, upright posture noted  Ambulation/Gait Ambulation/Gait assistance: Min assist Gait Distance (Feet): 80 Feet Assistive device: Rolling walker (2 wheeled) Gait Pattern/deviations: Festinating;Trunk flexed;Narrow base of support     General Gait Details: very impulsive with quick speed. Needs constant hands on assist with frequent LOB noted needing min/mod assist to correct. Poor safety awareness. Fatigues quickly  Financial trader Rankin  (Stroke Patients Only)       Balance Overall balance assessment: Needs assistance Sitting-balance support: No upper extremity supported Sitting balance-Leahy Scale: Fair     Standing balance support: Bilateral upper extremity supported Standing balance-Leahy Scale: Poor                               Pertinent Vitals/Pain Pain Assessment: No/denies pain    Home Living Family/patient expects to be discharged to:: Private residence Living Arrangements: Spouse/significant other Available Help at Discharge: Family Type of Home: House           Additional Comments: pt is confused and very HOH. No family in room. Poor historian. From EMR, pt lives in indep living with wife    Prior Function Level of Independence: Independent with assistive device(s)         Comments: pt is poor historian, per chart uses RW at baseline     Hand Dominance        Extremity/Trunk Assessment   Upper Extremity Assessment Upper Extremity Assessment: Overall WFL for tasks assessed    Lower Extremity Assessment Lower Extremity Assessment: Generalized weakness (B LE grossly 4/5 observed during functional activity)       Communication   Communication: HOH  Cognition Arousal/Alertness: Awake/alert Behavior During Therapy: Restless;Impulsive Overall Cognitive Status: No family/caregiver present to determine baseline cognitive functioning                                 General Comments: appears very confused and impulsive  General Comments      Exercises Other Exercises Other Exercises: attempted ther-ex, however pt very fatigued   Assessment/Plan    PT Assessment Patient needs continued PT services  PT Problem List Decreased strength;Decreased activity tolerance;Decreased balance;Decreased mobility;Decreased cognition;Decreased safety awareness       PT Treatment Interventions DME instruction;Gait training;Therapeutic activities;Therapeutic  exercise;Balance training;Cognitive remediation    PT Goals (Current goals can be found in the Care Plan section)  Acute Rehab PT Goals Patient Stated Goal: unable to state PT Goal Formulation: Patient unable to participate in goal setting Time For Goal Achievement: 02/07/21 Potential to Achieve Goals: Fair    Frequency Min 2X/week   Barriers to discharge        Co-evaluation               AM-PAC PT "6 Clicks" Mobility  Outcome Measure Help needed turning from your back to your side while in a flat bed without using bedrails?: None Help needed moving from lying on your back to sitting on the side of a flat bed without using bedrails?: None Help needed moving to and from a bed to a chair (including a wheelchair)?: A Little Help needed standing up from a chair using your arms (e.g., wheelchair or bedside chair)?: A Little Help needed to walk in hospital room?: A Little Help needed climbing 3-5 steps with a railing? : A Lot 6 Click Score: 19    End of Session Equipment Utilized During Treatment: Gait belt Activity Tolerance: Patient tolerated treatment well Patient left: in bed (with sitter) Nurse Communication: Mobility status PT Visit Diagnosis: Difficulty in walking, not elsewhere classified (R26.2);Unsteadiness on feet (R26.81);Muscle weakness (generalized) (M62.81)    Time: 1340-1400 PT Time Calculation (min) (ACUTE ONLY): 20 min   Charges:   PT Evaluation $PT Eval Low Complexity: 1 Low PT Treatments $Gait Training: 8-22 mins        Greggory Stallion, PT, DPT [phone]   Kevin [name] 01/24/2021, 5:02 PM

## 2021-01-25 ENCOUNTER — Inpatient Hospital Stay: Payer: Medicare HMO

## 2021-01-25 DIAGNOSIS — R748 Abnormal levels of other serum enzymes: Secondary | ICD-10-CM

## 2021-01-25 LAB — LIPASE, BLOOD: Lipase: 369 U/L — ABNORMAL HIGH (ref 11–51)

## 2021-01-25 MED ORDER — CARBIDOPA-LEVODOPA 25-100 MG PO TABS
1.5000 | ORAL_TABLET | Freq: Once | ORAL | Status: AC
  Administered 2021-01-25: 1.5 via ORAL
  Filled 2021-01-25: qty 2

## 2021-01-25 MED ORDER — CARBIDOPA-LEVODOPA 25-100 MG PO TABS
1.5000 | ORAL_TABLET | ORAL | Status: DC
  Administered 2021-01-25 – 2021-01-26 (×4): 1.5 via ORAL
  Filled 2021-01-25 (×3): qty 2

## 2021-01-25 MED ORDER — AMLODIPINE BESYLATE 10 MG PO TABS
10.0000 mg | ORAL_TABLET | Freq: Every day | ORAL | Status: DC
  Administered 2021-01-26: 10 mg via ORAL
  Filled 2021-01-25: qty 1

## 2021-01-25 MED ORDER — GADOBUTROL 1 MMOL/ML IV SOLN
6.0000 mL | Freq: Once | INTRAVENOUS | Status: AC | PRN
  Administered 2021-01-25: 6 mL via INTRAVENOUS

## 2021-01-25 MED ORDER — CARBIDOPA-LEVODOPA 25-100 MG PO TABS
1.0000 | ORAL_TABLET | ORAL | Status: DC
  Administered 2021-01-25 – 2021-01-26 (×2): 1 via ORAL
  Filled 2021-01-25 (×2): qty 1

## 2021-01-25 NOTE — Consult Note (Addendum)
Vonda Antigua, MD [address] [place], [place], [place], [address] [id number], [place], [place], [place], [address] Phone: [phone]  Fax: [phone]  Consultation  Referring Provider:     Dr. Darrick Meigs Primary Care Physician:  Baxter Hire, MD Reason for Consultation:   Pancreatitis  Date of Admission:  01/23/2021 Date of Consultation:  01/25/2021         HPI:   Kevin [name] is a 87 y.o. male presents with his wife, who helps provide history, with epigastric pain that started 1 to 2 days ago, associated with nausea, vomiting, with imaging showing acute pancreatitis.  Patient denies any previous episodes of pancreatitis.  No hematemesis.  Pain is sharp, 8/10, no radiation.  Pain is better today.  Patient had one episode of emesis yesterday.  No hematemesis.  Denies any history of alcohol use.  Denies any new medications or over-the-counter supplements.  Denies any change in chronic meds.    Past Medical History:  Diagnosis Date  . Anemia    in past  . Asthma    none in years  . Broken arm    Pt was 19  . 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 Cataract cortical, senile   . Colon polyps   . Dementia ([facility])    Parkinson's, loss of short term memory  . E coli infection 2020   blood stream  . GERD (gastroesophageal reflux disease)   . HOH (hard of hearing)    extremely HOH, wears aids  . Hyperlipidemia   . Hypertension   . Osteoporosis   . Pneumonia    AS A CHILD    Past Surgical History:  Procedure Laterality Date  . BACK SURGERY    . CATARACT EXTRACTION W/PHACO Left 05/16/2020   Procedure: CATARACT EXTRACTION PHACO AND INTRAOCULAR LENS PLACEMENT (IOC) LEFT 7.27 00:39.0 ;  Surgeon: Birder Robson, MD;  Location: [facility];  Service: Ophthalmology;  Laterality: Left;  . CATARACT EXTRACTION W/PHACO Right 06/07/2020   Procedure: CATARACT EXTRACTION PHACO AND INTRAOCULAR LENS PLACEMENT (IOC) RIGHT 11.56 00:54.9;   Surgeon: Birder Robson, MD;  Location: [facility];  Service: Ophthalmology;  Laterality: Right;  . COLONOSCOPY  2001, 2006, 2012  . ESOPHAGOGASTRODUODENOSCOPY  09/11/2000  . FLEXIBLE SIGMOIDOSCOPY  03/02/1997  . KYPHOPLASTY N/A 11/13/2019   Procedure: T12 KYPHOPLASTY;  Surgeon: Hessie Knows, MD;  Location: [facility];  Service: Orthopedics;  Laterality: N/A;  . KYPHOPLASTY N/A 12/03/2019   Procedure: L1 KYPHOPLASTY;  Surgeon: Hessie Knows, MD;  Location: [facility];  Service: Orthopedics;  Laterality: N/A;  . NASAL POLYP EXCISION    . NEPHRECTOMY    . ROBOT ASSISTED LAPAROSCOPIC NEPHRECTOMY Right 11/17/2018   Procedure: ROBOTIC ASSISTED LAPAROSCOPIC NEPHRECTOMY;  Surgeon: Hollice Espy, MD;  Location: [facility];  Service: Urology;  Laterality: Right;    Prior to Admission medications   Medication Sig Start Date End Date Taking? Authorizing Provider  alendronate (FOSAMAX) 70 MG tablet Take 70 mg by mouth once a week. Saturday   Yes [provider]  amLODipine (NORVASC) 5 MG tablet Take 5 mg by mouth daily. 01/22/18  Yes [provider]  carbidopa-levodopa (SINEMET IR) 25-100 MG tablet Take by mouth See admin instructions. Take 1.5 tablet (0800) by mouth in the morning, then 1.5 tablet (1200) by mouth at noon, then 1.5 tablet by mouth at 5 pm (1700), then 1 tablet by mouth at bedtime (2200), then 1 tablet at  0300. 06/15/19  Yes [provider]  escitalopram (LEXAPRO) 10 MG tablet Take 10 mg by mouth daily.   Yes [provider]  Multiple Vitamins-Minerals (PRESERVISION AREDS 2 PO) Take 1 tablet by mouth daily.   Yes [provider]  rivastigmine (EXELON) 3 MG capsule Take 3 mg by mouth 2 (two) times daily.   Yes [provider]  simvastatin (ZOCOR) 10 MG tablet Take 10 mg by mouth every evening.  01/22/18  Yes [provider]  vitamin B-12 (CYANOCOBALAMIN) 1000 MCG tablet Take 1 tablet (1,000 mcg total) by mouth daily. 10/17/20   Yes Earlie Server, MD    Family History  Problem Relation Age of Onset  . Heart disease Mother   . Pancreatitis Mother   . Emphysema Father   . Breast cancer Sister   . Breast cancer Sister      Social History   Tobacco Use  . Smoking status: Never Smoker  . Smokeless tobacco: Never Used  Vaping Use  . Vaping Use: Never used  Substance Use Topics  . Alcohol use: Never  . Drug use: Never    Allergies as of 01/23/2021  . (No Known Allergies)    Review of Systems:    All systems reviewed and negative except where noted in HPI.   Physical Exam:  Vital signs in last 24 hours: Vitals:   01/24/21 2359 01/25/21 0417 01/25/21 0530 01/25/21 0754  BP: (!) 161/103 (!) 183/100 (!) 156/99 (!) 164/106  Pulse: 79 86 79 88  Resp: _0 Temp: 98.2 F (36.8 C) 98 F (36.7 C)  98.2 F (36.8 C)  TempSrc:  Oral  Oral  SpO2: 98% 94%  96%  Weight:      Height:         General:   Pleasant, cooperative in NAD Head:  Normocephalic and atraumatic. Eyes:   No icterus.   Conjunctiva pink. PERRLA. Ears:  Normal auditory acuity. Neck:  Supple; no masses or thyroidomegaly Lungs: Respirations even and unlabored. Lungs clear to auscultation bilaterally.   No wheezes, crackles, or rhonchi.  Abdomen:  Soft, nondistended, nontender. Normal bowel sounds. No appreciable masses or hepatomegaly.  No rebound or guarding.  Neurologic:  Alert;  grossly normal neurologically. Skin:  Intact without significant lesions or rashes. Cervical Nodes:  No significant cervical adenopathy. Psych:  Alert and cooperative. Normal affect.  LAB RESULTS: Recent Labs    01/23/21 0305 01/24/21 0501  WBC 6.0 10.5  HGB 12.2* 11.7*  HCT 37.0* 34.7*  PLT 139* 123*   BMET Recent Labs    01/23/21 0305 01/24/21 0501  NA 137 136  K 3.1* 4.0  CL 104 107  CO2 26 23  GLUCOSE 96 77  BUN 28* 29*  CREATININE 1.08 0.93  CALCIUM 8.8* 7.8*   LFT Recent Labs    01/24/21 0501  PROT 5.4*  ALBUMIN 3.0*  AST  133*  ALT 55*  ALKPHOS 98  BILITOT 1.0  BILIDIR 0.3*  IBILI 0.7   PT/INR No results for input(s): LABPROT, INR in the last 72 hours.  STUDIES: US Abdomen Limited RUQ (LIVER/GB)  Result Date: 01/23/2021 CLINICAL DATA:  Right upper quadrant pain. EXAM: ULTRASOUND ABDOMEN LIMITED RIGHT UPPER QUADRANT COMPARISON:  Same day CT abdomen pelvis FINDINGS: Gallbladder: No gallstones or wall thickening visualized. No sonographic Murphy sign noted by sonographer. Common bile duct: Diameter: 5 mm Liver: No focal lesion identified. Within normal limits in parenchymal echogenicity. Portal vein is patent on  color Doppler imaging with normal direction of blood flow towards the liver. Other: Trace perihepatic free fluid. IMPRESSION: 1. No visualized cholelithiasis or choledocholithiasis. 2. Trace perihepatic free fluid, likely related to patient's known pancreatitis. Electronically Signed   By: Dahlia Bailiff MD   On: 01/23/2021 11:28      Impression / Plan:   BLAYZE HAEN is a 87 y.o. y/o male with epigastric pain, with elevated lipase, pancreatitis on imaging  Patient was found to have acute elevation in transaminases, but alk phos and total bilirubin has been normal, with very mild elevation in direct bilirubin.  Patient possible passed stone as the etiology  Small CBD stones can be missed on ultrasound or CT may be better visible on MRCP.  Therefore I have recommended MRCP, with results pending at this time.  Lab work is not entirely consistent with biliary obstruction given normal alk phos and bilirubin  Triglycerides are normal  We will obtain IgG4 to evaluate for autoimmune pancreatitis  Looking at his medication list, simvastatin is considered a class I a drug, and alendronate a class III drug or drug induced pancreatitis.  Class I drugs being more likely to cause pancreatitis than class III.  However, it is difficult to prove causation, especially since patient has been on these medications  for a while  If no other clear etiology of pancreatitis is evident on MRCP, would recommend considering alternatives for simvastatin if possible  For pancreatitis, would recommend pain control and IV fluids as necessary.  Pain is improving at this time, and diet can be advanced as tolerated, starting clear liquid diet and advance to low-fat diet as tolerated  Thank you for involving me in the care of this patient.      LOS: 2 days   Kevin Manifold, MD  01/25/2021, 8:31 AM

## 2021-01-25 NOTE — Progress Notes (Signed)
MRCP report reviewed and reports a large duodenal diverticulum, which makes biliary duct assessment in the pancreatic portion of the CBD, difficult.  Given this finding, question still remains if his pancreatitis was caused from passed CBD stones.  Ideally, EUS would allow for evaluation of the bile duct, to evaluate if any evidence of choledocholithiasis is present.  However, risks and benefits of procedure need to be considered, especially in this elderly 87 year old gentleman.  He does not have any evidence of cholangitis  Normal bilirubin and alk phos suggests no biliary obstruction is present at this time, and subjecting him to endoscopic procedures in that setting would thus have higher risks than benefits, as etiology at this time is likely passed stones.  In some cases, when clinical picture does not match labs, such as not improving abdominal pain, ERCP can be considered if suspicion for choledocholithiasis remains high.  Therefore, I did discuss the patient, with interventional GI, Dr. Allen Norris who reviewed the chart, and given the risks of causing pancreatitis with an ERCP he is not recommending ERCP at this time.  Given that patient's abdominal pain is improving, normal bili and alk phos, improving lipase, the safest course for the patient at this time is conservative management  Follow-up pending CMP today and pending IgG4 level.  If CMP continues to show normal bilirubin and alk phos, endoscopic procedures at this time would have higher risks than benefits in the setting of acute inflammation from pancreatitis.  However, if clinical status changes, patient has continued abdominal pain that is not improving as expected, labs are not improving or worsening, plan for EUS or ERCP can be reassessed.  If labs worsen, and EUS is considered necessary, patient will need to be transferred as EUS capabilities are not available here.  Would also recommend surgery consult given working diagnosis is  likely passed stones causing his pancreatitis, and to discuss possible cholecystectomy to prevent future episodes.

## 2021-01-25 NOTE — Clinical Social Work Note (Signed)
RE: Kevin [name] Date of Birth: 10-10-1933 Date: 01/25/2021   To Whom It May Concern:  Please be advised that the above-named patient will require a short-term nursing home stay - anticipated 30 days or less for rehabilitation and strengthening.  The plan is for return home.

## 2021-01-25 NOTE — TOC Initial Note (Signed)
Transition of Care [facility]) - Initial/Assessment Note    Patient Details  Name: Kevin [name] MRN: [id number] Date of Birth: 09-18-1933  Transition of Care [facility]) CM/SW Contact:    Candie Chroman, LCSW Phone Number: 01/25/2021, 2:56 PM  Clinical Narrative: Patient only oriented to self. Wife at bedside. CSW introduced role and explained that PT recommendations would be discussed. Patient and his wife live at [place] and wife is agreeable to him going to SNF side [facility]). Admissions coordinator is aware. PASARR under manual review. Uploaded requested documents into [facility]. SNF unable to start auth today so CSW faxed clinicals to [facility]. No further concerns. CSW encouraged patient's wife to contact CSW as needed. CSW will continue to follow patient for support and facilitate discharge to SNF once medically stable.                 Expected Discharge Plan: Skilled Nursing Facility Barriers to Discharge: Insurance Authorization,Continued Medical Work up,Requiring sitter/restraints   Patient Goals and CMS Choice     Choice offered to / list presented to : NA  Expected Discharge Plan and Services Expected Discharge Plan: [place] Choice: [place] Living arrangements for the past 2 months: [place]                                      Prior Living Arrangements/Services Living arrangements for the past 2 months: [place] Lives with:: Spouse Patient language and need for interpreter reviewed:: Yes Do you feel safe going back to the place where you live?: Yes      Need for Family Participation in Patient Care: Yes (Comment) Care giver support system in place?: Yes (comment)   Criminal Activity/Legal Involvement Pertinent to Current Situation/Hospitalization: No - Comment as needed  Activities of Daily Living Home Assistive Devices/Equipment: Walker (specify  type) (rolling) ADL Screening (condition at time of admission) Patient's cognitive ability adequate to safely complete daily activities?: Yes Is the patient deaf or have difficulty hearing?: Yes Does the patient have difficulty seeing, even when wearing glasses/contacts?: No Does the patient have difficulty concentrating, remembering, or making decisions?: Yes Patient able to express need for assistance with ADLs?: No Does the patient have difficulty dressing or bathing?: No Independently performs ADLs?: Yes (appropriate for developmental age) Does the patient have difficulty walking or climbing stairs?: Yes Weakness of Legs: Both Weakness of Arms/Hands: None  Permission Sought/Granted Permission sought to share information with : Facility Contact Representative,Family Supports    Share Information with NAME: Estel Scholze  Permission granted to share info w AGENCY: [place] granted to share info w Relationship: Wife  Permission granted to share info w Contact Information: [phone]  Emotional Assessment Appearance:: Appears stated age Attitude/Demeanor/Rapport: Unable to Assess Affect (typically observed): Unable to Assess Orientation: : Oriented to Self Alcohol / Substance Use: Not Applicable Psych Involvement: No (comment)  Admission diagnosis:  Acute pancreatitis [K85.90] Idiopathic acute pancreatitis without infection or necrosis [K85.00] Patient Active Problem List   Diagnosis Date Noted  . Acute pancreatitis 01/23/2021  . Hypokalemia 01/23/2021  . Dementia ([facility])   . GERD (gastroesophageal reflux disease)   . Hypertension   . 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[place])   . Anemia 10/17/2020  . Leukopenia 10/17/2020  . Thrombocytopenia ([place]) 10/17/2020  .  CAP (community acquired pneumonia) 03/04/2019  . Right renal mass 11/17/2018   PCP:  Baxter Hire, MD Pharmacy:   [id number] - [place], [place] [address] Phone: [phone] Fax: [phone]     Social Determinants of Health (SDOH) Interventions    Readmission Risk Interventions No flowsheet data found.

## 2021-01-25 NOTE — Progress Notes (Signed)
PROGRESS NOTE    Kevin [name]  PXT:[id number] DOB: 1933-06-27 DOA: 01/23/2021 PCP: Baxter Hire, MD     Brief Narrative:  Kevin [name] is an 87 year old male with history of Parkinson disease, macular degeneration, dementia, hypertension was brought into the ED by EMS for evaluation of sudden onset of epigastric/substernal chest pain which started at 2 AM associated with nausea and multiple episodes of vomiting.  In the ED CT findings were consistent with moderately severe acute pancreatitis.  Showed diffusely edematous pancreas particularly the pancreatic head with extensive inflammatory stranding in the peripancreatic soft tissue extending into the porta hepatis and mesenteric root. Lipase was elevated to 3,721.  Patient was admitted for acute pancreatitis.  New events last 24 hours / Subjective: Patient remains very confused this morning, has been trying to get out of bed.  He denies any abdominal pain, admits to hunger.  He remains a very poor historian, alert and oriented to self only.  Assessment & Plan:   Principal Problem:   Acute pancreatitis Active Problems:   Hypokalemia   Dementia ([facility])   GERD (gastroesophageal reflux disease)   Hypertension   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[facility])   Acute pancreatitis -CTA C/A/P: CT findings most consistent with at least moderately severe acute pancreatitis. Diffusely edematous pancreas, particularly the pancreatic head with extensive inflammatory stranding in the peripancreatic soft tissues extending into the porta hepatis and mesenteric root. No evidence of pseudocyst, free fluid or vascular complication at this time. -RUQ [place]: No visualized cholelithiasis or choledocholithiasis -Triglycerides 38 -MRCP recommended by GI -GI consulted -Continue supportive care, IVF   Dementia due to Parkinson's disease -Continue Sinemet, Lexapro, Exelon  Hypertension -Continue amlodipine    DVT prophylaxis:   enoxaparin (LOVENOX) injection 40 mg Start: 01/23/21 2200 SCDs Start: 01/23/21 0858  Code Status: DNR Family Communication: Called wife with no answer, left voicemail for her to call us back  Disposition Plan:  Status is: Inpatient  Remains inpatient appropriate because:Ongoing diagnostic testing needed not appropriate for outpatient work up, IV treatments appropriate due to intensity of illness or inability to take PO and Inpatient level of care appropriate due to severity of illness   Dispo: The patient is from: Home              Anticipated d/c is to: SNF              Patient currently is not medically stable to d/c.  GI consulted, MRCP pending.  Remains on IV fluid.   Difficult to place patient No      Consultants:   GI   Antimicrobials:  Anti-infectives (From admission, onward)   None        Objective: Vitals:   01/25/21 0417 01/25/21 0530 01/25/21 0754 01/25/21 1156  BP: (!) 183/100 (!) 156/99 (!) 164/106 (!) 182/100  Pulse: 86 79 88 88  Resp: 20  20 18   Temp: 98 F (36.7 C)  98.2 F (36.8 C) 98.4 F (36.9 C)  TempSrc: Oral  Oral Oral  SpO2: 94%  96% 98%  Weight:      Height:        Intake/Output Summary (Last 24 hours) at 01/25/2021 1335 Last data filed at 01/25/2021 0400 Gross per 24 hour  Intake 2985.03 ml  Output 1325 ml  Net 1660.03 ml   Filed Weights   01/23/21 0259  Weight: 68 kg    Examination:  General exam: Appears calm and comfortable  Respiratory system:  Clear to auscultation. Respiratory effort normal. No respiratory distress. No conversational dyspnea.  Cardiovascular system: S1 & S2 heard, RRR. No murmurs. No pedal edema. Gastrointestinal system: Abdomen is nondistended, soft and nontender. Normal bowel sounds heard.  Central nervous system: Alert but not oriented to place or time Extremities: Symmetric in appearance  Skin: No rashes, lesions or ulcers on exposed skin  Psychiatry: Remains confused, poor historian, history of  Parkinson's dementia   Data Reviewed: I have personally reviewed following labs and imaging studies  CBC: Recent Labs  Lab 01/23/21 0305 01/24/21 0501  WBC 6.0 10.5  NEUTROABS 5.3  --   HGB 12.2* 11.7*  HCT 37.0* 34.7*  MCV 92.0 90.8  PLT 139* 893*   Basic Metabolic Panel: Recent Labs  Lab 01/23/21 0305 01/24/21 0501  NA 137 136  K 3.1* 4.0  CL 104 107  CO2 26 23  GLUCOSE 96 77  BUN 28* 29*  CREATININE 1.08 0.93  CALCIUM 8.8* 7.8*   GFR: Estimated Creatinine Clearance: 54.8 mL/min (by C-G formula based on SCr of 0.93 mg/dL). Liver Function Tests: Recent Labs  Lab 01/23/21 0305 01/24/21 0501  AST 350* 133*  ALT 17 55*  ALKPHOS 108 98  BILITOT 1.2 1.0  PROT 6.5 5.4*  ALBUMIN 3.9 3.0*   Recent Labs  Lab 01/23/21 0305 01/25/21 0558  LIPASE 3,721* 369*   No results for input(s): AMMONIA in the last 168 hours. Coagulation Profile: No results for input(s): INR, PROTIME in the last 168 hours. Cardiac Enzymes: No results for input(s): CKTOTAL, CKMB, CKMBINDEX, TROPONINI in the last 168 hours. BNP (last 3 results) No results for input(s): PROBNP in the last 8760 hours. HbA1C: No results for input(s): HGBA1C in the last 72 hours. CBG: No results for input(s): GLUCAP in the last 168 hours. Lipid Profile: Recent Labs    01/24/21 0501  CHOL 102  HDL 50  LDLCALC 44  TRIG 38  CHOLHDL 2.0   Thyroid Function Tests: No results for input(s): TSH, T4TOTAL, FREET4, T3FREE, THYROIDAB in the last 72 hours. Anemia Panel: No results for input(s): VITAMINB12, FOLATE, FERRITIN, TIBC, IRON, RETICCTPCT in the last 72 hours. Sepsis Labs: No results for input(s): PROCALCITON, LATICACIDVEN in the last 168 hours.  Recent Results (from the past 240 hour(s))  Resp Panel by RT-PCR (Flu A&B, Covid) Nasopharyngeal Swab     Status: None   Collection Time: 01/23/21  6:42 AM   Specimen: Nasopharyngeal Swab; Nasopharyngeal(NP) swabs in vial transport medium  Result Value Ref  Range Status   SARS Coronavirus 2 by RT PCR NEGATIVE NEGATIVE Final    Comment: (NOTE) SARS-CoV-2 target nucleic acids are NOT DETECTED.  The SARS-CoV-2 RNA is generally detectable in upper respiratory specimens during the acute phase of infection. The lowest concentration of SARS-CoV-2 viral copies this assay can detect is 138 copies/mL. A negative result does not preclude SARS-Cov-2 infection and should not be used as the sole basis for treatment or other patient management decisions. A negative result may occur with  improper specimen collection/handling, submission of specimen other than nasopharyngeal swab, presence of viral mutation(s) within the areas targeted by this assay, and inadequate number of viral copies(<138 copies/mL). A negative result must be combined with clinical observations, patient history, and epidemiological information. The expected result is Negative.  Fact Sheet for Patients:  EntrepreneurPulse.com.au  Fact Sheet for Healthcare Providers:  IncredibleEmployment.be  This test is no t yet approved or cleared by the [place] and  has been authorized  for detection and/or diagnosis of SARS-CoV-2 by FDA under an Emergency Use Authorization (EUA). This EUA will remain  in effect (meaning this test can be used) for the duration of the COVID-19 declaration under Section 564(b)(1) of the Act, 21 U.S.C.section 360bbb-3(b)(1), unless the authorization is terminated  or revoked sooner.       Influenza A by PCR NEGATIVE NEGATIVE Final   Influenza B by PCR NEGATIVE NEGATIVE Final    Comment: (NOTE) The Xpert Xpress SARS-CoV-2/FLU/RSV plus assay is intended as an aid in the diagnosis of influenza from Nasopharyngeal swab specimens and should not be used as a sole basis for treatment. Nasal washings and aspirates are unacceptable for Xpert Xpress SARS-CoV-2/FLU/RSV testing.  Fact Sheet for  Patients: EntrepreneurPulse.com.au  Fact Sheet for Healthcare Providers: IncredibleEmployment.be  This test is not yet approved or cleared by the [place] FDA and has been authorized for detection and/or diagnosis of SARS-CoV-2 by FDA under an Emergency Use Authorization (EUA). This EUA will remain in effect (meaning this test can be used) for the duration of the COVID-19 declaration under Section 564(b)(1) of the Act, 21 U.S.C. section 360bbb-3(b)(1), unless the authorization is terminated or revoked.  Performed at [facility], [address] [place], [place] [address]       Radiology Studies: No results found.    Scheduled Meds: . amLODipine  5 mg Oral Daily  . carbidopa-levodopa  1.5 tablet Oral 3 times per day   And  . carbidopa-levodopa  1 tablet Oral 2 times per day  . enoxaparin (LOVENOX) injection  40 mg Subcutaneous Q24H  . escitalopram  10 mg Oral Daily  .  morphine injection  4 mg Intravenous Once  . multivitamin-lutein  1 capsule Oral Daily  . nystatin cream  1 application Topical BID  . pantoprazole (PROTONIX) IV  40 mg Intravenous Q24H  . psyllium  1 packet Oral Q1200  . rivastigmine  3 mg Oral BID  . vitamin B-12  1,000 mcg Oral Daily   Continuous Infusions: . lactated ringers 125 mL/hr at 01/25/21 0305     LOS: 2 days      Time spent: 30 minutes   Dessa Phi, DO [facility] 01/25/2021, 1:35 PM   Available via Epic secure chat 7am-7pm After these hours, please refer to coverage provider listed on [email or web address]

## 2021-01-25 NOTE — Progress Notes (Signed)
Blood pressure elevated - 185/105 , HR-85. Labetalol IV ordered per Oncall provider and given.

## 2021-01-25 NOTE — NC FL2 (Signed)
[place] LEVEL OF CARE SCREENING TOOL     IDENTIFICATION  Patient Name: Kevin [name] Birthdate: [birth year] Sex: male Admission Date (Current Location): 01/23/2021  [facility] and [place] Number:  Engineering geologist and Address:  [facility], [address], [place], [place] [address]      Provider Number: [id number]  Attending Physician Name and Address:  Dessa Phi, DO  Relative Name and Phone Number:       Current Level of Care: Hospital Recommended Level of Care: [place] Prior Approval Number:    Date Approved/Denied:   PASRR Number: Manual review  Discharge Plan: SNF    Current Diagnoses: Patient Active Problem List   Diagnosis Date Noted  . Acute pancreatitis 01/23/2021  . Hypokalemia 01/23/2021  . Dementia ([place])   . GERD (gastroesophageal reflux disease)   . Hypertension   . 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[place])   . Anemia 10/17/2020  . Leukopenia 10/17/2020  . Thrombocytopenia ([place]) 10/17/2020  . CAP (community acquired pneumonia) 03/04/2019  . Right renal mass 11/17/2018    Orientation RESPIRATION BLADDER Height & Weight     Self  Normal Incontinent,External catheter Weight: 150 lb (68 kg) Height:  6' (182.9 cm)  BEHAVIORAL SYMPTOMS/MOOD NEUROLOGICAL BOWEL NUTRITION STATUS  Other (Comment) (Getting up out of bed, pulling lines)  (Dementia, Parkinsons) Incontinent Diet (Currently NPO. Follow for discharge diet recommendations.)  AMBULATORY STATUS COMMUNICATION OF NEEDS Skin   Limited Assist Verbally Normal                       Personal Care Assistance Level of Assistance  Bathing,Feeding,Dressing Bathing Assistance: Limited assistance Feeding assistance: Limited assistance Dressing Assistance: Limited assistance     Functional Limitations Info  Sight,Hearing,Speech Sight Info: Adequate Hearing Info: Adequate Speech Info: Adequate    SPECIAL  CARE FACTORS FREQUENCY  PT (By licensed PT),OT (By licensed OT)     PT Frequency: 5 x week OT Frequency: 5 x week            Contractures Contractures Info: Not present    Additional Factors Info  Code Status,Allergies Code Status Info: DNR Allergies Info: NKDA           Current Medications (01/25/2021):  This is the current hospital active medication list Current Facility-Administered Medications  Medication Dose Route Frequency Provider Last Rate Last Admin  . amLODipine (NORVASC) tablet 5 mg  5 mg Oral Daily Agbata, Tochukwu, MD   5 mg at 01/25/21 1136  . carbidopa-levodopa (SINEMET IR) 25-100 MG per tablet immediate release 1.5 tablet  1.5 tablet Oral 3 times per day Renda Rolls, RPH   1.5 tablet at 01/25/21 1136   And  . carbidopa-levodopa (SINEMET IR) 25-100 MG per tablet immediate release 1 tablet  1 tablet Oral 2 times per day Renda Rolls, RPH      . enoxaparin (LOVENOX) injection 40 mg  40 mg Subcutaneous Q24H Agbata, Tochukwu, MD   40 mg at 01/24/21 2103  . escitalopram (LEXAPRO) tablet 10 mg  10 mg Oral Daily Agbata, Tochukwu, MD   10 mg at 01/25/21 1135  . labetalol (NORMODYNE) injection 5 mg  5 mg Intravenous Q6H PRN Athena Masse, MD   5 mg at 01/25/21 0424  . lactated ringers infusion   Intravenous Continuous Oswald Hillock, MD 125 mL/hr at 01/25/21 0305 Infusion Verify at 01/25/21 0305  . morphine 2 MG/ML injection  2 mg  2 mg Intravenous Q4H PRN Agbata, Tochukwu, MD   2 mg at 01/24/21 0306  . morphine 4 MG/ML injection 4 mg  4 mg Intravenous Once Ward, Kristen N, DO      . multivitamin-lutein (OCUVITE-LUTEIN) capsule 1 capsule  1 capsule Oral Daily Agbata, Tochukwu, MD   1 capsule at 01/25/21 1135  . nystatin cream (MYCOSTATIN) 1 application  1 application Topical BID Agbata, Tochukwu, MD   1 application at 88/82/80 1138  . ondansetron (ZOFRAN) tablet 4 mg  4 mg Oral Q6H PRN Agbata, Tochukwu, MD       Or  . ondansetron (ZOFRAN) injection 4 mg  4 mg  Intravenous Q6H PRN Agbata, Tochukwu, MD      . pantoprazole (PROTONIX) injection 40 mg  40 mg Intravenous Q24H Agbata, Tochukwu, MD   40 mg at 01/25/21 1135  . psyllium (HYDROCIL/METAMUCIL) 1 packet  1 packet Oral Q1200 Agbata, Tochukwu, MD      . rivastigmine (EXELON) capsule 3 mg  3 mg Oral BID Agbata, Tochukwu, MD   3 mg at 01/25/21 1135  . vitamin B-12 (CYANOCOBALAMIN) tablet 1,000 mcg  1,000 mcg Oral Daily Agbata, Tochukwu, MD   1,000 mcg at 01/25/21 1135     Discharge Medications: Please see discharge summary for a list of discharge medications.  Relevant Imaging Results:  Relevant Lab Results:   Additional Information SS#: [id number]  Candie Chroman, LCSW

## 2021-01-26 ENCOUNTER — Encounter
Admission: RE | Admit: 2021-01-26 | Discharge: 2021-01-26 | Disposition: A | Discharge status: Home / Self Care | Payer: Medicare HMO | Source: Ambulatory Visit | Attending: Internal Medicine | Admitting: Internal Medicine

## 2021-01-26 LAB — COMPREHENSIVE METABOLIC PANEL
ALT: 11 U/L (ref 0–44)
AST: 27 U/L (ref 15–41)
Albumin: 3.1 g/dL — ABNORMAL LOW (ref 3.5–5.0)
Alkaline Phosphatase: 85 U/L (ref 38–126)
Anion gap: 9 (ref 5–15)
BUN: 12 mg/dL (ref 8–23)
CO2: 23 mmol/L (ref 22–32)
Calcium: 8.2 mg/dL — ABNORMAL LOW (ref 8.9–10.3)
Chloride: 106 mmol/L (ref 98–111)
Creatinine, Ser: 0.73 mg/dL (ref 0.61–1.24)
GFR, Estimated: >60 mL/min (ref >60)
Glucose, Bld: 86 mg/dL (ref 70–99)
Potassium: 3.2 mmol/L — ABNORMAL LOW (ref 3.5–5.1)
Sodium: 138 mmol/L (ref 135–145)
Total Bilirubin: 1 mg/dL (ref 0.3–1.2)
Total Protein: 5.8 g/dL — ABNORMAL LOW (ref 6.5–8.1)

## 2021-01-26 LAB — IGG 4: IgG, Subclass 4: 36 mg/dL (ref 2–96)

## 2021-01-26 MED ORDER — AMLODIPINE BESYLATE 5 MG PO TABS
10.0000 mg | ORAL_TABLET | Freq: Every day | ORAL | 1 refills | Status: DC
Start: 2021-01-26 — End: 2021-12-01

## 2021-01-26 MED ORDER — ATORVASTATIN CALCIUM 20 MG PO TABS: 20.0000 mg | ORAL_TABLET | Freq: Every day | ORAL | 2 refills | Status: DC

## 2021-01-26 MED ORDER — POTASSIUM CHLORIDE CRYS ER 20 MEQ PO TBCR
40.0000 meq | EXTENDED_RELEASE_TABLET | ORAL | Status: AC
  Administered 2021-01-26 (×2): 40 meq via ORAL
  Filled 2021-01-26 (×2): qty 2

## 2021-01-26 NOTE — Discharge Instructions (Signed)
Acute Pancreatitis  Acute pancreatitis happens when the pancreas gets swollen. The pancreas is a large gland in the body that helps to control blood sugar. It also makes enzymes that help to digest food. This condition can last a few days and cause serious problems. The lungs, heart, and kidneys may stop working. What are the causes? Causes include:  Alcohol abuse.  Drug abuse.  Gallstones.  A tumor in the pancreas. Other causes include:  Some medicines.  Some chemicals.  Diabetes.  An infection.  Damage caused by an accident.  The poison (venom) from a scorpion bite.  Belly (abdominal) surgery.  The body's defense system (immune system) attacking the pancreas (autoimmune pancreatitis).  Genes that are passed from parent to child (inherited). In some cases, the cause is not known. What are the signs or symptoms?  Pain in the upper belly that may be felt in the back. The pain may be very bad.  Swelling of the belly.  Feeling sick to your stomach (nauseous) and throwing up (vomiting).  Fever. How is this treated? You will likely have to stay in the hospital. Treatment may include:  Pain medicine.  Fluid through an IV tube.  Placing a tube in the stomach to take out the stomach contents. This may help you stop throwing up.  Not eating for 3-4 days.  Antibiotic medicines, if you have an infection.  Treating any other problems that may be the cause.  Steroid medicines, if your problem is caused by your defense system attacking your body's own tissues.  Surgery. Follow these instructions at home: Eating and drinking  Follow instructions from your doctor about what to eat and drink.  Eat foods that do not have a lot of fat in them.  Eat small meals often. Do not eat big meals.  Drink enough fluid to keep your pee (urine) pale yellow.  Do not drink alcohol if it caused your condition.   Medicines  Take over-the-counter and prescription medicines only  as told by your doctor.  Ask your doctor if the medicine prescribed to you: ? Requires you to avoid driving or using heavy machinery. ? Can cause trouble pooping (constipation). You may need to take steps to prevent or treat trouble pooping:  Take over-the-counter or prescription medicines.  Eat foods that are high in fiber. These include beans, whole grains, and fresh fruits and vegetables.  Limit foods that are high in fat and sugar. These include fried or sweet foods. General instructions  Do not use any products that contain nicotine or tobacco, such as cigarettes, e-cigarettes, and chewing tobacco. If you need help quitting, ask your doctor.  Get plenty of rest.  Check your blood sugar at home as told by your doctor.  Keep all follow-up visits as told by your doctor. This is important. Contact a doctor if:  You do not get better as quickly as expected.  You have new symptoms.  Your symptoms get worse.  You have pain or weakness that lasts a long time.  You keep feeling sick to your stomach.  You get better and then you have pain again.  You have a fever. Get help right away if:  You cannot eat or keep fluids down.  Your pain gets very bad.  Your skin or the white part of your eyes turns yellow.  You have sudden swelling in your belly.  You throw up.  You feel dizzy or you pass out (faint).  Your blood sugar is high (over   300 mg/dL). Summary  Acute pancreatitis happens when the pancreas gets swollen.  This condition is often caused by alcohol abuse, drug abuse, or gallstones.  You will likely have to stay in the hospital for treatment. This information is not intended to replace advice given to you by your health care provider. Make sure you discuss any questions you have with your health care provider. Document Revised: 07/28/2018 Document Reviewed: 07/28/2018 Elsevier Patient Education  2021 Elsevier Inc.  

## 2021-01-26 NOTE — Consult Note (Signed)
[facility] SURGICAL CONSULTATION NOTE (initial) - cpt: 80881   HISTORY OF PRESENT ILLNESS (HPI):  87 y.o. male presented to [facility] ED on 04/04 for evaluation of chest pain and emesis. Patient's wife reports that he awoke from sleep the night of presentation and told he "he thought he was having a heart attack." At that time, he was reporting chest pain, epigastric pain radiating to his back, and nausea/emesis. He does not provide much additional history and is a very limited historian. He does have a history of Parkinson's and dementia, but his wife states he is relatively functional at baseline. No history of alcohol abuse. Previous abdominal surgeries is positive for robotic assisted laparoscopic right nephrectomy with Dr Erlene Quan in January 2020 for renal cell carcinoma. In the ED, work up revealed a lipase to 3700 but was without leukocytosis, bilirubin levels were normal, and he was maintaining good renal function. He did undergo CTA Chest/Abdomen/Pelvis which was concerning for acute pancreatitis. Follow up RUQ [place] was without cholelithiasis nor choledocholithiasis. He was ultimately admitted to medicine service. Gastroenterology was consulted and he did undergo MRCP which was negative for CBD stone.   This morning, he, and his wife, report that he has been relatively pain free for more than 24 hours. No fever, chills, nausea, emesis. He is tolerating CLD without issues.   Surgery is consulted by hospitalist physician Dr. Dessa Phi, DO in this context for evaluation and management of pancreatitis and rule out biliary etiology.    PAST MEDICAL HISTORY (PMH):  Past Medical History:  Diagnosis Date  . Anemia    in past  . Asthma    none in years  . Broken arm    Pt was 19  . 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 Cataract cortical, senile   . Colon polyps   . Dementia ([place])    Parkinson's, loss of short term memory  . E coli infection  2020   blood stream  . GERD (gastroesophageal reflux disease)   . HOH (hard of hearing)    extremely HOH, wears aids  . Hyperlipidemia   . Hypertension   . Osteoporosis   . Pneumonia    AS A CHILD     PAST SURGICAL HISTORY ([place]):  Past Surgical History:  Procedure Laterality Date  . BACK SURGERY    . CATARACT EXTRACTION W/PHACO Left 05/16/2020   Procedure: CATARACT EXTRACTION PHACO AND INTRAOCULAR LENS PLACEMENT (IOC) LEFT 7.27 00:39.0 ;  Surgeon: Birder Robson, MD;  Location: [facility];  Service: Ophthalmology;  Laterality: Left;  . CATARACT EXTRACTION W/PHACO Right 06/07/2020   Procedure: CATARACT EXTRACTION PHACO AND INTRAOCULAR LENS PLACEMENT (IOC) RIGHT 11.56 00:54.9;  Surgeon: Birder Robson, MD;  Location: [facility];  Service: Ophthalmology;  Laterality: Right;  . COLONOSCOPY  2001, 2006, 2012  . ESOPHAGOGASTRODUODENOSCOPY  09/11/2000  . FLEXIBLE SIGMOIDOSCOPY  03/02/1997  . KYPHOPLASTY N/A 11/13/2019   Procedure: T12 KYPHOPLASTY;  Surgeon: Hessie Knows, MD;  Location: [facility];  Service: Orthopedics;  Laterality: N/A;  . KYPHOPLASTY N/A 12/03/2019   Procedure: L1 KYPHOPLASTY;  Surgeon: Hessie Knows, MD;  Location: [facility];  Service: Orthopedics;  Laterality: N/A;  . NASAL POLYP EXCISION    . NEPHRECTOMY    . ROBOT ASSISTED LAPAROSCOPIC NEPHRECTOMY Right 11/17/2018   Procedure: ROBOTIC ASSISTED LAPAROSCOPIC NEPHRECTOMY;  Surgeon: Hollice Espy, MD;  Location: [facility];  Service: Urology;  Laterality: Right;     MEDICATIONS:  Prior  to Admission medications   Medication Sig Start Date End Date Taking? Authorizing Provider  alendronate (FOSAMAX) 70 MG tablet Take 70 mg by mouth once a week. Saturday   Yes [provider]  amLODipine (NORVASC) 5 MG tablet Take 5 mg by mouth daily. 01/22/18  Yes [provider]  carbidopa-levodopa (SINEMET IR) 25-100 MG tablet Take by mouth See admin instructions. Take 1.5 tablet (0800) by mouth in  the morning, then 1.5 tablet (1200) by mouth at noon, then 1.5 tablet by mouth at 5 pm (1700), then 1 tablet by mouth at bedtime (2200), then 1 tablet at 0300. 06/15/19  Yes [provider]  escitalopram (LEXAPRO) 10 MG tablet Take 10 mg by mouth daily.   Yes [provider]  Multiple Vitamins-Minerals (PRESERVISION AREDS 2 PO) Take 1 tablet by mouth daily.   Yes [provider]  rivastigmine (EXELON) 3 MG capsule Take 3 mg by mouth 2 (two) times daily.   Yes [provider]  simvastatin (ZOCOR) 10 MG tablet Take 10 mg by mouth every evening.  01/22/18  Yes [provider]  vitamin B-12 (CYANOCOBALAMIN) 1000 MCG tablet Take 1 tablet (1,000 mcg total) by mouth daily. 10/17/20  Yes Earlie Server, MD     ALLERGIES:  No Known Allergies   SOCIAL HISTORY:  Social History   Socioeconomic History  . Marital status: Married    Spouse name: [name]   . Number of children: 1  . Years of education: Not on file  . Highest education level: Not on file  Occupational History  . Occupation: retired    Comment: Network engineer.  Tobacco Use  . Smoking status: Never Smoker  . Smokeless tobacco: Never Used  Vaping Use  . Vaping Use: Never used  Substance and Sexual Activity  . Alcohol use: Never  . Drug use: Never  . Sexual activity: Not on file  Other Topics Concern  . Not on file  Social History Narrative  . Not on file   Social Determinants of Health   Financial Resource Strain: Not on file  Food Insecurity: Not on file  Transportation Needs: Not on file  Physical Activity: Not on file  Stress: Not on file  Social Connections: Not on file  Intimate Partner Violence: Not on file     FAMILY HISTORY:  Family History  Problem Relation Age of Onset  . Heart disease Mother   . Pancreatitis Mother   . Emphysema Father   . Breast cancer Sister   . Breast cancer Sister       REVIEW OF SYSTEMS:  Review of Systems  Constitutional: Negative for chills  and fever.  Respiratory: Negative for cough and shortness of breath.   Cardiovascular: Negative for chest pain and palpitations.  Gastrointestinal: Negative for abdominal pain, nausea and vomiting.  All other systems reviewed and are negative.   VITAL SIGNS:  Temp:  [97.4 F (36.3 C)-98.4 F (36.9 C)] 98 F (36.7 C) (04/07 0340) Pulse Rate:  [76-88] 83 (04/07 0340) Resp:  [16-20] 16 (04/07 0340) BP: (157-182)/(92-106) 157/92 (04/07 0340) SpO2:  [95 %-98 %] 97 % (04/07 0340)     Height: 6' (182.9 cm) Weight: 68 kg BMI (Calculated): 20.34   INTAKE/OUTPUT:  04/06 0701 - 04/07 0700 In: 1864.6 [P.O.:780; I.V.:1084.6] Out: 3200 [Urine:3200]  PHYSICAL EXAM:  Physical Exam Vitals and nursing note reviewed.  Constitutional:      General: He is not in acute distress.    Appearance: He is well-developed  and normal weight. He is not ill-appearing.     Comments: Patient resting comfortably in bed, wife at bedside, he is hard of hearing  HENT:     Head: Normocephalic and atraumatic.     Right Ear: Decreased hearing noted.     Left Ear: Decreased hearing noted.  Eyes:     General: No scleral icterus.    Conjunctiva/sclera: Conjunctivae normal.  Cardiovascular:     Rate and Rhythm: Normal rate.     Pulses: Normal pulses.     Heart sounds: No murmur heard.   Pulmonary:     Effort: Pulmonary effort is normal. No respiratory distress.  Abdominal:     General: Abdomen is flat. There is no distension.     Palpations: Abdomen is soft.     Tenderness: There is no abdominal tenderness. There is no guarding or rebound. Negative signs include Murphy's sign.     Comments: Abdomen is soft, non-tender, non-distended, no rebound/guarding  Genitourinary:    Comments: Deferred Musculoskeletal:     Right lower leg: No edema.     Left lower leg: No edema.  Skin:    General: Skin is warm and dry.     Coloration: Skin is not pale.     Findings: No erythema.  Neurological:     General: No focal  deficit present.     Mental Status: He is alert. Mental status is at baseline.  Psychiatric:        Mood and Affect: Mood normal.        Behavior: Behavior normal.      Labs:  CBC Latest Ref Rng & Units 01/24/2021 01/23/2021 10/17/2020  WBC 4.0 - 10.5 K/uL 10.5 6.0 4.4  Hemoglobin 13.0 - 17.0 g/dL 11.7(L) 12.2(L) 12.6(L)  Hematocrit 39.0 - 52.0 % 34.7(L) 37.0(L) 37.8(L)  Platelets 150 - 400 K/uL 123(L) 139(L) 141(L)   CMP Latest Ref Rng & Units 01/26/2021 01/24/2021 01/23/2021  Glucose 70 - 99 mg/dL 86 77 96  BUN 8 - 23 mg/dL 12 29(H) 28(H)  Creatinine 0.61 - 1.24 mg/dL 0.73 0.93 1.08  Sodium 135 - 145 mmol/L 138 136 137  Potassium 3.5 - 5.1 mmol/L 3.2(L) 4.0 3.1(L)  Chloride 98 - 111 mmol/L 106 107 104  CO2 22 - 32 mmol/L _0 Calcium 8.9 - 10.3 mg/dL 8.2(L) 7.8(L) 8.8(L)  Total Protein 6.5 - 8.1 g/dL 5.8(L) 5.4(L) 6.5  Total Bilirubin 0.3 - 1.2 mg/dL 1.0 1.0 1.2  Alkaline Phos 38 - 126 U/L 85 98 108  AST 15 - 41 U/L 27 133(H) 350(H)  ALT 0 - 44 U/L 11 55(H) 17     Imaging studies:   CT Abdomen/Pelvis (01/23/2021) personally reviewed showing peripancreatic edema consistent with pancreatitis otherwise unremarkable, and radiologist report reviewed:  IMPRESSION: 1. CT findings most consistent with at least moderately severe acute pancreatitis. Diffusely edematous pancreas, particularly the pancreatic head with extensive inflammatory stranding in the peripancreatic soft tissues extending into the porta hepatis and mesenteric root. No evidence of pseudocyst, free fluid or vascular complication at this time. 2. Similar appearance of duodenal diverticula. 3. Additional ancillary findings as above.   RUQ [place] (01/23/2021) personally reviewed without cholelithiasis, and radiologist report reviewed:  IMPRESSION: 1. No visualized cholelithiasis or choledocholithiasis. 2. Trace perihepatic free fluid, likely related to patient's known Pancreatitis.   MRCP (01/25/2021) personally  reviewed without choledocholithiasis, and radiologist report reviewed:  IMPRESSION: 1. Diffuse pancreatic edema suggested, no gross peripancreatic fluid collection though images are markedly  limited by motion. 2. Large duodenal diverticulum contains gas and results in susceptibility artifact which makes biliary duct assessment in the pancreatic portion of the common bile duct difficult if not impossible, mild intra and extrahepatic biliary duct distension is suggested in the setting of this large duodenal diverticulum. 3. Edema about the duodenum and pancreas. Duodenal inflammation likely secondary to acute pancreatitis. Concomitant duodenitis also considered in the setting of large duodenal diverticulum, attention on follow-up. 4. Mild intrahepatic duct dilation. 5. No substantial ascites. 6. Bilateral pleural effusions and body wall edema compatible with developing anasarca. 7. Cement augmentation of T12 and L1 similar to previous imaging.   Assessment/Plan: (ICD-10's: K64.00) 87 y.o. male admitted with pancreatitis; unknown etiology likely idiopathic; thought to potentially be of biliary etiology however he is without cholelithiasis on [place], no choledocholithiasis on MRCP, and his alk phos and bilirubin have been normal throughout this admission.    - No evidence of gallstone pancreatitis, no indications for surgical interventions  - Okay to advance diet as tolerated given resolutions in abdominal pain   - Wean from IVF   - Monitor abdominal examiantion   - Further management per primary service; we will sign off  All of the above findings and recommendations were discussed with the patient and his wife, and all of their questions were answered to their expressed satisfaction.  Thank you for the opportunity to participate in this patient's care.   -- Edison Simon, PA-C [facility] 01/26/2021, 7:04 AM [phone] M-F: 7am - 4pm

## 2021-01-26 NOTE — Progress Notes (Signed)
Kevin Antigua, MD [address] [place], [place], [place], [address] [address], [place], [place], [place], [address] Phone: [phone]  Fax: [phone]   Subjective: Patient resting in bed comfortably with wife at bedside.  Denies any abdominal pain.  No nausea or vomiting.   Objective: Exam: Vital signs in last 24 hours: Vitals:   01/26/21 0100 01/26/21 0340 01/26/21 0743 01/26/21 0914  BP: (!) 162/96 (!) 157/92 (!) 180/90 (!) 149/80  Pulse: 85 83 81   Resp:  16    Temp:  98 F (36.7 C) 97.8 F (36.6 C)   TempSrc:  Oral    SpO2:  97% 97%   Weight:      Height:       Weight change:   Intake/Output Summary (Last 24 hours) at 01/26/2021 1126 Last data filed at 01/26/2021 1048 Gross per 24 hour  Intake 4439.52 ml  Output 3800 ml  Net 639.52 ml    General: No acute distress, AAO x3 Abd: Soft, NT/ND, No HSM Skin: Warm, no rashes Neck: Supple, Trachea midline   Lab Results: Lab Results  Component Value Date   WBC 10.5 01/24/2021   HGB 11.7 (L) 01/24/2021   HCT 34.7 (L) 01/24/2021   MCV 90.8 01/24/2021   PLT 123 (L) 01/24/2021   Micro Results: Recent Results (from the past 240 hour(s))  Resp Panel by RT-PCR (Flu A&B, Covid) Nasopharyngeal Swab     Status: None   Collection Time: 01/23/21  6:42 AM   Specimen: Nasopharyngeal Swab; Nasopharyngeal(NP) swabs in vial transport medium  Result Value Ref Range Status   SARS Coronavirus 2 by RT PCR NEGATIVE NEGATIVE Final    Comment: (NOTE) SARS-CoV-2 target nucleic acids are NOT DETECTED.  The SARS-CoV-2 RNA is generally detectable in upper respiratory specimens during the acute phase of infection. The lowest concentration of SARS-CoV-2 viral copies this assay can detect is 138 copies/mL. A negative result does not preclude SARS-Cov-2 infection and should not be used as the sole basis for treatment or other patient management decisions. A negative result may occur with  improper specimen  collection/handling, submission of specimen other than nasopharyngeal swab, presence of viral mutation(s) within the areas targeted by this assay, and inadequate number of viral copies(<138 copies/mL). A negative result must be combined with clinical observations, patient history, and epidemiological information. The expected result is Negative.  Fact Sheet for Patients:  EntrepreneurPulse.com.au  Fact Sheet for Healthcare Providers:  IncredibleEmployment.be  This test is no t yet approved or cleared by the [place] FDA and  has been authorized for detection and/or diagnosis of SARS-CoV-2 by FDA under an Emergency Use Authorization (EUA). This EUA will remain  in effect (meaning this test can be used) for the duration of the COVID-19 declaration under Section 564(b)(1) of the Act, 21 U.S.C.section 360bbb-3(b)(1), unless the authorization is terminated  or revoked sooner.       Influenza A by PCR NEGATIVE NEGATIVE Final   Influenza B by PCR NEGATIVE NEGATIVE Final    Comment: (NOTE) The Xpert Xpress SARS-CoV-2/FLU/RSV plus assay is intended as an aid in the diagnosis of influenza from Nasopharyngeal swab specimens and should not be used as a sole basis for treatment. Nasal washings and aspirates are unacceptable for Xpert Xpress SARS-CoV-2/FLU/RSV testing.  Fact Sheet for Patients: EntrepreneurPulse.com.au  Fact Sheet for Healthcare Providers: IncredibleEmployment.be  This test is not yet approved or cleared by the [place] FDA and has been authorized for detection and/or diagnosis of  SARS-CoV-2 by FDA under an Emergency Use Authorization (EUA). This EUA will remain in effect (meaning this test can be used) for the duration of the COVID-19 declaration under Section 564(b)(1) of the Act, 21 U.S.C. section 360bbb-3(b)(1), unless the authorization is terminated or revoked.  Performed at [facility], [address] [place], [place] [address]    Studies/Results: MR 3D Recon At Scanner  Result Date: 01/25/2021 CLINICAL DATA:  Pancreatitis, acute severe pancreatitis. EXAM: MRI ABDOMEN WITHOUT AND WITH CONTRAST (INCLUDING MRCP) TECHNIQUE: Multiplanar multisequence MR imaging of the abdomen was performed both before and after the administration of intravenous contrast. Heavily T2-weighted images of the biliary and pancreatic ducts were obtained, and three-dimensional MRCP images were rendered by post processing. CONTRAST:  74mL GADAVIST GADOBUTROL 1 MMOL/ML IV SOLN COMPARISON:  Angiography with CT from January 23, 2021 FINDINGS: Lower chest: Bilateral pleural effusions. Limited assessment of the lung bases on MRI. Hepatobiliary: Cysts throughout the liver. Wedge-shaped area of hypoattenuation on CT corresponds to increased T2 signal on MRI and lack of enhancement. The large duodenal diverticulum contains gas and results in susceptibility artifact which makes biliary duct assessment in the pancreatic portion of the common bile duct difficult if not impossible. There is a region of tubular increased T2 signal that tracks along the anterior margin of the duodenal diverticulum that may represent the common bile duct best seen on images eight through 31 of the MRCP dataset. This status that is degraded by respiratory variation. No gross filling defect is noted. Suggestion of mild intrahepatic duct dilation. Pancreas: Diffuse pancreatic edema suggested. Motion degraded images do not allow for complete assessment. Parenchymal enhancement is grossly preserved again with very limited evaluation. Portal vein is grossly patent. Splenic vein difficult to assess but also is likely patent. Spleen:  Normal size and contour, limited evaluation. Adrenals/Urinary Tract: Post RIGHT nephrectomy. No gross abnormality in the nephrectomy bed. LEFT renal cyst. Normal adrenal glands. Stomach/Bowel: Peri duodenal stranding.  Large duodenal diverticulum. Vascular/Lymphatic: Atherosclerotic plaque in the abdominal aorta. No aneurysmal dilation. The smaller vessel patency with very limited assessment. There is no gastrohepatic or hepatoduodenal ligament lymphadenopathy. No retroperitoneal or mesenteric lymphadenopathy. Other:  No substantial ascites. Musculoskeletal: Cement augmentation of T12 and L1 similar to previous imaging. IMPRESSION: 1. Diffuse pancreatic edema suggested, no gross peripancreatic fluid collection though images are markedly limited by motion. 2. Large duodenal diverticulum contains gas and results in susceptibility artifact which makes biliary duct assessment in the pancreatic portion of the common bile duct difficult if not impossible, mild intra and extrahepatic biliary duct distension is suggested in the setting of this large duodenal diverticulum. 3. Edema about the duodenum and pancreas. Duodenal inflammation likely secondary to acute pancreatitis. Concomitant duodenitis also considered in the setting of large duodenal diverticulum, attention on follow-up. 4. Mild intrahepatic duct dilation. 5. No substantial ascites. 6. Bilateral pleural effusions and body wall edema compatible with developing anasarca. 7. Cement augmentation of T12 and L1 similar to previous imaging. Electronically Signed   By: Zetta Bills M.D.   On: 01/25/2021 14:56   MR ABDOMEN MRCP W WO CONTAST  Result Date: 01/25/2021 CLINICAL DATA:  Pancreatitis, acute severe pancreatitis. EXAM: MRI ABDOMEN WITHOUT AND WITH CONTRAST (INCLUDING MRCP) TECHNIQUE: Multiplanar multisequence MR imaging of the abdomen was performed both before and after the administration of intravenous contrast. Heavily T2-weighted images of the biliary and pancreatic ducts were obtained, and three-dimensional MRCP images were rendered by post processing. CONTRAST:  85mL GADAVIST GADOBUTROL 1 MMOL/ML IV  SOLN COMPARISON:  Angiography with CT from January 23, 2021 FINDINGS:  Lower chest: Bilateral pleural effusions. Limited assessment of the lung bases on MRI. Hepatobiliary: Cysts throughout the liver. Wedge-shaped area of hypoattenuation on CT corresponds to increased T2 signal on MRI and lack of enhancement. The large duodenal diverticulum contains gas and results in susceptibility artifact which makes biliary duct assessment in the pancreatic portion of the common bile duct difficult if not impossible. There is a region of tubular increased T2 signal that tracks along the anterior margin of the duodenal diverticulum that may represent the common bile duct best seen on images eight through 31 of the MRCP dataset. This status that is degraded by respiratory variation. No gross filling defect is noted. Suggestion of mild intrahepatic duct dilation. Pancreas: Diffuse pancreatic edema suggested. Motion degraded images do not allow for complete assessment. Parenchymal enhancement is grossly preserved again with very limited evaluation. Portal vein is grossly patent. Splenic vein difficult to assess but also is likely patent. Spleen:  Normal size and contour, limited evaluation. Adrenals/Urinary Tract: Post RIGHT nephrectomy. No gross abnormality in the nephrectomy bed. LEFT renal cyst. Normal adrenal glands. Stomach/Bowel: Peri duodenal stranding. Large duodenal diverticulum. Vascular/Lymphatic: Atherosclerotic plaque in the abdominal aorta. No aneurysmal dilation. The smaller vessel patency with very limited assessment. There is no gastrohepatic or hepatoduodenal ligament lymphadenopathy. No retroperitoneal or mesenteric lymphadenopathy. Other:  No substantial ascites. Musculoskeletal: Cement augmentation of T12 and L1 similar to previous imaging. IMPRESSION: 1. Diffuse pancreatic edema suggested, no gross peripancreatic fluid collection though images are markedly limited by motion. 2. Large duodenal diverticulum contains gas and results in susceptibility artifact which makes biliary duct  assessment in the pancreatic portion of the common bile duct difficult if not impossible, mild intra and extrahepatic biliary duct distension is suggested in the setting of this large duodenal diverticulum. 3. Edema about the duodenum and pancreas. Duodenal inflammation likely secondary to acute pancreatitis. Concomitant duodenitis also considered in the setting of large duodenal diverticulum, attention on follow-up. 4. Mild intrahepatic duct dilation. 5. No substantial ascites. 6. Bilateral pleural effusions and body wall edema compatible with developing anasarca. 7. Cement augmentation of T12 and L1 similar to previous imaging. Electronically Signed   By: Zetta Bills M.D.   On: 01/25/2021 14:56   Medications:  Scheduled Meds: . amLODipine  10 mg Oral Daily  . carbidopa-levodopa  1.5 tablet Oral 3 times per day   And  . carbidopa-levodopa  1 tablet Oral 2 times per day  . enoxaparin (LOVENOX) injection  40 mg Subcutaneous Q24H  . escitalopram  10 mg Oral Daily  . multivitamin-lutein  1 capsule Oral Daily  . nystatin cream  1 application Topical BID  . pantoprazole (PROTONIX) IV  40 mg Intravenous Q24H  . potassium chloride  40 mEq Oral Q4H  . psyllium  1 packet Oral Q1200  . rivastigmine  3 mg Oral BID  . vitamin B-12  1,000 mcg Oral Daily   Continuous Infusions: PRN Meds:.labetalol, morphine injection, ondansetron **OR** ondansetron (ZOFRAN) IV   Assessment: Principal Problem:   Acute pancreatitis Active Problems:   Hypokalemia   Dementia (HCC)   GERD (gastroesophageal reflux disease)   Hypertension   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HCC)    Plan: Liver enzymes have normalized at this time Patient is clinically asymptomatic at this time Surgery has evaluated patient and does not recommend surgical intervention  Advance diet to low-fat diet as patient tolerates   LOS: 3  days   Kevin Antigua, MD 01/26/2021, 11:26 AM

## 2021-01-26 NOTE — Progress Notes (Signed)
PROGRESS NOTE    Kevin [name]  CVE:[id number] DOB: 1933/06/11 DOA: 01/23/2021 PCP: Kevin Hire, MD     Brief Narrative:  Kevin [name] is an 87 year old male with history of Parkinson disease, macular degeneration, dementia, hypertension was brought into the ED by EMS for evaluation of sudden onset of epigastric/substernal chest pain which started at 2 AM associated with nausea and multiple episodes of vomiting.  In the ED CT findings were consistent with moderately severe acute pancreatitis.  Showed diffusely edematous pancreas particularly the pancreatic head with extensive inflammatory stranding in the peripancreatic soft tissue extending into the porta hepatis and mesenteric root. Lipase was elevated to 3,721.  Patient was admitted for acute pancreatitis.  New events last 24 hours / Subjective: Patient hard of hearing and somewhat confused. He is about to get washed up with OT. He denies any abdominal pain. Per wife, patient tolerated full liquid diet this morning without any complaints.   Assessment & Plan:   Principal Problem:   Acute pancreatitis Active Problems:   Hypokalemia   Dementia (HCC)   GERD (gastroesophageal reflux disease)   Hypertension   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HCC)   Acute pancreatitis -CTA C/A/P: CT findings most consistent with at least moderately severe acute pancreatitis. Diffusely edematous pancreas, particularly the pancreatic head with extensive inflammatory stranding in the peripancreatic soft tissues extending into the porta hepatis and mesenteric root. No evidence of pseudocyst, free fluid or vascular complication at this time. -RUQ [place]: No visualized cholelithiasis or choledocholithiasis -Triglycerides normal 38  -MRCP: Diffuse pancreatic edema suggested, no gross peripancreatic fluid collection though images are markedly limited by motion. Large duodenal diverticulum contains gas and results in susceptibility  artifact which makes biliary duct assessment in the pancreatic portion of the common bile duct difficult if not impossible, mild intra and extrahepatic biliary duct distension is suggested in the setting of this large duodenal diverticulum. -GI consulted, recommended supportive care for pancreatitis -IgG4 normal 36  -General surgery consulted, no indication for surgical intervention  -Improving, advance diet today   Dementia due to Parkinson's disease -Continue Sinemet, Lexapro, Exelon  Hypertension -Continue amlodipine  Hypokalemia -Replace, trend   Elevated liver enzymes -Resolved    DVT prophylaxis:  enoxaparin (LOVENOX) injection 40 mg Start: 01/23/21 2200 SCDs Start: 01/23/21 0858  Code Status: DNR Family Communication: Wife at bedside  Disposition Plan:  Status is: Inpatient  Remains inpatient appropriate because:Ongoing diagnostic testing needed not appropriate for outpatient work up, IV treatments appropriate due to intensity of illness or inability to take PO and Inpatient level of care appropriate due to severity of illness   Dispo: The patient is from: Home              Anticipated d/c is to: SNF              Patient currently is medically stable to d/c.  SNF placement pending, patient needs to be without sitter up to 48 hours before SNF acceptance    Difficult to place patient No      Consultants:   GI  General surgery    Antimicrobials:  Anti-infectives (From admission, onward)   None       Objective: Vitals:   01/26/21 0100 01/26/21 0340 01/26/21 0743 01/26/21 0914  BP: (!) 162/96 (!) 157/92 (!) 180/90 (!) 149/80  Pulse: 85 83 81   Resp:  16    Temp:  98 F (36.7 C) 97.8 F (36.6 C)  TempSrc:  Oral    SpO2:  97% 97%   Weight:      Height:        Intake/Output Summary (Last 24 hours) at 01/26/2021 1116 Last data filed at 01/26/2021 1048 Gross per 24 hour  Intake 4439.52 ml  Output 3800 ml  Net 639.52 ml   Filed Weights   01/23/21  0259  Weight: 68 kg    Examination: General exam: Appears calm and comfortable  Respiratory system: Clear to auscultation. Respiratory effort normal. Cardiovascular system: S1 & S2 heard, RRR. No pedal edema. Gastrointestinal system: Abdomen is nondistended, soft and nontender. Normal bowel sounds heard. Central nervous system: Alert. Non focal exam Extremities: Symmetric in appearance bilaterally  Skin: No rashes, lesions or ulcers on exposed skin  Psychiatry: Mood & affect appropriate.    Data Reviewed: I have personally reviewed following labs and imaging studies  CBC: Recent Labs  Lab 01/23/21 0305 01/24/21 0501  WBC 6.0 10.5  NEUTROABS 5.3  --   HGB 12.2* 11.7*  HCT 37.0* 34.7*  MCV 92.0 90.8  PLT 139* 998*   Basic Metabolic Panel: Recent Labs  Lab 01/23/21 0305 01/24/21 0501 01/26/21 0604  NA 137 136 138  K 3.1* 4.0 3.2*  CL 104 107 106  CO2 26 23 23   GLUCOSE 96 77 86  BUN 28* 29* 12  CREATININE 1.08 0.93 0.73  CALCIUM 8.8* 7.8* 8.2*   GFR: Estimated Creatinine Clearance: 63.8 mL/min (by C-G formula based on SCr of 0.73 mg/dL). Liver Function Tests: Recent Labs  Lab 01/23/21 0305 01/24/21 0501 01/26/21 0604  AST 350* 133* 27  ALT 17 55* 11  ALKPHOS 108 98 85  BILITOT 1.2 1.0 1.0  PROT 6.5 5.4* 5.8*  ALBUMIN 3.9 3.0* 3.1*   Recent Labs  Lab 01/23/21 0305 01/25/21 0558  LIPASE 3,721* 369*   No results for input(s): AMMONIA in the last 168 hours. Coagulation Profile: No results for input(s): INR, PROTIME in the last 168 hours. Cardiac Enzymes: No results for input(s): CKTOTAL, CKMB, CKMBINDEX, TROPONINI in the last 168 hours. BNP (last 3 results) No results for input(s): PROBNP in the last 8760 hours. HbA1C: No results for input(s): HGBA1C in the last 72 hours. CBG: No results for input(s): GLUCAP in the last 168 hours. Lipid Profile: Recent Labs    01/24/21 0501  CHOL 102  HDL 50  LDLCALC 44  TRIG 38  CHOLHDL 2.0   Thyroid  Function Tests: No results for input(s): TSH, T4TOTAL, FREET4, T3FREE, THYROIDAB in the last 72 hours. Anemia Panel: No results for input(s): VITAMINB12, FOLATE, FERRITIN, TIBC, IRON, RETICCTPCT in the last 72 hours. Sepsis Labs: No results for input(s): PROCALCITON, LATICACIDVEN in the last 168 hours.  Recent Results (from the past 240 hour(s))  Resp Panel by RT-PCR (Flu A&B, Covid) Nasopharyngeal Swab     Status: None   Collection Time: 01/23/21  6:42 AM   Specimen: Nasopharyngeal Swab; Nasopharyngeal(NP) swabs in vial transport medium  Result Value Ref Range Status   SARS Coronavirus 2 by RT PCR NEGATIVE NEGATIVE Final    Comment: (NOTE) SARS-CoV-2 target nucleic acids are NOT DETECTED.  The SARS-CoV-2 RNA is generally detectable in upper respiratory specimens during the acute phase of infection. The lowest concentration of SARS-CoV-2 viral copies this assay can detect is 138 copies/mL. A negative result does not preclude SARS-Cov-2 infection and should not be used as the sole basis for treatment or other patient management decisions. A negative result may occur with  improper specimen collection/handling, submission of specimen other than nasopharyngeal swab, presence of viral mutation(s) within the areas targeted by this assay, and inadequate number of viral copies(<138 copies/mL). A negative result must be combined with clinical observations, patient history, and epidemiological information. The expected result is Negative.  Fact Sheet for Patients:  EntrepreneurPulse.com.au  Fact Sheet for Healthcare Providers:  IncredibleEmployment.be  This test is no t yet approved or cleared by the [place] FDA and  has been authorized for detection and/or diagnosis of SARS-CoV-2 by FDA under an Emergency Use Authorization (EUA). This EUA will remain  in effect (meaning this test can be used) for the duration of the COVID-19 declaration under  Section 564(b)(1) of the Act, 21 U.S.C.section 360bbb-3(b)(1), unless the authorization is terminated  or revoked sooner.       Influenza A by PCR NEGATIVE NEGATIVE Final   Influenza B by PCR NEGATIVE NEGATIVE Final    Comment: (NOTE) The Xpert Xpress SARS-CoV-2/FLU/RSV plus assay is intended as an aid in the diagnosis of influenza from Nasopharyngeal swab specimens and should not be used as a sole basis for treatment. Nasal washings and aspirates are unacceptable for Xpert Xpress SARS-CoV-2/FLU/RSV testing.  Fact Sheet for Patients: EntrepreneurPulse.com.au  Fact Sheet for Healthcare Providers: IncredibleEmployment.be  This test is not yet approved or cleared by the [place] FDA and has been authorized for detection and/or diagnosis of SARS-CoV-2 by FDA under an Emergency Use Authorization (EUA). This EUA will remain in effect (meaning this test can be used) for the duration of the COVID-19 declaration under Section 564(b)(1) of the Act, 21 U.S.C. section 360bbb-3(b)(1), unless the authorization is terminated or revoked.  Performed at [facility], [address] [place], [place] [address]       Radiology Studies: MR 3D Recon At Scanner  Result Date: 01/25/2021 CLINICAL DATA:  Pancreatitis, acute severe pancreatitis. EXAM: MRI ABDOMEN WITHOUT AND WITH CONTRAST (INCLUDING MRCP) TECHNIQUE: Multiplanar multisequence MR imaging of the abdomen was performed both before and after the administration of intravenous contrast. Heavily T2-weighted images of the biliary and pancreatic ducts were obtained, and three-dimensional MRCP images were rendered by post processing. CONTRAST:  40mL GADAVIST GADOBUTROL 1 MMOL/ML IV SOLN COMPARISON:  Angiography with CT from January 23, 2021 FINDINGS: Lower chest: Bilateral pleural effusions. Limited assessment of the lung bases on MRI. Hepatobiliary: Cysts throughout the liver. Wedge-shaped area of  hypoattenuation on CT corresponds to increased T2 signal on MRI and lack of enhancement. The large duodenal diverticulum contains gas and results in susceptibility artifact which makes biliary duct assessment in the pancreatic portion of the common bile duct difficult if not impossible. There is a region of tubular increased T2 signal that tracks along the anterior margin of the duodenal diverticulum that may represent the common bile duct best seen on images eight through 31 of the MRCP dataset. This status that is degraded by respiratory variation. No gross filling defect is noted. Suggestion of mild intrahepatic duct dilation. Pancreas: Diffuse pancreatic edema suggested. Motion degraded images do not allow for complete assessment. Parenchymal enhancement is grossly preserved again with very limited evaluation. Portal vein is grossly patent. Splenic vein difficult to assess but also is likely patent. Spleen:  Normal size and contour, limited evaluation. Adrenals/Urinary Tract: Post RIGHT nephrectomy. No gross abnormality in the nephrectomy bed. LEFT renal cyst. Normal adrenal glands. Stomach/Bowel: Peri duodenal stranding. Large duodenal diverticulum. Vascular/Lymphatic: Atherosclerotic plaque in the abdominal aorta. No aneurysmal dilation. The smaller vessel patency with very limited  assessment. There is no gastrohepatic or hepatoduodenal ligament lymphadenopathy. No retroperitoneal or mesenteric lymphadenopathy. Other:  No substantial ascites. Musculoskeletal: Cement augmentation of T12 and L1 similar to previous imaging. IMPRESSION: 1. Diffuse pancreatic edema suggested, no gross peripancreatic fluid collection though images are markedly limited by motion. 2. Large duodenal diverticulum contains gas and results in susceptibility artifact which makes biliary duct assessment in the pancreatic portion of the common bile duct difficult if not impossible, mild intra and extrahepatic biliary duct distension is  suggested in the setting of this large duodenal diverticulum. 3. Edema about the duodenum and pancreas. Duodenal inflammation likely secondary to acute pancreatitis. Concomitant duodenitis also considered in the setting of large duodenal diverticulum, attention on follow-up. 4. Mild intrahepatic duct dilation. 5. No substantial ascites. 6. Bilateral pleural effusions and body wall edema compatible with developing anasarca. 7. Cement augmentation of T12 and L1 similar to previous imaging. Electronically Signed   By: Zetta Bills M.D.   On: 01/25/2021 14:56   MR ABDOMEN MRCP W WO CONTAST  Result Date: 01/25/2021 CLINICAL DATA:  Pancreatitis, acute severe pancreatitis. EXAM: MRI ABDOMEN WITHOUT AND WITH CONTRAST (INCLUDING MRCP) TECHNIQUE: Multiplanar multisequence MR imaging of the abdomen was performed both before and after the administration of intravenous contrast. Heavily T2-weighted images of the biliary and pancreatic ducts were obtained, and three-dimensional MRCP images were rendered by post processing. CONTRAST:  78mL GADAVIST GADOBUTROL 1 MMOL/ML IV SOLN COMPARISON:  Angiography with CT from January 23, 2021 FINDINGS: Lower chest: Bilateral pleural effusions. Limited assessment of the lung bases on MRI. Hepatobiliary: Cysts throughout the liver. Wedge-shaped area of hypoattenuation on CT corresponds to increased T2 signal on MRI and lack of enhancement. The large duodenal diverticulum contains gas and results in susceptibility artifact which makes biliary duct assessment in the pancreatic portion of the common bile duct difficult if not impossible. There is a region of tubular increased T2 signal that tracks along the anterior margin of the duodenal diverticulum that may represent the common bile duct best seen on images eight through 31 of the MRCP dataset. This status that is degraded by respiratory variation. No gross filling defect is noted. Suggestion of mild intrahepatic duct dilation. Pancreas:  Diffuse pancreatic edema suggested. Motion degraded images do not allow for complete assessment. Parenchymal enhancement is grossly preserved again with very limited evaluation. Portal vein is grossly patent. Splenic vein difficult to assess but also is likely patent. Spleen:  Normal size and contour, limited evaluation. Adrenals/Urinary Tract: Post RIGHT nephrectomy. No gross abnormality in the nephrectomy bed. LEFT renal cyst. Normal adrenal glands. Stomach/Bowel: Peri duodenal stranding. Large duodenal diverticulum. Vascular/Lymphatic: Atherosclerotic plaque in the abdominal aorta. No aneurysmal dilation. The smaller vessel patency with very limited assessment. There is no gastrohepatic or hepatoduodenal ligament lymphadenopathy. No retroperitoneal or mesenteric lymphadenopathy. Other:  No substantial ascites. Musculoskeletal: Cement augmentation of T12 and L1 similar to previous imaging. IMPRESSION: 1. Diffuse pancreatic edema suggested, no gross peripancreatic fluid collection though images are markedly limited by motion. 2. Large duodenal diverticulum contains gas and results in susceptibility artifact which makes biliary duct assessment in the pancreatic portion of the common bile duct difficult if not impossible, mild intra and extrahepatic biliary duct distension is suggested in the setting of this large duodenal diverticulum. 3. Edema about the duodenum and pancreas. Duodenal inflammation likely secondary to acute pancreatitis. Concomitant duodenitis also considered in the setting of large duodenal diverticulum, attention on follow-up. 4. Mild intrahepatic duct dilation. 5. No substantial ascites. 6. Bilateral pleural  effusions and body wall edema compatible with developing anasarca. 7. Cement augmentation of T12 and L1 similar to previous imaging. Electronically Signed   By: Zetta Bills M.D.   On: 01/25/2021 14:56      Scheduled Meds: . amLODipine  10 mg Oral Daily  . carbidopa-levodopa  1.5  tablet Oral 3 times per day   And  . carbidopa-levodopa  1 tablet Oral 2 times per day  . enoxaparin (LOVENOX) injection  40 mg Subcutaneous Q24H  . escitalopram  10 mg Oral Daily  .  morphine injection  4 mg Intravenous Once  . multivitamin-lutein  1 capsule Oral Daily  . nystatin cream  1 application Topical BID  . pantoprazole (PROTONIX) IV  40 mg Intravenous Q24H  . potassium chloride  40 mEq Oral Q4H  . psyllium  1 packet Oral Q1200  . rivastigmine  3 mg Oral BID  . vitamin B-12  1,000 mcg Oral Daily   Continuous Infusions:    LOS: 3 days      Time spent: 25 minutes   Dessa Phi, DO [facility] 01/26/2021, 11:16 AM   Available via Epic secure chat 7am-7pm After these hours, please refer to coverage provider listed on [email or web address]

## 2021-01-26 NOTE — Care Management Important Message (Signed)
Important Message  Patient Details  Name: Kevin [name] MRN: [id number] Date of Birth: 1933-06-14   Medicare Important Message Given:  Yes     Dannette Barbara 01/26/2021, 12:52 PM

## 2021-01-26 NOTE — Evaluation (Signed)
Occupational Therapy Evaluation Patient Details Name: Kevin [name] MRN: [id number] DOB: 09-Feb-1933 Today's Date: 01/26/2021    History of Present Illness Pt admitted for acute pancreatitis with complaints of substernal chest pain and nausea. History includes Parkinson's disease, macular degeneration, dementia, and HTN   Clinical Impression   Pt seen for OT evaluation this date in setting of acute hospitalization d/t pancreatitis. Pt presents this date with decreased strength and fxl activity tolerance. Pt's spouse is present in room and reports that pt is typically able to perform his basic self care and fxl mobility at home with no assistance. States that she does the driving and gets groceries. This date, pt requires SETUP for seated UB ADLs, SUPV/GCA for seated LB ADLs d/t more dynamic sitting balance req'ed, requires MIN A/CGA for ADL Transfers with RW as well as cues to sequence/for safety. Requires MOD A for thorough completion of LB posterior bathing/drying/peri care d/t limited awareness and dynamic balance. Will continue to follow acutely. At this time, as pt is below his functional baseline and his spouse does not feel she can provide for his care needs at home, will anticipate that pt will require STR f/u upon d/c from acute setting to improve strength for fxl mobility and self care.     Follow Up Recommendations  SNF    Equipment Recommendations  3 in 1 bedside commode;Tub/shower seat;Other (comment) (2ww)    Recommendations for Other Services       Precautions / Restrictions Precautions Precautions: Fall Restrictions Weight Bearing Restrictions: No      Mobility Bed Mobility Overal bed mobility: Needs Assistance Bed Mobility: Supine to Sit     Supine to sit: Min guard;Min assist;HOB elevated     General bed mobility comments: increased time    Transfers Overall transfer level: Needs assistance Equipment used: Rolling walker (2 wheeled) Transfers: Sit to/from  Stand Sit to Stand: Min guard         General transfer comment: impulsive, increased time, cues for hand placement. States he prefers 4WW    Balance Overall balance assessment: Needs assistance Sitting-balance support: No upper extremity supported Sitting balance-Leahy Scale: Good     Standing balance support: Bilateral upper extremity supported Standing balance-Leahy Scale: Fair                             ADL either performed or assessed with clinical judgement   ADL Overall ADL's : Needs assistance/impaired                                       General ADL Comments: requires SETUP for seated UB ADLs, SUPV/GCA for seated LB ADLs d/t more dynamic sitting balance req'ed, requires MIN A/CGA for ADL Transfers with RW as well as cues to sequence/for safety. Requires MOD A for thorough completion of LB posterior bathing/drying/peri care d/t limited awareness and dynamic balance.     Vision Baseline Vision/History: Wears glasses Wears Glasses: At all times Patient Visual Report: No change from baseline       Perception     Praxis      Pertinent Vitals/Pain Pain Assessment: No/denies pain     Hand Dominance     Extremity/Trunk Assessment Upper Extremity Assessment Upper Extremity Assessment: Overall WFL for tasks assessed;Generalized weakness (ROM is fxl, however, MMT is grossly 4-/5)   Lower Extremity Assessment Lower Extremity  Assessment: Defer to PT evaluation;Generalized weakness       Communication Communication Communication: HOH   Cognition Arousal/Alertness: Awake/alert Behavior During Therapy: Impulsive Overall Cognitive Status: Impaired/Different from baseline                                 General Comments: A&O x 2, however still is impulsive and somewhat perseverative requiring MIN verbal/tactile cues to gentle re-direct/attend to commands and cues.   General Comments       Exercises Other  Exercises Other Exercises: OT engages pt and spouse in ed re: role of OT. Engaged pt in seated and standing bathing and dressing tasks. cues for safe use of RW. Pt with moderate reception of education. Somewhat poor short term memory/carry over   Shoulder Instructions      Home Living Family/patient expects to be discharged to:: Private residence Living Arrangements: Spouse/significant other Available Help at Discharge: Family Type of Home: House       Home Layout: One level                   Additional Comments: pt is confused and very HOH. Poor historian. From EMR, pt lives in indep living with wife      Prior Functioning/Environment Level of Independence: Independent with assistive device(s)        Comments: pt is poor historian, per chart uses RW at baseline        OT Problem List: Decreased strength;Decreased activity tolerance;Decreased safety awareness;Decreased knowledge of use of DME or AE;Decreased knowledge of precautions      OT Treatment/Interventions: Self-care/ADL training;DME and/or AE instruction;Therapeutic activities;Balance training;Therapeutic exercise;Energy conservation;Patient/family education;Neuromuscular education    OT Goals(Current goals can be found in the care plan section) Acute Rehab OT Goals Patient Stated Goal: unable to state OT Goal Formulation: With patient Time For Goal Achievement: 02/09/21 Potential to Achieve Goals: Good ADL Goals Pt Will Perform Lower Body Bathing: with supervision;sit to/from stand (with LRAD, sink side versus to/from shower seat) Pt Will Perform Lower Body Dressing: with supervision;sit to/from stand (with LRAD PRN) Pt Will Transfer to Toilet: with supervision;ambulating (with LRAD To restroom with use of grab bars PRN) Pt Will Perform Toileting - Clothing Manipulation and hygiene: with supervision;sit to/from stand (with <10% verbal cues for thorough completion/sequencing) Pt Will Perform Tub/Shower  Transfer: with supervision;ambulating;rolling walker (with use of LRAD and grab bars with only one rest break for fxl mobility to/from restroom) Pt/caregiver will Perform Home Exercise Program: Increased strength;Both right and left upper extremity;With minimal assist  OT Frequency: Min 1X/week   Barriers to D/C:            Co-evaluation              AM-PAC OT "6 Clicks" Daily Activity     Outcome Measure Help from another person eating meals?: A Little Help from another person taking care of personal grooming?: A Little Help from another person toileting, which includes using toliet, bedpan, or urinal?: A Lot Help from another person bathing (including washing, rinsing, drying)?: A Lot Help from another person to put on and taking off regular upper body clothing?: A Little Help from another person to put on and taking off regular lower body clothing?: A Little 6 Click Score: 16   End of Session Equipment Utilized During Treatment: Gait belt;Rolling walker Nurse Communication: Mobility status  Activity Tolerance: Patient tolerated treatment well Patient left: Other (comment) (  seated EOB With PT preparing to start her session)  OT Visit Diagnosis: Unsteadiness on feet (R26.81);Muscle weakness (generalized) (M62.81)                Time: 0950-1030 OT Time Calculation (min): 40 min Charges:  OT General Charges $OT Visit: 1 Visit OT Evaluation $OT Eval Moderate Complexity: 1 Mod OT Treatments $Self Care/Home Management : 8-22 mins $Therapeutic Activity: 8-22 mins  Gerrianne Scale, MS, OTR/L ascom [phone] 01/26/21, 3:30 PM

## 2021-01-26 NOTE — Progress Notes (Signed)
Pt discharged home in care of wife via private vehicle with all belongings. Discharge instructions reviewed in detail with wife prior, all questions answered.

## 2021-01-26 NOTE — Discharge Summary (Signed)
Physician Discharge Summary  Kevin Kevin XKG:[id number] DOB: [birth year] DOA: 01/23/2021  PCP: Baxter Hire, MD  Admit date: 01/23/2021 Discharge date: 01/26/2021  Admitted From: Home Disposition:  Home, declined SNF placement   Recommendations for Outpatient Follow-up:  1. Follow up with PCP in 1 week  Discharge Condition: Stable CODE STATUS: DNR  Diet recommendation: Soft diet   Brief/Interim Summary: Kevin Kevin is an 87 year old male with history of Parkinson disease, macular degeneration, dementia, hypertension was brought into the ED by EMS for evaluation of sudden onset of epigastric/substernal chest pain which started at 2 AM associated with nausea and multiple episodes of vomiting. In the ED CT findings were consistent with moderately severe acute pancreatitis. Showed diffusely edematous pancreas particularly the pancreatic head with extensive inflammatory stranding in the peripancreatic soft tissue extending into the porta hepatis and mesenteric root. Lipase was elevated to 3,721.  Patient was admitted for acute pancreatitis. GI and general surgery were consulted. MRCP completed as well as RUQ [place]. Patient's symptoms continued to improve with supportive care. Surgical intervention was not recommended. Patient tolerated soft diet and wife wanted to take patient home instead of waiting for SNF placement.   Discharge Diagnoses:  Principal Problem:   Acute pancreatitis Active Problems:   Hypokalemia   Dementia (HCC)   GERD (gastroesophageal reflux disease)   Hypertension   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HCC)   Acute pancreatitis -CTA C/A/P: CT findings most consistent with at least moderately severe acute pancreatitis. Diffusely edematous pancreas, particularly the pancreatic head with extensive inflammatory stranding in the peripancreatic soft tissues extending into the porta hepatis and mesenteric root. No evidence of pseudocyst, free fluid  or vascular complication at this time. -RUQ [place]: No visualized cholelithiasis or choledocholithiasis -Triglycerides normal 38  -MRCP: Diffuse pancreatic edema suggested, no gross peripancreatic fluid collection though images are markedly limited by motion. Large duodenal diverticulum contains gas and results in susceptibility artifact which makes biliary duct assessment in the pancreatic portion of the common bile duct difficult if not impossible, mild intra and extrahepatic biliary duct distension is suggested in the setting of this large duodenal diverticulum. -GI consulted, recommended supportive care for pancreatitis -IgG4 normal 36  -General surgery consulted, no indication for surgical intervention  -Improved  Dementia due to Parkinson's disease -Continue Sinemet, Lexapro, Exelon  Hypertension -Continue amlodipine  Hypokalemia -Replace, trend   Elevated liver enzymes -Resolved    Discharge Instructions  Discharge Instructions    Call MD for:  difficulty breathing, headache or visual disturbances   Complete by: As directed    Call MD for:  extreme fatigue   Complete by: As directed    Call MD for:  persistant dizziness or light-headedness   Complete by: As directed    Call MD for:  persistant nausea and vomiting   Complete by: As directed    Call MD for:  severe uncontrolled pain   Complete by: As directed    Call MD for:  temperature >100.4   Complete by: As directed    Discharge instructions   Complete by: As directed    You were cared for by a hospitalist during your hospital stay. If you have any questions about your discharge medications or the care you received while you were in the hospital after you are discharged, you can call the unit and ask to speak with the hospitalist on call if the hospitalist that took care of you is not available. Once you are discharged,  your primary care physician will handle any further medical issues. Please note that NO REFILLS for  any discharge medications will be authorized once you are discharged, as it is imperative that you return to your primary care physician (or establish a relationship with a primary care physician if you do not have one) for your aftercare needs so that they can reassess your need for medications and monitor your lab values.   Increase activity slowly   Complete by: As directed      Allergies as of 01/26/2021   No Known Allergies     Medication List    STOP taking these medications   simvastatin 10 MG tablet Commonly known as: ZOCOR     TAKE these medications   alendronate 70 MG tablet Commonly known as: FOSAMAX Take 70 mg by mouth once a week. Saturday   amLODipine 5 MG tablet Commonly known as: NORVASC Take 2 tablets (10 mg total) by mouth daily. What changed: how much to take   atorvastatin 20 MG tablet Commonly known as: Lipitor Take 1 tablet (20 mg total) by mouth daily.   carbidopa-levodopa 25-100 MG tablet Commonly known as: SINEMET IR Take by mouth See admin instructions. Take 1.5 tablet (0800) by mouth in the morning, then 1.5 tablet (1200) by mouth at noon, then 1.5 tablet by mouth at 5 pm (1700), then 1 tablet by mouth at bedtime (2200), then 1 tablet at 0300.   escitalopram 10 MG tablet Commonly known as: LEXAPRO Take 10 mg by mouth daily.   PRESERVISION AREDS 2 PO Take 1 tablet by mouth daily.   rivastigmine 3 MG capsule Commonly known as: EXELON Take 3 mg by mouth 2 (two) times daily.   vitamin B-12 1000 MCG tablet Commonly known as: CYANOCOBALAMIN Take 1 tablet (1,000 mcg total) by mouth daily.       Follow-up Information    Baxter Hire, MD Follow up.   Specialty: Internal Medicine Contact information: [place] [address] [phone]              No Known Allergies  Consultations:  GI  General surgery    Procedures/Studies: MR 3D Recon At Scanner  Result Date: 01/25/2021 CLINICAL DATA:  Pancreatitis,  acute severe pancreatitis. EXAM: MRI ABDOMEN WITHOUT AND WITH CONTRAST (INCLUDING MRCP) TECHNIQUE: Multiplanar multisequence MR imaging of the abdomen was performed both before and after the administration of intravenous contrast. Heavily T2-weighted images of the biliary and pancreatic ducts were obtained, and three-dimensional MRCP images were rendered by post processing. CONTRAST:  32mL GADAVIST GADOBUTROL 1 MMOL/ML IV SOLN COMPARISON:  Angiography with CT from January 23, 2021 FINDINGS: Lower chest: Bilateral pleural effusions. Limited assessment of the lung bases on MRI. Hepatobiliary: Cysts throughout the liver. Wedge-shaped area of hypoattenuation on CT corresponds to increased T2 signal on MRI and lack of enhancement. The large duodenal diverticulum contains gas and results in susceptibility artifact which makes biliary duct assessment in the pancreatic portion of the common bile duct difficult if not impossible. There is a region of tubular increased T2 signal that tracks along the anterior margin of the duodenal diverticulum that may represent the common bile duct best seen on images eight through 31 of the MRCP dataset. This status that is degraded by respiratory variation. No gross filling defect is noted. Suggestion of mild intrahepatic duct dilation. Pancreas: Diffuse pancreatic edema suggested. Motion degraded images do not allow for complete assessment. Parenchymal enhancement is grossly preserved again with very limited  evaluation. Portal vein is grossly patent. Splenic vein difficult to assess but also is likely patent. Spleen:  Normal size and contour, limited evaluation. Adrenals/Urinary Tract: Post RIGHT nephrectomy. No gross abnormality in the nephrectomy bed. LEFT renal cyst. Normal adrenal glands. Stomach/Bowel: Peri duodenal stranding. Large duodenal diverticulum. Vascular/Lymphatic: Atherosclerotic plaque in the abdominal aorta. No aneurysmal dilation. The smaller vessel patency with very  limited assessment. There is no gastrohepatic or hepatoduodenal ligament lymphadenopathy. No retroperitoneal or mesenteric lymphadenopathy. Other:  No substantial ascites. Musculoskeletal: Cement augmentation of T12 and L1 similar to previous imaging. IMPRESSION: 1. Diffuse pancreatic edema suggested, no gross peripancreatic fluid collection though images are markedly limited by motion. 2. Large duodenal diverticulum contains gas and results in susceptibility artifact which makes biliary duct assessment in the pancreatic portion of the common bile duct difficult if not impossible, mild intra and extrahepatic biliary duct distension is suggested in the setting of this large duodenal diverticulum. 3. Edema about the duodenum and pancreas. Duodenal inflammation likely secondary to acute pancreatitis. Concomitant duodenitis also considered in the setting of large duodenal diverticulum, attention on follow-up. 4. Mild intrahepatic duct dilation. 5. No substantial ascites. 6. Bilateral pleural effusions and body wall edema compatible with developing anasarca. 7. Cement augmentation of T12 and L1 similar to previous imaging. Electronically Signed   By: Zetta Bills M.D.   On: 01/25/2021 14:56   MR ABDOMEN MRCP W WO CONTAST  Result Date: 01/25/2021 CLINICAL DATA:  Pancreatitis, acute severe pancreatitis. EXAM: MRI ABDOMEN WITHOUT AND WITH CONTRAST (INCLUDING MRCP) TECHNIQUE: Multiplanar multisequence MR imaging of the abdomen was performed both before and after the administration of intravenous contrast. Heavily T2-weighted images of the biliary and pancreatic ducts were obtained, and three-dimensional MRCP images were rendered by post processing. CONTRAST:  36mL GADAVIST GADOBUTROL 1 MMOL/ML IV SOLN COMPARISON:  Angiography with CT from January 23, 2021 FINDINGS: Lower chest: Bilateral pleural effusions. Limited assessment of the lung bases on MRI. Hepatobiliary: Cysts throughout the liver. Wedge-shaped area of  hypoattenuation on CT corresponds to increased T2 signal on MRI and lack of enhancement. The large duodenal diverticulum contains gas and results in susceptibility artifact which makes biliary duct assessment in the pancreatic portion of the common bile duct difficult if not impossible. There is a region of tubular increased T2 signal that tracks along the anterior margin of the duodenal diverticulum that may represent the common bile duct best seen on images eight through 31 of the MRCP dataset. This status that is degraded by respiratory variation. No gross filling defect is noted. Suggestion of mild intrahepatic duct dilation. Pancreas: Diffuse pancreatic edema suggested. Motion degraded images do not allow for complete assessment. Parenchymal enhancement is grossly preserved again with very limited evaluation. Portal vein is grossly patent. Splenic vein difficult to assess but also is likely patent. Spleen:  Normal size and contour, limited evaluation. Adrenals/Urinary Tract: Post RIGHT nephrectomy. No gross abnormality in the nephrectomy bed. LEFT renal cyst. Normal adrenal glands. Stomach/Bowel: Peri duodenal stranding. Large duodenal diverticulum. Vascular/Lymphatic: Atherosclerotic plaque in the abdominal aorta. No aneurysmal dilation. The smaller vessel patency with very limited assessment. There is no gastrohepatic or hepatoduodenal ligament lymphadenopathy. No retroperitoneal or mesenteric lymphadenopathy. Other:  No substantial ascites. Musculoskeletal: Cement augmentation of T12 and L1 similar to previous imaging. IMPRESSION: 1. Diffuse pancreatic edema suggested, no gross peripancreatic fluid collection though images are markedly limited by motion. 2. Large duodenal diverticulum contains gas and results in susceptibility artifact which makes biliary duct assessment in the pancreatic portion  of the common bile duct difficult if not impossible, mild intra and extrahepatic biliary duct distension is  suggested in the setting of this large duodenal diverticulum. 3. Edema about the duodenum and pancreas. Duodenal inflammation likely secondary to acute pancreatitis. Concomitant duodenitis also considered in the setting of large duodenal diverticulum, attention on follow-up. 4. Mild intrahepatic duct dilation. 5. No substantial ascites. 6. Bilateral pleural effusions and body wall edema compatible with developing anasarca. 7. Cement augmentation of T12 and L1 similar to previous imaging. Electronically Signed   By: Zetta Bills M.D.   On: 01/25/2021 14:56   CT Angio Chest/Abd/Pel for Dissection W and/or Wo Contrast  Result Date: 01/23/2021 CLINICAL DATA:  Chest and abdominal pain. Evaluate for aortic dissection. EXAM: CT ANGIOGRAPHY CHEST, ABDOMEN AND PELVIS TECHNIQUE: Non-contrast CT of the chest was initially obtained. Multidetector CT imaging through the chest, abdomen and pelvis was performed using the standard protocol during bolus administration of intravenous contrast. Multiplanar reconstructed images and MIPs were obtained and reviewed to evaluate the vascular anatomy. CONTRAST:  138mL OMNIPAQUE IOHEXOL 350 MG/ML SOLN COMPARISON:  CT scan of the abdomen and pelvis 11/09/2020; prior CTA of the chest 03/04/2019; prior thyroid ultrasound 07/11/2018; MRI abdomen 05/26/2018 FINDINGS: CTA CHEST FINDINGS Cardiovascular: Initial noncontrast enhanced images demonstrates no evidence of acute intramural hematoma. Following the administration of intravenous contrast, there is adequate opacification of the thoracic aorta and branch vessels. Normal caliber aortic root and ascending thoracic aorta. No evidence of aneurysm. Conventional 3 vessel arch anatomy. Elongation of the aortic isthmus and proximal descending thoracic aorta resulting in a type 3 arch. Scattered atherosclerotic vascular calcifications. No evidence of dissection. Calcifications visualized along the coronary arteries. The heart is normal in size. No  pericardial effusion. Normal caliber main pulmonary artery. Mediastinum/Nodes: Right-sided thyroid nodule is nonspecific by CT imaging. This has been previously imaged with ultrasound and patient underwent biopsy on 07/11/2018. No mediastinal mass or adenopathy. Unremarkable esophagus. Lungs/Pleura: Mild dependent atelectasis. The lungs are otherwise clear. Musculoskeletal: No acute fracture or aggressive appearing lytic or blastic osseous lesion. Review of the MIP images confirms the above findings. CTA ABDOMEN AND PELVIS FINDINGS VASCULAR Aorta: Scattered calcified atherosclerotic plaque. No evidence of dissection or aneurysm. Celiac: Mild to moderate narrowing of the proximal celiac axis with mild poststenotic dilation 21.4 cm. Questionable beaded appearance of the proximal celiac artery in the region of narrowing. Differential considerations include fibromuscular dysplasia versus median arcuate ligament compression. SMA: Patent without evidence of aneurysm, dissection, vasculitis or significant stenosis. Renals: Solitary left kidney. Minimal atherosclerotic plaque without stenosis, dissection, aneurysm or changes of fibromuscular dysplasia. IMA: Patent without evidence of aneurysm, dissection, vasculitis or significant stenosis. Inflow: Patent without evidence of aneurysm, dissection, vasculitis or significant stenosis. Veins: No obvious venous abnormality within the limitations of this arterial phase study. Review of the MIP images confirms the above findings. NON-VASCULAR Hepatobiliary: Stable wedge-shaped defect in the posterior periphery of hepatic segment 6 compared to prior imaging from January. No discrete hepatic lesion. Gallbladder is unremarkable. No intra or extrahepatic biliary ductal dilatation. Pancreas: Diffusely edematous appearance of the pancreas, particularly the pancreatic head. No ductal dilatation. Inflammatory stranding surrounds the entirety of the pancreas and extends into the porta  hepatis and mesenteric root. No pseudocyst or focal fluid collection. Spleen: Normal in size without focal abnormality. Adrenals/Urinary Tract: Normal adrenal glands. Absent right kidney. Left kidney is normal in caliber without evidence of hydronephrosis or nephrolithiasis. Circumscribed low-attenuation renal lesions consistent with cysts of varying complexity. No change  compared to prior imaging. Specifically, the intermediate attenuation cystic structure exophytic from the lateral lower pole of the left kidney demonstrates no significant interval change dating back to July of 2019. On prior MRI evaluation this was found to be most consistent with a complex proteinaceous/hemorrhagic cyst. Stomach/Bowel: 2 large duodenal diverticula are present arising from the D2 and D3 segments. Similar findings were seen previously. Secondary inflammatory changes noted along the gastric antrum and duodenum. Moderately large amount of formed stool in the rectum consistent with constipation. There are a few scattered colonic diverticula. Lymphatic: No suspicious lymphadenopathy. Reproductive: Prostate is unremarkable. Other: Fat containing left inguinal hernia.  No free fluid. Musculoskeletal: No acute fracture or aggressive appearing lytic or blastic osseous lesion. T12 and L1 compression fractures status post cement augmentation. Additional compression fracture at T7. No acute abnormality. Multilevel degenerative changes most significant at L5-S1. Review of the MIP images confirms the above findings. IMPRESSION: 1. CT findings most consistent with at least moderately severe acute pancreatitis. Diffusely edematous pancreas, particularly the pancreatic head with extensive inflammatory stranding in the peripancreatic soft tissues extending into the porta hepatis and mesenteric root. No evidence of pseudocyst, free fluid or vascular complication at this time. 2. Similar appearance of duodenal diverticula. 3. Additional ancillary  findings as above. Electronically Signed   By: Jacqulynn Cadet M.D.   On: 01/23/2021 06:26   US Abdomen Limited RUQ (LIVER/GB)  Result Date: 01/23/2021 CLINICAL DATA:  Right upper quadrant pain. EXAM: ULTRASOUND ABDOMEN LIMITED RIGHT UPPER QUADRANT COMPARISON:  Same day CT abdomen pelvis FINDINGS: Gallbladder: No gallstones or wall thickening visualized. No sonographic Murphy sign noted by sonographer. Common bile duct: Diameter: 5 mm Liver: No focal lesion identified. Within normal limits in parenchymal echogenicity. Portal vein is patent on color Doppler imaging with normal direction of blood flow towards the liver. Other: Trace perihepatic free fluid. IMPRESSION: 1. No visualized cholelithiasis or choledocholithiasis. 2. Trace perihepatic free fluid, likely related to patient's known pancreatitis. Electronically Signed   By: Dahlia Bailiff MD   On: 01/23/2021 11:28       Discharge Exam: Vitals:   01/26/21 1224 01/26/21 1606  BP: (!) 146/80 (!) 160/87  Pulse: 79 79  Resp:    Temp: 97.6 F (36.4 C) 97.9 F (36.6 C)  SpO2: 98% 97%    General exam: Appears calm and comfortable  Respiratory system: Clear to auscultation. Respiratory effort normal. Cardiovascular system: S1 & S2 heard, RRR. No pedal edema. Gastrointestinal system: Abdomen is nondistended, soft and nontender. Normal bowel sounds heard. Central nervous system: Alert. Non focal exam Extremities: Symmetric in appearance bilaterally  Skin: No rashes, lesions or ulcers on exposed skin  Psychiatry: Mood & affect appropriate.    The results of significant diagnostics from this hospitalization (including imaging, microbiology, ancillary and laboratory) are listed below for reference.     Microbiology: Recent Results (from the past 240 hour(s))  Resp Panel by RT-PCR (Flu A&B, Covid) Nasopharyngeal Swab     Status: None   Collection Time: 01/23/21  6:42 AM   Specimen: Nasopharyngeal Swab; Nasopharyngeal(NP) swabs in vial  transport medium  Result Value Ref Range Status   SARS Coronavirus 2 by RT PCR NEGATIVE NEGATIVE Final    Comment: (NOTE) SARS-CoV-2 target nucleic acids are NOT DETECTED.  The SARS-CoV-2 RNA is generally detectable in upper respiratory specimens during the acute phase of infection. The lowest concentration of SARS-CoV-2 viral copies this assay can detect is 138 copies/mL. A negative result does not preclude SARS-Cov-2  infection and should not be used as the sole basis for treatment or other patient management decisions. A negative result may occur with  improper specimen collection/handling, submission of specimen other than nasopharyngeal swab, presence of viral mutation(s) within the areas targeted by this assay, and inadequate number of viral copies(<138 copies/mL). A negative result must be combined with clinical observations, patient history, and epidemiological information. The expected result is Negative.  Fact Sheet for Patients:  EntrepreneurPulse.com.au  Fact Sheet for Healthcare Providers:  IncredibleEmployment.be  This test is no t yet approved or cleared by the [place] FDA and  has been authorized for detection and/or diagnosis of SARS-CoV-2 by FDA under an Emergency Use Authorization (EUA). This EUA will remain  in effect (meaning this test can be used) for the duration of the COVID-19 declaration under Section 564(b)(1) of the Act, 21 U.S.C.section 360bbb-3(b)(1), unless the authorization is terminated  or revoked sooner.       Influenza A by PCR NEGATIVE NEGATIVE Final   Influenza B by PCR NEGATIVE NEGATIVE Final    Comment: (NOTE) The Xpert Xpress SARS-CoV-2/FLU/RSV plus assay is intended as an aid in the diagnosis of influenza from Nasopharyngeal swab specimens and should not be used as a sole basis for treatment. Nasal washings and aspirates are unacceptable for Xpert Xpress SARS-CoV-2/FLU/RSV testing.  Fact  Sheet for Patients: EntrepreneurPulse.com.au  Fact Sheet for Healthcare Providers: IncredibleEmployment.be  This test is not yet approved or cleared by the [place] FDA and has been authorized for detection and/or diagnosis of SARS-CoV-2 by FDA under an Emergency Use Authorization (EUA). This EUA will remain in effect (meaning this test can be used) for the duration of the COVID-19 declaration under Section 564(b)(1) of the Act, 21 U.S.C. section 360bbb-3(b)(1), unless the authorization is terminated or revoked.  Performed at [facility], [address] [place], [place] [address]      Labs: BNP (last 3 results) No results for input(s): BNP in the last 8760 hours. Basic Metabolic Panel: Recent Labs  Lab 01/23/21 0305 01/24/21 0501 01/26/21 0604  NA 137 136 138  K 3.1* 4.0 3.2*  CL 104 107 106  CO2 26 23 23   GLUCOSE 96 77 86  BUN 28* 29* 12  CREATININE 1.08 0.93 0.73  CALCIUM 8.8* 7.8* 8.2*   Liver Function Tests: Recent Labs  Lab 01/23/21 0305 01/24/21 0501 01/26/21 0604  AST 350* 133* 27  ALT 17 55* 11  ALKPHOS 108 98 85  BILITOT 1.2 1.0 1.0  PROT 6.5 5.4* 5.8*  ALBUMIN 3.9 3.0* 3.1*   Recent Labs  Lab 01/23/21 0305 01/25/21 0558  LIPASE 3,721* 369*   No results for input(s): AMMONIA in the last 168 hours. CBC: Recent Labs  Lab 01/23/21 0305 01/24/21 0501  WBC 6.0 10.5  NEUTROABS 5.3  --   HGB 12.2* 11.7*  HCT 37.0* 34.7*  MCV 92.0 90.8  PLT 139* 123*   Cardiac Enzymes: No results for input(s): CKTOTAL, CKMB, CKMBINDEX, TROPONINI in the last 168 hours. BNP: Invalid input(s): POCBNP CBG: No results for input(s): GLUCAP in the last 168 hours. D-Dimer No results for input(s): DDIMER in the last 72 hours. Hgb A1c No results for input(s): HGBA1C in the last 72 hours. Lipid Profile Recent Labs    01/24/21 0501  CHOL 102  HDL 50  LDLCALC 44  TRIG 38  CHOLHDL 2.0   Thyroid function  studies No results for input(s): TSH, T4TOTAL, T3FREE, THYROIDAB in the last 72 hours.  Invalid input(s): FREET3 Anemia work up No results for input(s): VITAMINB12, FOLATE, FERRITIN, TIBC, IRON, RETICCTPCT in the last 72 hours. Urinalysis    Component Value Date/Time   COLORURINE YELLOW (A) 01/23/2021 0438   APPEARANCEUR CLEAR (A) 01/23/2021 0438   LABSPEC 1.019 01/23/2021 0438   PHURINE 6.0 01/23/2021 0438   GLUCOSEU NEGATIVE 01/23/2021 0438   HGBUR NEGATIVE 01/23/2021 0438   BILIRUBINUR NEGATIVE 01/23/2021 0438   KETONESUR 5 (A) 01/23/2021 0438   PROTEINUR 100 (A) 01/23/2021 0438   NITRITE NEGATIVE 01/23/2021 0438   LEUKOCYTESUR NEGATIVE 01/23/2021 0438   Sepsis Labs Invalid input(s): PROCALCITONIN,  WBC,  LACTICIDVEN Microbiology Recent Results (from the past 240 hour(s))  Resp Panel by RT-PCR (Flu A&B, Covid) Nasopharyngeal Swab     Status: None   Collection Time: 01/23/21  6:42 AM   Specimen: Nasopharyngeal Swab; Nasopharyngeal(NP) swabs in vial transport medium  Result Value Ref Range Status   SARS Coronavirus 2 by RT PCR NEGATIVE NEGATIVE Final    Comment: (NOTE) SARS-CoV-2 target nucleic acids are NOT DETECTED.  The SARS-CoV-2 RNA is generally detectable in upper respiratory specimens during the acute phase of infection. The lowest concentration of SARS-CoV-2 viral copies this assay can detect is 138 copies/mL. A negative result does not preclude SARS-Cov-2 infection and should not be used as the sole basis for treatment or other patient management decisions. A negative result may occur with  improper specimen collection/handling, submission of specimen other than nasopharyngeal swab, presence of viral mutation(s) within the areas targeted by this assay, and inadequate number of viral copies(<138 copies/mL). A negative result must be combined with clinical observations, patient history, and epidemiological information. The expected result is Negative.  Fact Sheet  for Patients:  EntrepreneurPulse.com.au  Fact Sheet for Healthcare Providers:  IncredibleEmployment.be  This test is no t yet approved or cleared by the [place] FDA and  has been authorized for detection and/or diagnosis of SARS-CoV-2 by FDA under an Emergency Use Authorization (EUA). This EUA will remain  in effect (meaning this test can be used) for the duration of the COVID-19 declaration under Section 564(b)(1) of the Act, 21 U.S.C.section 360bbb-3(b)(1), unless the authorization is terminated  or revoked sooner.       Influenza A by PCR NEGATIVE NEGATIVE Final   Influenza B by PCR NEGATIVE NEGATIVE Final    Comment: (NOTE) The Xpert Xpress SARS-CoV-2/FLU/RSV plus assay is intended as an aid in the diagnosis of influenza from Nasopharyngeal swab specimens and should not be used as a sole basis for treatment. Nasal washings and aspirates are unacceptable for Xpert Xpress SARS-CoV-2/FLU/RSV testing.  Fact Sheet for Patients: EntrepreneurPulse.com.au  Fact Sheet for Healthcare Providers: IncredibleEmployment.be  This test is not yet approved or cleared by the [place] FDA and has been authorized for detection and/or diagnosis of SARS-CoV-2 by FDA under an Emergency Use Authorization (EUA). This EUA will remain in effect (meaning this test can be used) for the duration of the COVID-19 declaration under Section 564(b)(1) of the Act, 21 U.S.C. section 360bbb-3(b)(1), unless the authorization is terminated or revoked.  Performed at [facility], [address] [place], [place] [address]      Patient was seen and examined on the day of discharge and was found to be in stable condition. Time coordinating discharge: 35 minutes including assessment and coordination of care, as well as examination of the patient.   SIGNED:  Dessa Phi, DO [facility] 01/26/2021, 4:21 PM

## 2021-01-26 NOTE — TOC Transition Note (Signed)
Transition of Care [facility]) - CM/SW Discharge Note   Patient Details  Name: Kevin [name] MRN: [id number] Date of Birth: 05-12-1933  [facility] CM/SW Contact:  Candie Chroman, LCSW Phone Number: 01/26/2021, 4:29 PM   Clinical Narrative:  Patient walked 300 feet min assist with PT today. CSW discussed with SNF admissions. It's highly unlikely that [facility] will approve rehab at this point. Wife prefers to take him home today rather than waiting over the weekend to see what the insurance decides. [facility] admissions coordinator will arrange home health PT and OT. Faxed her the orders. Per patient's wife, no DME needs. Patient has 14 respite days at [facility] that he can use if needed. No further concerns. Patient has orders to discharge home today. His wife plans to drive him home. CSW signing off.  Final next level of care: [place] Barriers to Discharge: Barriers Resolved   Patient Goals and CMS Choice     Choice offered to / list presented to : NA  Discharge Placement                Patient to be transferred to facility by: Wife will drive him home. Name of family member notified: Ignacio Lowder Patient and family notified of of transfer: 01/26/21  Discharge Plan and Services     Post Acute Care Choice: [place]                               Social Determinants of Health (SDOH) Interventions     Readmission Risk Interventions No flowsheet data found.

## 2021-01-26 NOTE — Progress Notes (Signed)
Physical Therapy Treatment Patient Details Name: Kevin [name] MRN: [id number] DOB: 1933-10-05 Today's Date: 01/26/2021    History of Present Illness Pt admitted for acute pancreatitis with complaints of substernal chest pain and nausea. History includes Parkinson's disease, macular degeneration, dementia, and HTN    PT Comments    Pt is making improved progress towards goals with improved ambulation distance, however still presents safety deficits. Pt isn't safe to ambulate without hands on assist at all times and remains a high fall risk. Lacks safety awareness. Partial ambulation performed without AD with HHA with slight improvement in balance. Will continue to progress as able.   Follow Up Recommendations  SNF     Equipment Recommendations  None recommended by PT    Recommendations for Other Services       Precautions / Restrictions Precautions Precautions: Fall Restrictions Weight Bearing Restrictions: No    Mobility  Bed Mobility Overal bed mobility: Needs Assistance Bed Mobility: Supine to Sit     Supine to sit: Min guard     General bed mobility comments: impulsive, needs slight assist with B LE management    Transfers Overall transfer level: Needs assistance Equipment used: None Transfers: Sit to/from Stand Sit to Stand: Min guard         General transfer comment: impulsive  Ambulation/Gait Ambulation/Gait assistance: Min assist Gait Distance (Feet): 300 Feet Assistive device: Rolling walker (2 wheeled) Gait Pattern/deviations: Festinating;Trunk flexed;Narrow base of support     General Gait Details: ambulated around RN station. Needs hands on assist for guidance of RW and safety. Impulsive with quick speed. Needs constant hands on assist to prevent falls. Poor safety awareness. Additional person required to manage equipment during mobility efforts. Last 20' performed without AD and HHA.   Stairs             Wheelchair Mobility     Modified Rankin (Stroke Patients Only)       Balance Overall balance assessment: Needs assistance Sitting-balance support: No upper extremity supported Sitting balance-Leahy Scale: Good     Standing balance support: Bilateral upper extremity supported Standing balance-Leahy Scale: Fair                              Cognition Arousal/Alertness: Awake/alert Behavior During Therapy: Impulsive Overall Cognitive Status: Impaired/Different from baseline                                 General Comments: A&O x 2, however still is impulsive      Exercises      General Comments        Pertinent Vitals/Pain Pain Assessment: No/denies pain    Home Living                      Prior Function            PT Goals (current goals can now be found in the care plan section) Acute Rehab PT Goals Patient Stated Goal: unable to state PT Goal Formulation: Patient unable to participate in goal setting Time For Goal Achievement: 02/07/21 Potential to Achieve Goals: Fair Progress towards PT goals: Progressing toward goals    Frequency    Min 2X/week      PT Plan Current plan remains appropriate    Co-evaluation              AM-PAC  PT "6 Clicks" Mobility   Outcome Measure  Help needed turning from your back to your side while in a flat bed without using bedrails?: None Help needed moving from lying on your back to sitting on the side of a flat bed without using bedrails?: None Help needed moving to and from a bed to a chair (including a wheelchair)?: A Little Help needed standing up from a chair using your arms (e.g., wheelchair or bedside chair)?: A Little Help needed to walk in hospital room?: A Little Help needed climbing 3-5 steps with a railing? : A Lot 6 Click Score: 19    End of Session Equipment Utilized During Treatment: Gait belt Activity Tolerance: Patient tolerated treatment well Patient left: in bed (with sitter and  wife in room) Nurse Communication: Mobility status PT Visit Diagnosis: Difficulty in walking, not elsewhere classified (R26.2);Unsteadiness on feet (R26.81);Muscle weakness (generalized) (M62.81)     Time: 5093-2671 PT Time Calculation (min) (ACUTE ONLY): 13 min  Charges:  $Gait Training: 8-22 mins                     Greggory Stallion, [place], DPT [phone]    Zaden Sako 01/26/2021, 11:53 AM

## 2021-01-26 NOTE — TOC Progression Note (Addendum)
Transition of Care [facility]) - Progression Note    Patient Details  Name: Kevin [name] MRN: [id number] Date of Birth: September 17, 1933  [facility] CM/SW Contact  Candie Chroman, LCSW Phone Number: 01/26/2021, 9:22 AM  Clinical Narrative:  PASARR obtained: [id number] H.   12:27 pm: [facility] did not start auth yesterday. [facility] admissions coordinator started auth this morning. Precert case # [id number].  Expected Discharge Plan: Skilled Nursing Facility Barriers to Discharge: [place] Work up,Requiring sitter/restraints  Expected Discharge Plan and Services Expected Discharge Plan: [place] Choice: [place] arrangements for the past 2 months: [place]                                       Social Determinants of Health (SDOH) Interventions    Readmission Risk Interventions No flowsheet data found.

## 2021-01-26 NOTE — Plan of Care (Signed)
  Problem: Clinical Measurements: Goal: Ability to maintain clinical measurements within normal limits will improve Outcome: Progressing Goal: Will remain free from infection Outcome: Progressing Goal: Diagnostic test results will improve Outcome: Progressing Goal: Respiratory complications will improve Outcome: Progressing Goal: Cardiovascular complication will be avoided Outcome: Progressing   Problem: Pain Managment: Goal: General experience of comfort will improve Outcome: Progressing   Pt alert and oriented to self. Pt is slightly agitated last night but was better after Carbidopa was given. Wife at [facility]. BP elevated; hydralazine IV given. BM noted 2x. Denies any pain. Fall precautions kept in place.

## 2021-01-31 ENCOUNTER — Inpatient Hospital Stay: Payer: Medicare HMO | Attending: Oncology

## 2021-01-31 DIAGNOSIS — E538 Deficiency of other specified B group vitamins: Secondary | ICD-10-CM | POA: Insufficient documentation

## 2021-01-31 DIAGNOSIS — K219 Gastro-esophageal reflux disease without esophagitis: Secondary | ICD-10-CM | POA: Insufficient documentation

## 2021-01-31 DIAGNOSIS — F0280 Dementia in other diseases classified elsewhere without behavioral disturbance: Secondary | ICD-10-CM | POA: Insufficient documentation

## 2021-01-31 DIAGNOSIS — D61818 Other pancytopenia: Secondary | ICD-10-CM | POA: Insufficient documentation

## 2021-01-31 DIAGNOSIS — J45909 Unspecified asthma, uncomplicated: Secondary | ICD-10-CM | POA: Insufficient documentation

## 2021-01-31 DIAGNOSIS — Z79899 Other long term (current) drug therapy: Secondary | ICD-10-CM | POA: Insufficient documentation

## 2021-01-31 DIAGNOSIS — I10 Essential (primary) hypertension: Secondary | ICD-10-CM | POA: Insufficient documentation

## 2021-01-31 DIAGNOSIS — E785 Hyperlipidemia, unspecified: Secondary | ICD-10-CM | POA: Insufficient documentation

## 2021-01-31 DIAGNOSIS — Z803 Family history of malignant neoplasm of breast: Secondary | ICD-10-CM | POA: Insufficient documentation

## 2021-01-31 DIAGNOSIS — G20 Parkinson's disease: Secondary | ICD-10-CM | POA: Insufficient documentation

## 2021-01-31 DIAGNOSIS — Z86010 Personal history of colonic polyps: Secondary | ICD-10-CM | POA: Insufficient documentation

## 2021-01-31 DIAGNOSIS — Z800 Family history of malignant neoplasm of digestive organs: Secondary | ICD-10-CM | POA: Insufficient documentation

## 2021-01-31 DIAGNOSIS — R634 Abnormal weight loss: Secondary | ICD-10-CM | POA: Insufficient documentation

## 2021-02-02 ENCOUNTER — Other Ambulatory Visit: Payer: Self-pay

## 2021-02-02 DIAGNOSIS — E538 Deficiency of other specified B group vitamins: Secondary | ICD-10-CM

## 2021-02-03 ENCOUNTER — Encounter: Payer: Self-pay | Admitting: Oncology

## 2021-02-03 ENCOUNTER — Inpatient Hospital Stay (HOSPITAL_BASED_OUTPATIENT_CLINIC_OR_DEPARTMENT_OTHER): Payer: Medicare HMO | Admitting: Oncology

## 2021-02-03 ENCOUNTER — Inpatient Hospital Stay: Payer: Medicare HMO

## 2021-02-03 VITALS — BP 104/63 | HR 80 | Temp 97.6°F | Resp 16 | Wt 154.0 lb

## 2021-02-03 DIAGNOSIS — Z79899 Other long term (current) drug therapy: Secondary | ICD-10-CM | POA: Diagnosis not present

## 2021-02-03 DIAGNOSIS — E538 Deficiency of other specified B group vitamins: Secondary | ICD-10-CM

## 2021-02-03 DIAGNOSIS — D696 Thrombocytopenia, unspecified: Secondary | ICD-10-CM

## 2021-02-03 DIAGNOSIS — F0280 Dementia in other diseases classified elsewhere without behavioral disturbance: Secondary | ICD-10-CM | POA: Diagnosis not present

## 2021-02-03 DIAGNOSIS — D61818 Other pancytopenia: Secondary | ICD-10-CM | POA: Diagnosis not present

## 2021-02-03 DIAGNOSIS — J45909 Unspecified asthma, uncomplicated: Secondary | ICD-10-CM | POA: Diagnosis not present

## 2021-02-03 DIAGNOSIS — D649 Anemia, unspecified: Secondary | ICD-10-CM | POA: Diagnosis not present

## 2021-02-03 DIAGNOSIS — E785 Hyperlipidemia, unspecified: Secondary | ICD-10-CM | POA: Diagnosis not present

## 2021-02-03 DIAGNOSIS — K219 Gastro-esophageal reflux disease without esophagitis: Secondary | ICD-10-CM | POA: Diagnosis not present

## 2021-02-03 DIAGNOSIS — Z803 Family history of malignant neoplasm of breast: Secondary | ICD-10-CM | POA: Diagnosis not present

## 2021-02-03 DIAGNOSIS — Z86010 Personal history of colonic polyps: Secondary | ICD-10-CM | POA: Diagnosis not present

## 2021-02-03 DIAGNOSIS — G20 Parkinson's disease: Secondary | ICD-10-CM | POA: Diagnosis not present

## 2021-02-03 DIAGNOSIS — R634 Abnormal weight loss: Secondary | ICD-10-CM | POA: Diagnosis not present

## 2021-02-03 DIAGNOSIS — I10 Essential (primary) hypertension: Secondary | ICD-10-CM | POA: Diagnosis not present

## 2021-02-03 DIAGNOSIS — Z800 Family history of malignant neoplasm of digestive organs: Secondary | ICD-10-CM | POA: Diagnosis not present

## 2021-02-03 LAB — CBC WITH DIFFERENTIAL/PLATELET
Abs Immature Granulocytes: 0.1 10*3/uL — ABNORMAL HIGH (ref 0.00–0.07)
Basophils Absolute: 0 10*3/uL (ref 0.0–0.1)
Basophils Relative: 1 %
Eosinophils Absolute: 0.1 10*3/uL (ref 0.0–0.5)
Eosinophils Relative: 3 %
HCT: 33.5 % — ABNORMAL LOW (ref 39.0–52.0)
Hemoglobin: 11.2 g/dL — ABNORMAL LOW (ref 13.0–17.0)
Immature Granulocytes: 2 %
Lymphocytes Relative: 10 %
Lymphs Abs: 0.6 10*3/uL — ABNORMAL LOW (ref 0.7–4.0)
MCH: 30.4 pg (ref 26.0–34.0)
MCHC: 33.4 g/dL (ref 30.0–36.0)
MCV: 90.8 fL (ref 80.0–100.0)
Monocytes Absolute: 0.5 10*3/uL (ref 0.1–1.0)
Monocytes Relative: 9 %
Neutro Abs: 4.3 10*3/uL (ref 1.7–7.7)
Neutrophils Relative %: 75 %
Platelets: 300 10*3/uL (ref 150–400)
RBC: 3.69 MIL/uL — ABNORMAL LOW (ref 4.22–5.81)
RDW: 13 % (ref 11.5–15.5)
WBC: 5.7 10*3/uL (ref 4.0–10.5)
nRBC: 0 % (ref 0.0–0.2)

## 2021-02-03 LAB — VITAMIN B12: Vitamin B-12: 879 pg/mL (ref 180–914)

## 2021-02-03 MED ORDER — CYANOCOBALAMIN 1000 MCG/ML IJ SOLN
1000.0000 ug | Freq: Once | INTRAMUSCULAR | Status: AC
  Administered 2021-02-03: 1000 ug via INTRAMUSCULAR

## 2021-02-03 MED ORDER — POTASSIUM CHLORIDE CRYS ER 20 MEQ PO TBCR: 20.0000 meq | EXTENDED_RELEASE_TABLET | Freq: Every day | ORAL | 0 refills | Status: DC

## 2021-02-03 NOTE — Progress Notes (Signed)
Hematology/Oncology follow up note [facility] [phone] [phone]   Patient Care Team: Baxter Hire, MD as PCP - General (Internal Medicine)  REFERRING PROVIDER: Denice Paradise CHIEF COMPLAINTS/REASON FOR VISIT:  Evaluation of pancytopenia  HISTORY OF PRESENTING ILLNESS:  Kevin [name] is a  87 y.o.  male with PMH listed below who was referred to me for evaluation of kidney mass.  Patient was recently seen by gastroenterology Dr. Vira Agar at the request of patient's primary care physician for consultation for evaluation and management of weight loss, history of colon polyp and a family history of colon cancer. Patient reports that he has lost 20 pounds for the past several years.  May be 4 pounds over the past 6 months. Reports that patient does not eat much and try to eat healthy food. There appears to be some intentional cutting back on calories.  Patient reports he is not as hungry as he used to be. Patient remains very active for his age.  He goes to gym 3 times a week.  Denies any abdominal pain, diarrhea, constipation change of bowel habits.  Denies seeing any blood in the stool. Denies drenching night sweats, fever or chills. Per GI note, last colonoscopy was in 2012 which reviewed benign polyps and internal hemorrhoids, small area of erosion in the cecum biopsy showed chronic active colitis.  Due to patient's age, he is not quite interested to have colonoscopy done and prefers to do noninvasive test first.  So a CT abdomen Pelvis with contrast was obtained.  CT image showed a 6.7 cm mass in the upper pole of the right kidney and a 1.5 cm mass in the lower pole of the left kidney.  Both suspicious for renal cell carcinoma. There is no evidence of metastatic disease within the abdomen or pelvis. Also noted large duodenal diverticulum, mildly enlarged prostate and aortic athero-sclerosis. Patient denies any flank pain, or  hematuria. Family history reviewed. Two sisters diagnosed with breast cancer.   Reviewed patient's previous lab work in [facility].  TSH was checked on 01/31/2017, normal.  PSA 2.05,   # 11/17/2018 Patient underwent right nephrectomy which showed RCC,  clear cell type.  Patient did not follow-up since his surgery.He follows up with Dr. Erlene Quan and has had surveillance images done via urology office. 06/02/2020, CT abdomen pelvis showed no evidence of recurrence or metastatic disease.  Colonic diverticulosis.  Stable liver lesions, better characterize as benign hemangioma on previous MRI liver.  Moderately enlarged prostate.  Aortic atherosclerosis.  # blood work done on 09/19/2020 which showed leukopenia with WBC count of 3.2, anemia with hemoglobin 12.5, Platelet count 1 26,000.  Patient was referred back to me for evaluation of pancytopenia  INTERVAL HISTORY Kevin [name] is a 87 y.o. male presents to reestablish care for evaluation of pancytopenia. Problems and complaints are listed below: Patient was accompanied by wife.  He has Parkinson's disease and does not talk much.  Main history was provided by wife Patient was recently admitted due to pancreatitis.  He does not drink alcohol, no gallstone.  Etiology unclear. He has lost some weight due to recent hospitalization.  Otherwise doing well.  Per wife, he is recovering . Review of Systems  Constitutional: Positive for malaise/fatigue and weight loss. Negative for chills and fever.  HENT: Negative for nosebleeds and sore throat.   Eyes: Negative for double vision, photophobia and redness.  Respiratory: Negative for cough, shortness of breath and wheezing.   Cardiovascular: Negative  for chest pain, palpitations and orthopnea.  Gastrointestinal: Negative for abdominal pain, blood in stool, nausea and vomiting.  Genitourinary: Negative for dysuria.  Musculoskeletal: Negative for back pain, myalgias and neck pain.  Skin: Negative for itching and  rash.  Neurological: Negative for dizziness, tingling and tremors.  Endo/Heme/Allergies: Negative for environmental allergies. Does not bruise/bleed easily.  Psychiatric/Behavioral: Negative for depression.    MEDICAL HISTORY:  Past Medical History:  Diagnosis Date  . Anemia    in past  . Asthma    none in years  . Broken arm    Pt was 19  . 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 Cataract cortical, senile   . Colon polyps   . Dementia ([place])    Parkinson's, loss of short term memory  . E coli infection 2020   blood stream  . GERD (gastroesophageal reflux disease)   . HOH (hard of hearing)    extremely HOH, wears aids  . Hyperlipidemia   . Hypertension   . Osteoporosis   . Pneumonia    AS A CHILD    SURGICAL HISTORY: Past Surgical History:  Procedure Laterality Date  . BACK SURGERY    . CATARACT EXTRACTION W/PHACO Left 05/16/2020   Procedure: CATARACT EXTRACTION PHACO AND INTRAOCULAR LENS PLACEMENT (IOC) LEFT 7.27 00:39.0 ;  Surgeon: Birder Robson, MD;  Location: [place];  Service: Ophthalmology;  Laterality: Left;  . CATARACT EXTRACTION W/PHACO Right 06/07/2020   Procedure: CATARACT EXTRACTION PHACO AND INTRAOCULAR LENS PLACEMENT (IOC) RIGHT 11.56 00:54.9;  Surgeon: Birder Robson, MD;  Location: [place];  Service: Ophthalmology;  Laterality: Right;  . COLONOSCOPY  2001, 2006, 2012  . ESOPHAGOGASTRODUODENOSCOPY  09/11/2000  . FLEXIBLE SIGMOIDOSCOPY  03/02/1997  . KYPHOPLASTY N/A 11/13/2019   Procedure: T12 KYPHOPLASTY;  Surgeon: Hessie Knows, MD;  Location: [facility];  Service: Orthopedics;  Laterality: N/A;  . KYPHOPLASTY N/A 12/03/2019   Procedure: L1 KYPHOPLASTY;  Surgeon: Hessie Knows, MD;  Location: [facility];  Service: Orthopedics;  Laterality: N/A;  . NASAL POLYP EXCISION    . NEPHRECTOMY    . ROBOT ASSISTED LAPAROSCOPIC NEPHRECTOMY Right 11/17/2018   Procedure: ROBOTIC ASSISTED LAPAROSCOPIC NEPHRECTOMY;   Surgeon: Hollice Espy, MD;  Location: [facility];  Service: Urology;  Laterality: Right;    SOCIAL HISTORY: Social History   Socioeconomic History  . Marital status: Married    Spouse name: [name]   . Number of children: 1  . Years of education: Not on file  . Highest education level: Not on file  Occupational History  . Occupation: retired    Comment: Network engineer.  Tobacco Use  . Smoking status: Never Smoker  . Smokeless tobacco: Never Used  Vaping Use  . Vaping Use: Never used  Substance and Sexual Activity  . Alcohol use: Never  . Drug use: Never  . Sexual activity: Not on file  Other Topics Concern  . Not on file  Social History Narrative  . Not on file   Social Determinants of Health   Financial Resource Strain: Not on file  Food Insecurity: Not on file  Transportation Needs: Not on file  Physical Activity: Not on file  Stress: Not on file  Social Connections: Not on file  Intimate Partner Violence: Not on file    FAMILY HISTORY: Family History  Problem Relation Age of Onset  . Heart disease Mother   . Pancreatitis Mother   . Emphysema Father   . Breast  cancer Sister   . Breast cancer Sister     ALLERGIES:  has No Known Allergies.  MEDICATIONS:  Current Outpatient Medications  Medication Sig Dispense Refill  . alendronate (FOSAMAX) 70 MG tablet Take 70 mg by mouth once a week. Saturday    . amLODipine (NORVASC) 5 MG tablet Take 2 tablets (10 mg total) by mouth daily. 60 tablet 1  . atorvastatin (LIPITOR) 20 MG tablet Take 1 tablet (20 mg total) by mouth daily. 30 tablet 2  . carbidopa-levodopa (SINEMET IR) 25-100 MG tablet Take by mouth See admin instructions. Take 1.5 tablet (0800) by mouth in the morning, then 1.5 tablet (1200) by mouth at noon, then 1.5 tablet by mouth at 5 pm (1700), then 1 tablet by mouth at bedtime (2200), then 1 tablet at 0300.    Marland Kitchen escitalopram (LEXAPRO) 10 MG tablet Take 10 mg by mouth daily.    . Multiple Vitamins-Minerals  (PRESERVISION AREDS 2 PO) Take 1 tablet by mouth daily.    . potassium chloride SA (KLOR-CON) 20 MEQ tablet Take 1 tablet (20 mEq total) by mouth daily. 7 tablet 0  . rivastigmine (EXELON) 3 MG capsule Take 3 mg by mouth 2 (two) times daily.    . vitamin B-12 (CYANOCOBALAMIN) 1000 MCG tablet Take 1 tablet (1,000 mcg total) by mouth daily. 90 tablet 1   No current facility-administered medications for this visit.     PHYSICAL EXAMINATION: ECOG PERFORMANCE STATUS: 1 - Symptomatic but completely ambulatory Vitals:   02/03/21 0944  BP: 104/63  Pulse: 80  Resp: 16  Temp: 97.6 F (36.4 C)   Filed Weights   02/03/21 0944  Weight: 154 lb (69.9 kg)    Physical Exam Constitutional:      General: He is not in acute distress.    Appearance: He is well-developed.     Comments: Frail, walks independently  HENT:     Head: Normocephalic and atraumatic.     Right Ear: External ear normal.     Left Ear: External ear normal.  Eyes:     General: No scleral icterus.    Conjunctiva/sclera: Conjunctivae normal.     Pupils: Pupils are equal, round, and reactive to light.  Cardiovascular:     Rate and Rhythm: Normal rate and regular rhythm.     Heart sounds: Normal heart sounds.  Pulmonary:     Effort: Pulmonary effort is normal. No respiratory distress.     Breath sounds: Normal breath sounds. No wheezing or rales.  Chest:     Chest wall: No tenderness.  Abdominal:     General: Bowel sounds are normal. There is no distension.     Palpations: Abdomen is soft. There is no mass.     Tenderness: There is no abdominal tenderness.  Musculoskeletal:        General: No deformity. Normal range of motion.     Cervical back: Normal range of motion and neck supple.  Lymphadenopathy:     Cervical: No cervical adenopathy.  Skin:    General: Skin is warm and dry.     Findings: No rash.  Neurological:     Mental Status: He is alert. Mental status is at baseline.     Cranial Nerves: No cranial  nerve deficit.     Coordination: Coordination normal.      LABORATORY DATA:  I have reviewed the data as listed Lab Results  Component Value Date   WBC 5.7 02/03/2021   HGB 11.2 (L) 02/03/2021  HCT 33.5 (L) 02/03/2021   MCV 90.8 02/03/2021   PLT 300 02/03/2021   Recent Labs    10/17/20 1517 11/09/20 1504 01/23/21 0305 01/24/21 0501 01/26/21 0604  NA  --   --  137 136 138  K  --   --  3.1* 4.0 3.2*  CL  --   --  104 107 106  CO2  --   --  26 23 23   GLUCOSE  --   --  96 77 86  BUN  --   --  28* 29* 12  CREATININE  --    < > 1.08 0.93 0.73  CALCIUM  --   --  8.8* 7.8* 8.2*  GFRNONAA  --   --  >60 >60 >60  PROT 6.7  --  6.5 5.4* 5.8*  ALBUMIN 4.1  --  3.9 3.0* 3.1*  AST 13*  --  350* 133* 27  ALT 5  --  17 55* 11  ALKPHOS 51  --  108 98 85  BILITOT 0.6  --  1.2 1.0 1.0  BILIDIR <0.1  --   --  0.3*  --   IBILI NOT CALCULATED  --   --  0.7  --    < > = values in this interval not displayed.   Iron/TIBC/Ferritin/ %Sat    Component Value Date/Time   FERRITIN 282 03/04/2019 0413        ASSESSMENT & PLAN:  1. Vitamin B12 deficiency   2. Thrombocytopenia ([place])   3. Anemia, unspecified type    # Normocytic anemia, hemoglobin 11.2, slightly decreased, likely due to recent frequent blood draw while hospitalized. #Thrombocytopenia, resolved.  Could be reactive secondary to recent inflammation .#Vitamin B12 deficiency, B12 level is pending during today's visit.  Patient did not get blood work done prior to this visit. Empiric vitamin B12 injection x1 today.  Level came back at 879, significantly improved. Recommend patient to continue oral vitamin B12 supplementation.  History of RCC, continue follow-up with urology.   Orders Placed This Encounter  Procedures  . CBC with Differential/Platelet    Standing Status:   Future    Standing Expiration Date:   02/03/2022  . Comprehensive metabolic panel    Standing Status:   Future    Standing Expiration Date:    02/03/2022  . Vitamin B12    Standing Status:   Future    Standing Expiration Date:   02/03/2022    All questions were answered. The patient knows to call the clinic with any problems questions or concerns.  Return of visit: 4 months.  Earlie Server, MD, PhD Hematology Oncology [facility] at [facility] Pager- [phone] 02/03/2021

## 2021-02-03 NOTE — Progress Notes (Signed)
Patient had recent hospitalization due to pancreatitis.

## 2021-02-06 LAB — KAPPA/LAMBDA LIGHT CHAINS
Kappa free light chain: 42.9 mg/L — ABNORMAL HIGH (ref 3.3–19.4)
Kappa, lambda light chain ratio: 1.59 (ref 0.26–1.65)
Lambda free light chains: 26.9 mg/L — ABNORMAL HIGH (ref 5.7–26.3)

## 2021-02-20 ENCOUNTER — Other Ambulatory Visit: Payer: Self-pay | Admitting: Internal Medicine

## 2021-02-20 DIAGNOSIS — Y92009 Unspecified place in unspecified non-institutional (private) residence as the place of occurrence of the external cause: Secondary | ICD-10-CM

## 2021-02-20 DIAGNOSIS — S22070A Wedge compression fracture of T9-T10 vertebra, initial encounter for closed fracture: Secondary | ICD-10-CM

## 2021-02-20 DIAGNOSIS — W19XXXA Unspecified fall, initial encounter: Secondary | ICD-10-CM

## 2021-02-23 ENCOUNTER — Other Ambulatory Visit: Payer: Self-pay

## 2021-02-23 ENCOUNTER — Ambulatory Visit
Admission: RE | Admit: 2021-02-23 | Discharge: 2021-02-23 | Disposition: A | Discharge status: Home / Self Care | Payer: Medicare HMO | Source: Ambulatory Visit | Attending: Internal Medicine | Admitting: Internal Medicine

## 2021-02-23 DIAGNOSIS — Y92009 Unspecified place in unspecified non-institutional (private) residence as the place of occurrence of the external cause: Secondary | ICD-10-CM | POA: Insufficient documentation

## 2021-02-23 DIAGNOSIS — S22070A Wedge compression fracture of T9-T10 vertebra, initial encounter for closed fracture: Secondary | ICD-10-CM | POA: Insufficient documentation

## 2021-02-23 DIAGNOSIS — W19XXXA Unspecified fall, initial encounter: Secondary | ICD-10-CM | POA: Insufficient documentation

## 2021-02-27 ENCOUNTER — Other Ambulatory Visit: Payer: Self-pay | Admitting: Orthopedic Surgery

## 2021-02-27 MED ORDER — CHLORHEXIDINE GLUCONATE 0.12 % MT SOLN: 15.0000 mL | Freq: Once | OROMUCOSAL | Status: AC

## 2021-02-27 MED ORDER — CEFAZOLIN SODIUM-DEXTROSE 2-4 GM/100ML-% IV SOLN
2.0000 g | INTRAVENOUS | Status: AC
  Administered 2021-02-28: 2 g via INTRAVENOUS

## 2021-02-27 MED ORDER — FAMOTIDINE 20 MG PO TABS: 20.0000 mg | ORAL_TABLET | Freq: Once | ORAL | Status: AC

## 2021-02-27 MED ORDER — LACTATED RINGERS IV SOLN: INTRAVENOUS | Status: DC

## 2021-02-27 MED ORDER — ORAL CARE MOUTH RINSE: 15.0000 mL | Freq: Once | OROMUCOSAL | Status: AC

## 2021-02-28 ENCOUNTER — Ambulatory Visit: Payer: Medicare HMO | Admitting: Anesthesiology

## 2021-02-28 ENCOUNTER — Encounter
Admission: RE | Disposition: A | Discharge status: Home / Self Care | Payer: Self-pay | Source: Home / Self Care | Attending: Orthopedic Surgery

## 2021-02-28 ENCOUNTER — Ambulatory Visit: Payer: Medicare HMO

## 2021-02-28 ENCOUNTER — Ambulatory Visit
Admission: RE | Admit: 2021-02-28 | Discharge: 2021-02-28 | Disposition: A | Discharge status: Home / Self Care | Payer: Medicare HMO | Attending: Orthopedic Surgery | Admitting: Orthopedic Surgery

## 2021-02-28 ENCOUNTER — Encounter: Payer: Self-pay | Admitting: Orthopedic Surgery

## 2021-02-28 ENCOUNTER — Other Ambulatory Visit: Payer: Self-pay

## 2021-02-28 DIAGNOSIS — S22070A Wedge compression fracture of T9-T10 vertebra, initial encounter for closed fracture: Secondary | ICD-10-CM | POA: Insufficient documentation

## 2021-02-28 DIAGNOSIS — Z7983 Long term (current) use of bisphosphonates: Secondary | ICD-10-CM | POA: Diagnosis not present

## 2021-02-28 DIAGNOSIS — Z79899 Other long term (current) drug therapy: Secondary | ICD-10-CM | POA: Insufficient documentation

## 2021-02-28 DIAGNOSIS — Z9181 History of falling: Secondary | ICD-10-CM | POA: Insufficient documentation

## 2021-02-28 DIAGNOSIS — X509XXA Other and unspecified overexertion or strenuous movements or postures, initial encounter: Secondary | ICD-10-CM | POA: Insufficient documentation

## 2021-02-28 DIAGNOSIS — M8088XA Other osteoporosis with current pathological fracture, vertebra(e), initial encounter for fracture: Secondary | ICD-10-CM | POA: Insufficient documentation

## 2021-02-28 DIAGNOSIS — Z419 Encounter for procedure for purposes other than remedying health state, unspecified: Secondary | ICD-10-CM

## 2021-02-28 HISTORY — PX: KYPHOPLASTY: SHX5884

## 2021-02-28 LAB — BASIC METABOLIC PANEL
Anion gap: 9 (ref 5–15)
BUN: 20 mg/dL (ref 8–23)
CO2: 24 mmol/L (ref 22–32)
Calcium: 9 mg/dL (ref 8.9–10.3)
Chloride: 100 mmol/L (ref 98–111)
Creatinine, Ser: 0.91 mg/dL (ref 0.61–1.24)
GFR, Estimated: >60 mL/min (ref >60)
Glucose, Bld: 96 mg/dL (ref 70–99)
Potassium: 4.2 mmol/L (ref 3.5–5.1)
Sodium: 133 mmol/L — ABNORMAL LOW (ref 135–145)

## 2021-02-28 LAB — CBC
HCT: 32.5 % — ABNORMAL LOW (ref 39.0–52.0)
Hemoglobin: 10.9 g/dL — ABNORMAL LOW (ref 13.0–17.0)
MCH: 30.4 pg (ref 26.0–34.0)
MCHC: 33.5 g/dL (ref 30.0–36.0)
MCV: 90.5 fL (ref 80.0–100.0)
Platelets: 203 10*3/uL (ref 150–400)
RBC: 3.59 MIL/uL — ABNORMAL LOW (ref 4.22–5.81)
RDW: 13.7 % (ref 11.5–15.5)
WBC: 5.4 10*3/uL (ref 4.0–10.5)
nRBC: 0 % (ref 0.0–0.2)

## 2021-02-28 SURGERY — KYPHOPLASTY
Anesthesia: General

## 2021-02-28 MED ORDER — LIDOCAINE HCL (CARDIAC) PF 100 MG/5ML IV SOSY
PREFILLED_SYRINGE | INTRAVENOUS | Status: DC | PRN
  Administered 2021-02-28: 40 mg via INTRAVENOUS

## 2021-02-28 MED ORDER — CEFAZOLIN SODIUM-DEXTROSE 2-4 GM/100ML-% IV SOLN
INTRAVENOUS | Status: AC
  Filled 2021-02-28: qty 100

## 2021-02-28 MED ORDER — PROPOFOL 1000 MG/100ML IV EMUL
INTRAVENOUS | Status: AC
  Filled 2021-02-28: qty 100

## 2021-02-28 MED ORDER — ONDANSETRON HCL 4 MG/2ML IJ SOLN
INTRAMUSCULAR | Status: DC | PRN
  Administered 2021-02-28: 4 mg via INTRAVENOUS

## 2021-02-28 MED ORDER — FENTANYL CITRATE (PF) 100 MCG/2ML IJ SOLN
INTRAMUSCULAR | Status: DC | PRN
  Administered 2021-02-28: 25 ug via INTRAVENOUS

## 2021-02-28 MED ORDER — TRAMADOL HCL 50 MG PO TABS: 50.0000 mg | ORAL_TABLET | Freq: Four times a day (QID) | ORAL | 1 refills | Status: DC | PRN

## 2021-02-28 MED ORDER — CHLORHEXIDINE GLUCONATE 0.12 % MT SOLN
OROMUCOSAL | Status: AC
  Administered 2021-02-28: 15 mL via OROMUCOSAL
  Filled 2021-02-28: qty 15

## 2021-02-28 MED ORDER — PROPOFOL 500 MG/50ML IV EMUL
INTRAVENOUS | Status: DC | PRN
  Administered 2021-02-28: 100 ug/kg/min via INTRAVENOUS

## 2021-02-28 MED ORDER — LIDOCAINE HCL 1 % IJ SOLN
INTRAMUSCULAR | Status: DC | PRN
  Administered 2021-02-28: 30 mL

## 2021-02-28 MED ORDER — BUPIVACAINE-EPINEPHRINE (PF) 0.5% -1:200000 IJ SOLN
INTRAMUSCULAR | Status: DC | PRN
  Administered 2021-02-28: 20 mL via PERINEURAL

## 2021-02-28 MED ORDER — LIDOCAINE HCL (PF) 2 % IJ SOLN
INTRAMUSCULAR | Status: AC
  Filled 2021-02-28: qty 5

## 2021-02-28 MED ORDER — FENTANYL CITRATE (PF) 100 MCG/2ML IJ SOLN
INTRAMUSCULAR | Status: AC
  Filled 2021-02-28: qty 2

## 2021-02-28 MED ORDER — FAMOTIDINE 20 MG PO TABS
ORAL_TABLET | ORAL | Status: AC
  Administered 2021-02-28: 20 mg via ORAL
  Filled 2021-02-28: qty 1

## 2021-02-28 SURGICAL SUPPLY — 23 items
ADH SKN CLS APL DERMABOND .7 (GAUZE/BANDAGES/DRESSINGS) ×1
CEMENT KYPHON CX01A KIT/MIXER (Cement) ×2 IMPLANT
COVER WAND RF STERILE (DRAPES) ×2 IMPLANT
DERMABOND ADVANCED (GAUZE/BANDAGES/DRESSINGS) ×1
DERMABOND ADVANCED .7 DNX12 (GAUZE/BANDAGES/DRESSINGS) ×1 IMPLANT
DEVICE BIOPSY BONE KYPH (INSTRUMENTS) ×1 IMPLANT
DEVICE BIOPSY BONE KYPHX (INSTRUMENTS) ×1 IMPLANT
DRAPE C-ARM XRAY 36X54 (DRAPES) ×2 IMPLANT
DURAPREP 26ML APPLICATOR (WOUND CARE) ×2 IMPLANT
FEE RENTAL RFA GENERATOR (MISCELLANEOUS) IMPLANT
GLOVE SURG SYN 9.0  PF PI (GLOVE) ×1
GLOVE SURG SYN 9.0 PF PI (GLOVE) ×1 IMPLANT
GOWN SRG 2XL LVL 4 RGLN SLV (GOWNS) ×1 IMPLANT
GOWN STRL NON-REIN 2XL LVL4 (GOWNS) ×2
GOWN STRL REUS W/ TWL LRG LVL3 (GOWN DISPOSABLE) ×1 IMPLANT
GOWN STRL REUS W/TWL LRG LVL3 (GOWN DISPOSABLE) ×2
MANIFOLD NEPTUNE II (INSTRUMENTS) ×2 IMPLANT
PACK KYPHOPLASTY (MISCELLANEOUS) ×2 IMPLANT
RENTAL RFA GENERATOR (MISCELLANEOUS) IMPLANT
STRAP SAFETY 5IN WIDE (MISCELLANEOUS) ×2 IMPLANT
SWABSTK COMLB BENZOIN TINCTURE (MISCELLANEOUS) ×2 IMPLANT
TRAY KYPHOPAK 15/3 EXPRESS 1ST (MISCELLANEOUS) ×2 IMPLANT
TRAY KYPHOPAK 20/3 EXPRESS 1ST (MISCELLANEOUS) ×1 IMPLANT

## 2021-02-28 NOTE — H&P (Signed)
Chief Complaint  Patient presents with  . Pre-op Exam  discuss kyphoplasty   Kevin [name] is a 87 y.o. male who presents today to review MRI of the thoracic spine. 4 weeks ago patient had several falls and plain film x-rays by PCP showed T9 compression fracture that appears to be new when compared to previous x-rays. Patient had MRI of the thoracic spine obtained showing acute T9 and T10 compression fractures. Patient's pain is been moderate to severe located along the lower thoracic spine midline with no radicular symptoms. Patient has a history of previous T12 and L1 compression fracture with kyphoplasty procedure. Patient has been taking Tylenol and tramadol with little relief. He is unable to get in and out of his bed due to the pain over the last 4 weeks. He has a hard time sleeping and moving around at night. Patient's pain is mild with sitting but moderate to severe with standing, twisting and getting in and out of the bed.  Past Medical History: Past Medical History:  Diagnosis Date  . Allergic state all my life  . Arthritis some  . Asthma without status asthmaticus, unspecified since birth  . Bifascicular block  . Cancer (CMS-HCC) skin  . Cataract cortical, senile unknown  . Colon polyps  . Congenital foot deformity  . Dermatophytosis of nail  . FH: colon cancer 04/01/2018  . GERD (gastroesophageal reflux disease)  . History of adenomatous polyp of colon 04/01/2018  . History of colonic polyps  . Hyperlipidemia 20 years  . Hypertension 20+years  . Parkinson's disease (CMS-HCC)  . Tremor Parkinson's  . Weight loss, unintentional 04/01/2018   Past Surgical History: Past Surgical History:  Procedure Laterality Date  . BACK SURGERY 2-21  . COLONOSCOPY 02/05/2011  07/27/2005, 09/11/2000; Adenomatous Polyps: CBF 01/2016; No repeat due to age per RTE (dw)  . EGD 09/11/2000  . EXCISION NASAL POLYPS  . FLEXIBLE SIGMOIDOSCOPY 03/02/1997  Adenomatous Polyp  . kidney removal Right  10/2018  . KYPHOPLASTY N/A 12/03/2019  Dr. Rudene Christians  . nose straightened   Past Family History: Family History  Problem Relation Age of Onset  . High blood pressure (Hypertension) Other  . Asthma Father  . Colon polyps Sister  . Breast cancer Sister  . High blood pressure (Hypertension) Mother   Medications: Current Outpatient Medications Ordered in Epic  Medication Sig Dispense Refill  . acetaminophen (TYLENOL) 325 MG tablet Take 650 mg by mouth every 6 (six) hours as needed for Pain  . alendronate (FOSAMAX) 70 MG tablet Take 1 tablet (70 mg total) by mouth every 7 (seven) days WITH A FULL GLASS OF WATER DO NOT LIE DOWN FOR THE NEXT 30 MINUTES 12 tablet 1  . amLODIPine (NORVASC) 10 MG tablet Take 1 tablet (10 mg total) by mouth once daily 90 tablet 3  . atorvastatin (LIPITOR) 20 MG tablet Take 20 mg by mouth once daily  . carbidopa-levodopa (SINEMET) 25-100 mg tablet TAKE ONE TABLET IN THE MORNING, ONE TABLET IN THE AFTERNOON, ONE TABLET AT NIGHT, AND ONE TABLET IN THE MIDDLE OF THE NIGHT WHEN HE WAKES UP. 360 tablet 1  . cyanocobalamin (VITAMIN B12) 1000 MCG tablet Take by mouth  . diclofenac (VOLTAREN) 1 % topical gel Apply 2 g topically 4 (four) times daily  . escitalopram oxalate (LEXAPRO) 10 MG tablet Take 1 tablet (10 mg total) by mouth once daily for 90 days 90 tablet 3  . nystatin (MYCOSTATIN) 100,000 unit/gram cream Apply topically 2 (two) times daily 30  g 2  . ondansetron (ZOFRAN-ODT) 4 MG disintegrating tablet Take by mouth  . psyllium (METAMUCIL) 0.52 gram capsule Take 1 capsule by mouth once daily At noon  . rivastigmine tartrate (EXELON) 3 MG capsule Take 1 capsule (3 mg total) by mouth 2 (two) times daily with meals 180 capsule 3  . sennosides (SENOKOT ORAL) Take by mouth  . traMADoL (ULTRAM) 50 mg tablet Take 1 tablet (50 mg total) by mouth every 6 (six) hours as needed for Pain for up to 5 days 20 tablet 0   No current Epic-ordered facility-administered medications on  file.   Allergies: No Known Allergies   Review of Systems:  A comprehensive 14 point ROS was performed, reviewed by me today, and the pertinent orthopaedic findings are documented in the HPI.  Exam: BP 110/70  Wt 72.6 kg (160 lb)  BMI 24.33 kg/m  General:  Well developed, well nourished, no apparent distress, normal affect, normal gait with no antalgic component.   HEENT: Head normocephalic, atraumatic, PERRL.   Abdomen: Soft, non tender, non distended, Bowel sounds present.  Heart: Examination of the heart reveals regular, rate, and rhythm. There is no murmur noted on ascultation. There is a normal apical pulse.  Lungs: Lungs are clear to auscultation. There is no wheeze, rhonchi, or crackles. There is normal expansion of bilateral chest walls.   Thoracolumbar spine: shows patient has spinous process tenderness with percussion along the thoracolumbar junction midline with no swelling, muscle spasms. Patient ambulates no assistive devices. Neurovascular intact in bilateral lower extremities.  EXAM:  MRI THORACIC SPINE WITHOUT CONTRAST   TECHNIQUE:  Multiplanar, multisequence MR imaging of the thoracic spine was  performed. No intravenous contrast was administered.   COMPARISON: None.   FINDINGS:  Alignment: Exaggerated thoracic kyphosis.   Vertebrae: Compression fracture of the T9 inferior endplate with  prominent marrow edema and approximately 20% loss of vertebral body  height centrally and minimal retropulsion ofthe inferior aspect of  the posterior wall. There is also compression fracture of the  superior endplate of W29 with marrow edema and minimal height loss  without retropulsion.   Chronic compression fractures of T12 and L1 status post vertebral  augmentation mid small retropulsion of the posterior wall into the  spinal canal without high-grade spinal canal stenosis. No evidence  of discitis or aggressive bone lesion.   Cord: Mild flattening of the  anterior contour at the T11-12 level.  No cord signalabnormality.   Paraspinal and other soft tissues: Left renal cyst   Disc levels:   At T9-10, minimal retropulsion of the inferior aspect of the  posterior wall of T9 causes small indentation of the thecal sac  without significant spinal canal or neural foraminal stenosis.   At T11-12-T12-L1, small retropulsion of the superior aspect of the  posterior wall of T12 and L1 causing indentation of the thecal sac  and resulting mild spinal canal stenosis. No significant neural  foraminal narrowing.   No significant spinal canal or neural foraminal stenosis the other  thoracic levels.   IMPRESSION:  1. Acute/subacute compression fractures of T9 and T10 with  associated marrow edema without significant retropulsion.  2. Chronic compression fractures of T12 and L1 status post vertebral  augmentation.    Impression: Closed wedge compression fracture of T9 vertebra, initial encounter (CMS-HCC) [S22.070A] Closed wedge compression fracture of T9 vertebra, initial encounter (CMS-HCC) (primary encounter diagnosis) Closed wedge compression fracture of T10 vertebra, initial encounter (CMS-HCC)  Plan:  23. 87 year old male with  T9 and T10 compression fracture, MRI confirms acute compression deformity. Patient's pain is moderate to severe and interfering with activities of daily living. Risks, benefits, complications of T9 and S34 kyphoplasty have been discussed with the patient. Patient has agreed and consented procedure with tomorrow with Dr. Hessie Knows on 02/28/2021. He will continue with tramadol and Tylenol for pain.  This note was generated in part with voice recognition software and I apologize for any typographical errors that were not detected and corrected.  Feliberto Gottron MPA-C   Electronically signed by Feliberto Gottron, PA at 02/27/2021 2:18 PM EDT    Reviewed  H+P. No changes noted.

## 2021-02-28 NOTE — Anesthesia Preprocedure Evaluation (Signed)
Anesthesia Evaluation  Patient identified by MRN, date of birth, ID band Patient awake    Reviewed: Allergy & Precautions, H&P , NPO status , Patient's Chart, lab work & pertinent test results  History of Anesthesia Complications Negative for: history of anesthetic complications  Airway Mallampati: III  TM Distance: >3 FB Neck ROM: limited    Dental  (+) Chipped   Pulmonary neg shortness of breath, asthma , neg pneumonia , neg recent URI,    breath sounds clear to auscultation       Cardiovascular Exercise Tolerance: Good hypertension, (-) angina(-) Past MI and (-) DOE  Rhythm:Regular Rate:Normal     Neuro/Psych PSYCHIATRIC DISORDERS Dementia negative neurological ROS     GI/Hepatic Neg liver ROS, GERD  Medicated and Controlled,  Endo/Other  negative endocrine ROS  Renal/GU negative Renal ROS  negative genitourinary   Musculoskeletal   Abdominal   Peds  Hematology negative hematology ROS (+)   Anesthesia Other Findings Past Medical History: No date: Asthma No date: Broken arm     Comment:  Pt was 19 07/2018: Cancer ([place])     Comment:  RENAL, 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 No date: Cataract cortical, senile No date: Colon polyps No date: GERD (gastroesophageal reflux disease) No date: Hyperlipidemia No date: Hypertension No date: Pneumonia     Comment:  AS A CHILD  Past Surgical History: 2001, 2006, 2012: COLONOSCOPY 09/11/2000: ESOPHAGOGASTRODUODENOSCOPY 03/02/1997: FLEXIBLE SIGMOIDOSCOPY No date: NASAL POLYP EXCISION 11/17/2018: ROBOT ASSISTED LAPAROSCOPIC NEPHRECTOMY; Right     Comment:  Procedure: ROBOTIC ASSISTED LAPAROSCOPIC NEPHRECTOMY;                Surgeon: Hollice Espy, MD;  Location: [facility] ORS;                Service: Urology;  Laterality: Right;  BMI    Body Mass Index: 23.09 kg/m      Reproductive/Obstetrics negative OB ROS                             Anesthesia  Physical  Anesthesia Plan  ASA: III  Anesthesia Plan: General   Post-op Pain Management:    Induction: Intravenous  PONV Risk Score and Plan: 2 and Propofol infusion, TIVA, Ondansetron and Treatment may vary due to age or medical condition  Airway Management Planned: Natural Airway and Nasal Cannula  Additional Equipment: None  Intra-op Plan:   Post-operative Plan:   Informed Consent: I have reviewed the patients History and Physical, chart, labs and discussed the procedure including the risks, benefits and alternatives for the proposed anesthesia with the patient or authorized representative who has indicated his/her understanding and acceptance.     Dental Advisory Given  Plan Discussed with: Anesthesiologist, CRNA and Surgeon  Anesthesia Plan Comments: (Discussed risks of anesthesia with patient, including possibility of difficulty with spontaneous ventilation under anesthesia necessitating airway intervention, PONV, and rare risks such as cardiac or respiratory or neurological events. Patient understands.)        Anesthesia Quick Evaluation

## 2021-02-28 NOTE — Transfer of Care (Signed)
Immediate Anesthesia Transfer of Care Note  Patient: Kevin [name]  Procedure(s) Performed: T9 & T10 KYPHOPLASTY (N/A )  Patient Location: PACU  Anesthesia Type:MAC  Level of Consciousness: drowsy  Airway & Oxygen Therapy: Patient Spontanous Breathing and Patient connected to nasal cannula oxygen  Post-op Assessment: Report given to RN and Post -op Vital signs reviewed and stable  Post vital signs: Reviewed stable  Last Vitals:  Vitals Value Taken Time  BP 122/60 02/28/21 1456  Temp    Pulse 94 02/28/21 1459  Resp 20 02/28/21 1459  SpO2 100 % 02/28/21 1459  Vitals shown include unvalidated device data.  Last Pain:  Vitals:   02/28/21 1346  TempSrc: Tympanic  PainSc: 0-No pain         Complications: No complications documented.

## 2021-02-28 NOTE — Op Note (Signed)
02/28/2021  2:53 PM  PATIENT:  Kevin [name]   MRN: [id number]   PRE-OPERATIVE DIAGNOSIS:  closed wedge compression fracture of T9 and T10   POST-OPERATIVE DIAGNOSIS:  closed wedge compression fracture of T9 and T10   PROCEDURE:  Procedure(s): KYPHOPLASTY T9 and T10  SURGEON: Laurene Footman, MD   ASSISTANTS: None   ANESTHESIA:   local and MAC   EBL:  No intake/output data recorded.   BLOOD ADMINISTERED:none   DRAINS: none    LOCAL MEDICATIONS USED:  MARCAINE    and XYLOCAINE    SPECIMEN:   None   DISPOSITION OF SPECIMEN:  Not applicable   COUNTS:  YES   TOURNIQUET:  * No tourniquets in log *   IMPLANTS: Bone cement   DICTATION: .Dragon Dictation  patient was brought to the operating room and after adequate anesthesia was obtained the patient was placed prone.  C arm was brought in in good visualization of the affected level obtained on both AP and lateral projections.  After patient identification and timeout procedures were completed, local anesthetic was infiltrated with 10 cc 1% Xylocaine infiltrated subcutaneously.  This is done the area on the each side of the planned approach.  The back was then prepped and draped in the usual sterile manner and repeat timeout procedure carried out.  A spinal needle was brought down to the pedicle on the right side of  T9 and the left side of T10 and a 50-50 mix of 1% Xylocaine half percent Sensorcaine with epinephrine total of 20 cc injected on each side.  After allowing this to set a small incision was made and the trocar was advanced into the vertebral body in an extrapedicular fashion.  Biopsy was not obtained at either level Drilling was carried out balloon inserted with inflation to  2-1/2 cc on the right at T9 and 2 cc on the left at T10.  When the cement was appropriate consistency 5 cc were injected on the right at T9t and 3 cc on the left into the vertebral body at T10 without extravasation, good fill superior to inferior endplates  and from right to left sides along the inferior endplate.  After the cement had set the trochar was removed and permanent C-arm views obtained.  The wound was closed with Dermabond followed by Band-Aid   PLAN OF CARE:  Discharge home after recovery room   PATIENT DISPOSITION:  PACU - hemodynamically stable.

## 2021-02-28 NOTE — Discharge Instructions (Addendum)
Take it easy today and try to start walking is much as you can tomorrow. Remove Band-Aids on Thursday then okay to shower Pain medicine as directed Call office if you are having problems.  AMBULATORY SURGERY  DISCHARGE INSTRUCTIONS   1) The drugs that you were given will stay in your system until tomorrow so for the next 24 hours you should not:  A) Drive an automobile B) Make any legal decisions C) Drink any alcoholic beverage   2) You may resume regular meals tomorrow.  Today it is better to start with liquids and gradually work up to solid foods.  You may eat anything you prefer, but it is better to start with liquids, then soup and crackers, and gradually work up to solid foods.   3) Please notify your doctor immediately if you have any unusual bleeding, trouble breathing, redness and pain at the surgery site, drainage, fever, or pain not relieved by medication.    4) Additional Instructions:        Please contact your physician with any problems or Same Day Surgery at [phone], Monday through Friday 6 am to 4 pm, or [place] at [facility] number at [phone].

## 2021-03-01 ENCOUNTER — Encounter: Payer: Self-pay | Admitting: Orthopedic Surgery

## 2021-03-01 NOTE — Anesthesia Postprocedure Evaluation (Signed)
Anesthesia Post Note  Patient: Kevin [name]  Procedure(s) Performed: T9 & T10 KYPHOPLASTY (N/A )  Patient location during evaluation: PACU Anesthesia Type: General Level of consciousness: awake and alert Pain management: pain level controlled Vital Signs Assessment: post-procedure vital signs reviewed and stable Respiratory status: spontaneous breathing, nonlabored ventilation, respiratory function stable and patient connected to nasal cannula oxygen Cardiovascular status: blood pressure returned to baseline and stable Postop Assessment: no apparent nausea or vomiting Anesthetic complications: no   No complications documented.   Last Vitals:  Vitals:   02/28/21 1530 02/28/21 1557  BP: 138/84 (!) 145/73  Pulse: 93 94  Resp: 13 16  Temp: 36.6 C (!) 36.1 C  SpO2: 100% 100%    Last Pain:  Vitals:   02/28/21 1557  TempSrc: Axillary  PainSc: 0-No pain                 Arita Miss

## 2021-03-13 ENCOUNTER — Ambulatory Visit: Payer: Medicare HMO | Admitting: Gastroenterology

## 2021-06-02 ENCOUNTER — Inpatient Hospital Stay: Payer: Medicare HMO | Attending: Oncology

## 2021-06-02 ENCOUNTER — Other Ambulatory Visit: Payer: Self-pay

## 2021-06-02 DIAGNOSIS — Z905 Acquired absence of kidney: Secondary | ICD-10-CM | POA: Diagnosis not present

## 2021-06-02 DIAGNOSIS — I700 Atherosclerosis of aorta: Secondary | ICD-10-CM | POA: Insufficient documentation

## 2021-06-02 DIAGNOSIS — Z86010 Personal history of colonic polyps: Secondary | ICD-10-CM | POA: Insufficient documentation

## 2021-06-02 DIAGNOSIS — Z803 Family history of malignant neoplasm of breast: Secondary | ICD-10-CM | POA: Diagnosis not present

## 2021-06-02 DIAGNOSIS — Z800 Family history of malignant neoplasm of digestive organs: Secondary | ICD-10-CM | POA: Insufficient documentation

## 2021-06-02 DIAGNOSIS — K219 Gastro-esophageal reflux disease without esophagitis: Secondary | ICD-10-CM | POA: Insufficient documentation

## 2021-06-02 DIAGNOSIS — D696 Thrombocytopenia, unspecified: Secondary | ICD-10-CM

## 2021-06-02 DIAGNOSIS — D61818 Other pancytopenia: Secondary | ICD-10-CM | POA: Diagnosis present

## 2021-06-02 DIAGNOSIS — M810 Age-related osteoporosis without current pathological fracture: Secondary | ICD-10-CM | POA: Diagnosis not present

## 2021-06-02 DIAGNOSIS — J45909 Unspecified asthma, uncomplicated: Secondary | ICD-10-CM | POA: Diagnosis not present

## 2021-06-02 DIAGNOSIS — Z79899 Other long term (current) drug therapy: Secondary | ICD-10-CM | POA: Insufficient documentation

## 2021-06-02 DIAGNOSIS — E538 Deficiency of other specified B group vitamins: Secondary | ICD-10-CM | POA: Diagnosis not present

## 2021-06-02 DIAGNOSIS — G20 Parkinson's disease: Secondary | ICD-10-CM | POA: Diagnosis not present

## 2021-06-02 DIAGNOSIS — I10 Essential (primary) hypertension: Secondary | ICD-10-CM | POA: Diagnosis not present

## 2021-06-02 DIAGNOSIS — R634 Abnormal weight loss: Secondary | ICD-10-CM | POA: Insufficient documentation

## 2021-06-02 DIAGNOSIS — E785 Hyperlipidemia, unspecified: Secondary | ICD-10-CM | POA: Diagnosis not present

## 2021-06-02 DIAGNOSIS — F0280 Dementia in other diseases classified elsewhere without behavioral disturbance: Secondary | ICD-10-CM | POA: Diagnosis not present

## 2021-06-02 LAB — CBC WITH DIFFERENTIAL/PLATELET
Abs Immature Granulocytes: 0.03 10*3/uL (ref 0.00–0.07)
Basophils Absolute: 0 10*3/uL (ref 0.0–0.1)
Basophils Relative: 1 %
Eosinophils Absolute: 0.1 10*3/uL (ref 0.0–0.5)
Eosinophils Relative: 2 %
HCT: 35.6 % — ABNORMAL LOW (ref 39.0–52.0)
Hemoglobin: 11.8 g/dL — ABNORMAL LOW (ref 13.0–17.0)
Immature Granulocytes: 1 %
Lymphocytes Relative: 23 %
Lymphs Abs: 0.9 10*3/uL (ref 0.7–4.0)
MCH: 30.3 pg (ref 26.0–34.0)
MCHC: 33.1 g/dL (ref 30.0–36.0)
MCV: 91.3 fL (ref 80.0–100.0)
Monocytes Absolute: 0.6 10*3/uL (ref 0.1–1.0)
Monocytes Relative: 14 %
Neutro Abs: 2.3 10*3/uL (ref 1.7–7.7)
Neutrophils Relative %: 59 %
Platelets: 140 10*3/uL — ABNORMAL LOW (ref 150–400)
RBC: 3.9 MIL/uL — ABNORMAL LOW (ref 4.22–5.81)
RDW: 14.3 % (ref 11.5–15.5)
WBC: 3.9 10*3/uL — ABNORMAL LOW (ref 4.0–10.5)
nRBC: 0 % (ref 0.0–0.2)

## 2021-06-02 LAB — COMPREHENSIVE METABOLIC PANEL
ALT: <5 U/L (ref 0–44)
AST: 12 U/L — ABNORMAL LOW (ref 15–41)
Albumin: 4.1 g/dL (ref 3.5–5.0)
Alkaline Phosphatase: 61 U/L (ref 38–126)
Anion gap: 9 (ref 5–15)
BUN: 29 mg/dL — ABNORMAL HIGH (ref 8–23)
CO2: 27 mmol/L (ref 22–32)
Calcium: 9 mg/dL (ref 8.9–10.3)
Chloride: 100 mmol/L (ref 98–111)
Creatinine, Ser: 0.99 mg/dL (ref 0.61–1.24)
GFR, Estimated: >60 mL/min (ref >60)
Glucose, Bld: 76 mg/dL (ref 70–99)
Potassium: 4 mmol/L (ref 3.5–5.1)
Sodium: 136 mmol/L (ref 135–145)
Total Bilirubin: 0.7 mg/dL (ref 0.3–1.2)
Total Protein: 6.6 g/dL (ref 6.5–8.1)

## 2021-06-02 LAB — VITAMIN B12: Vitamin B-12: 667 pg/mL (ref 180–914)

## 2021-06-05 ENCOUNTER — Inpatient Hospital Stay: Payer: Medicare HMO

## 2021-06-05 ENCOUNTER — Encounter: Payer: Self-pay | Admitting: Oncology

## 2021-06-05 ENCOUNTER — Other Ambulatory Visit: Payer: Self-pay

## 2021-06-05 ENCOUNTER — Inpatient Hospital Stay: Payer: Medicare HMO | Admitting: Oncology

## 2021-06-05 VITALS — BP 118/70 | HR 77 | Temp 98.2°F | Resp 18 | Wt 149.3 lb

## 2021-06-05 DIAGNOSIS — D649 Anemia, unspecified: Secondary | ICD-10-CM

## 2021-06-05 DIAGNOSIS — E538 Deficiency of other specified B group vitamins: Secondary | ICD-10-CM | POA: Diagnosis not present

## 2021-06-05 DIAGNOSIS — D61818 Other pancytopenia: Secondary | ICD-10-CM | POA: Diagnosis not present

## 2021-06-05 DIAGNOSIS — D696 Thrombocytopenia, unspecified: Secondary | ICD-10-CM

## 2021-06-05 MED ORDER — CYANOCOBALAMIN 1000 MCG/ML IJ SOLN
1000.0000 ug | Freq: Once | INTRAMUSCULAR | Status: AC
  Administered 2021-06-05: 1000 ug via INTRAMUSCULAR
  Filled 2021-06-05: qty 1

## 2021-06-05 NOTE — Progress Notes (Signed)
Hematology/Oncology follow up note [facility] [phone] [phone]   Patient Care Team: Baxter Hire, MD as PCP - General (Internal Medicine) Earlie Server, MD as Consulting Physician (Hematology and Oncology)  REFERRING PROVIDER: Denice Paradise CHIEF COMPLAINTS/REASON FOR VISIT:  Follow up for pancytopenia  HISTORY OF PRESENTING ILLNESS:  Kevin [name] is a  86 y.o.  male with PMH listed below who was referred to me for evaluation of kidney mass.  Patient was recently seen by gastroenterology Dr. Vira Agar at the request of patient's primary care physician for consultation for evaluation and management of weight loss, history of colon polyp and a family history of colon cancer. Patient reports that he has lost 20 pounds for the past several years.  May be 4 pounds over the past 6 months. Reports that patient does not eat much and try to eat healthy food. There appears to be some intentional cutting back on calories.  Patient reports he is not as hungry as he used to be. Patient remains very active for his age.  He goes to gym 3 times a week.  Denies any abdominal pain, diarrhea, constipation change of bowel habits.  Denies seeing any blood in the stool. Denies drenching night sweats, fever or chills. Per GI note, last colonoscopy was in 2012 which reviewed benign polyps and internal hemorrhoids, small area of erosion in the cecum biopsy showed chronic active colitis.  Due to patient's age, he is not quite interested to have colonoscopy done and prefers to do noninvasive test first.  So a CT abdomen Pelvis with contrast was obtained.  CT image showed a 6.7 cm mass in the upper pole of the right kidney and a 1.5 cm mass in the lower pole of the left kidney.  Both suspicious for renal cell carcinoma. There is no evidence of metastatic disease within the abdomen or pelvis. Also noted large duodenal diverticulum, mildly enlarged prostate and aortic  athero-sclerosis. Patient denies any flank pain, or hematuria. Family history reviewed. Two sisters diagnosed with breast cancer.   Reviewed patient's previous lab work in [facility].  TSH was checked on 01/31/2017, normal.  PSA 2.05,   # 11/17/2018 Patient underwent right nephrectomy which showed RCC,  clear cell type.  Patient did not follow-up since his surgery.He follows up with Dr. Erlene Quan and has had surveillance images done via urology office. 06/02/2020, CT abdomen pelvis showed no evidence of recurrence or metastatic disease.  Colonic diverticulosis.  Stable liver lesions, better characterize as benign hemangioma on previous MRI liver.  Moderately enlarged prostate.  Aortic atherosclerosis.  # blood work done on 09/19/2020 which showed leukopenia with WBC count of 3.2, anemia with hemoglobin 12.5, Platelet count 1 26,000.  Patient was referred back to me for evaluation of pancytopenia  # 01/23/2021- 01/26/2021 -pancreatitis.  He does not drink alcohol, no gallstone.  Etiology unclear. INTERVAL HISTORY Kevin [name] is a 87 y.o. male presents to reestablish care for evaluation of pancytopenia. Problems and complaints are listed below: Patient was accompanied by wife.  He has Parkinson's disease. He has lost 5 pounds since last visit in April 2022. Denies any abdominal pain, bloating, malabsorption symptoms He takes oral vitamin B12 supplements.   . Review of Systems  Constitutional:  Positive for malaise/fatigue and weight loss. Negative for chills and fever.  HENT:  Negative for nosebleeds and sore throat.   Eyes:  Negative for double vision, photophobia and redness.  Respiratory:  Negative for cough, shortness of breath  and wheezing.   Cardiovascular:  Negative for chest pain, palpitations and orthopnea.  Gastrointestinal:  Negative for abdominal pain, blood in stool, nausea and vomiting.  Genitourinary:  Negative for dysuria.  Musculoskeletal:  Negative for back pain, myalgias and neck  pain.  Skin:  Negative for itching and rash.  Neurological:  Negative for dizziness, tingling and tremors.  Endo/Heme/Allergies:  Negative for environmental allergies. Does not bruise/bleed easily.  Psychiatric/Behavioral:  Negative for depression.    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   Cataract cortical, senile    Colon polyps    Dementia ([place])    Parkinson's, loss of short term memory   E coli infection 2020   blood stream   GERD (gastroesophageal reflux disease)    HOH (hard of hearing)    extremely HOH, wears aids   Hyperlipidemia    Hypertension    Osteoporosis    Pneumonia    AS A CHILD    SURGICAL HISTORY: Past Surgical History:  Procedure Laterality Date   BACK SURGERY     CATARACT EXTRACTION W/PHACO Left 05/16/2020   Procedure: CATARACT EXTRACTION PHACO AND INTRAOCULAR LENS PLACEMENT (IOC) LEFT 7.27 00:39.0 ;  Surgeon: Birder Robson, MD;  Location: [place];  Service: Ophthalmology;  Laterality: Left;   CATARACT EXTRACTION W/PHACO Right 06/07/2020   Procedure: CATARACT EXTRACTION PHACO AND INTRAOCULAR LENS PLACEMENT (IOC) RIGHT 11.56 00:54.9;  Surgeon: Birder Robson, MD;  Location: [place];  Service: Ophthalmology;  Laterality: Right;   COLONOSCOPY  2001, 2006, 2012   ESOPHAGOGASTRODUODENOSCOPY  09/11/2000   FLEXIBLE SIGMOIDOSCOPY  03/02/1997   KYPHOPLASTY N/A 11/13/2019   Procedure: T12 KYPHOPLASTY;  Surgeon: Hessie Knows, MD;  Location: [facility];  Service: Orthopedics;  Laterality: N/A;   KYPHOPLASTY N/A 12/03/2019   Procedure: L1 KYPHOPLASTY;  Surgeon: Hessie Knows, MD;  Location: [facility];  Service: Orthopedics;  Laterality: N/A;   KYPHOPLASTY N/A 02/28/2021   Procedure: T9 & T10 KYPHOPLASTY;  Surgeon: Hessie Knows, MD;  Location: [facility];  Service: Orthopedics;   Laterality: N/A;   NASAL POLYP EXCISION     NEPHRECTOMY     ROBOT ASSISTED LAPAROSCOPIC NEPHRECTOMY Right 11/17/2018   Procedure: ROBOTIC ASSISTED LAPAROSCOPIC NEPHRECTOMY;  Surgeon: Hollice Espy, MD;  Location: [facility];  Service: Urology;  Laterality: Right;    SOCIAL HISTORY: Social History   Socioeconomic History   Marital status: Married    Spouse name: [name]    Number of children: 1   Years of education: Not on file   Highest education level: Not on file  Occupational History   Occupation: retired    Comment: Network engineer.  Tobacco Use   Smoking status: Never   Smokeless tobacco: Never  Vaping Use   Vaping Use: Never used  Substance and Sexual Activity   Alcohol use: Never   Drug use: Never   Sexual activity: Not on file  Other Topics Concern   Not on file  Social History Narrative   Not on file   Social Determinants of Health   Financial Resource Strain: Not on file  Food Insecurity: Not on file  Transportation Needs: Not on file  Physical Activity: Not on file  Stress: Not on file  Social Connections: Not on file  Intimate Partner Violence: Not on file    FAMILY HISTORY: Family History  Problem Relation Age of Onset   Heart disease Mother    Pancreatitis Mother    Emphysema Father    Breast cancer Sister    Breast cancer Sister     ALLERGIES:  has No Known Allergies.  MEDICATIONS:  Current Outpatient Medications  Medication Sig Dispense Refill   alendronate (FOSAMAX) 70 MG tablet Take 70 mg by mouth once a week. Saturday     amLODipine (NORVASC) 5 MG tablet Take 2 tablets (10 mg total) by mouth daily. 60 tablet 1   atorvastatin (LIPITOR) 20 MG tablet Take 1 tablet (20 mg total) by mouth daily. 30 tablet 2   carbidopa-levodopa (SINEMET IR) 25-100 MG tablet Take 1-1.5 tablets by mouth See admin instructions. Take 1.5 tablet (0800) by mouth in the morning, then 1.5 tablet (1200) by mouth at noon, then 1.5 tablet by mouth at 5 pm (1700), then 1  tablet by mouth at bedtime (2200), then 1 tablet at 0300.     escitalopram (LEXAPRO) 10 MG tablet Take 10 mg by mouth daily.     Multiple Vitamins-Minerals (PRESERVISION AREDS 2 PO) Take 1 tablet by mouth daily.     nystatin cream (MYCOSTATIN) Apply 1 application topically daily as needed for dry skin (on bottom).     psyllium (METAMUCIL) 0.52 g capsule Take 0.52 g by mouth daily as needed (Constipation).     rivastigmine (EXELON) 3 MG capsule Take 3 mg by mouth 2 (two) times daily with a meal.     sennosides-docusate sodium (SENOKOT-S) 8.6-50 MG tablet Take 1 tablet by mouth daily as needed for constipation.     traMADol (ULTRAM) 50 MG tablet Take 1 tablet (50 mg total) by mouth every 6 (six) hours as needed. 30 tablet 1   vitamin B-12 (CYANOCOBALAMIN) 1000 MCG tablet Take 1 tablet (1,000 mcg total) by mouth daily. 90 tablet 1   No current facility-administered medications for this visit.     PHYSICAL EXAMINATION: ECOG PERFORMANCE STATUS: 1 - Symptomatic but completely ambulatory Vitals:   06/05/21 1319  BP: 118/70  Pulse: 77  Resp: 18  Temp: 98.2 F (36.8 C)   Filed Weights   06/05/21 1319  Weight: 149 lb 4.8 oz (67.7 kg)    Physical Exam Constitutional:      General: He is not in acute distress.    Appearance: He is well-developed.     Comments: Frail, walks independently  HENT:     Head: Normocephalic and atraumatic.     Right Ear: External ear normal.     Left Ear: External ear normal.  Eyes:     General: No scleral icterus.    Conjunctiva/sclera: Conjunctivae normal.     Pupils: Pupils are equal, round, and reactive to light.  Cardiovascular:     Rate and Rhythm: Normal rate and regular rhythm.     Heart sounds: Normal heart sounds.  Pulmonary:     Effort: Pulmonary effort is normal. No respiratory distress.     Breath sounds: Normal breath sounds. No wheezing or rales.  Chest:     Chest wall: No tenderness.  Abdominal:     General: Bowel sounds are normal.  There is no distension.     Palpations: Abdomen is soft. There is no mass.     Tenderness: There is no abdominal tenderness.  Musculoskeletal:        General: No deformity. Normal range of motion.  Cervical back: Normal range of motion and neck supple.  Lymphadenopathy:     Cervical: No cervical adenopathy.  Skin:    General: Skin is warm and dry.     Findings: No rash.  Neurological:     Mental Status: He is alert. Mental status is at baseline.     Cranial Nerves: No cranial nerve deficit.     Coordination: Coordination normal.     LABORATORY DATA:  I have reviewed the data as listed Lab Results  Component Value Date   WBC 3.9 (L) 06/02/2021   HGB 11.8 (L) 06/02/2021   HCT 35.6 (L) 06/02/2021   MCV 91.3 06/02/2021   PLT 140 (L) 06/02/2021   Recent Labs    10/17/20 1517 11/09/20 1504 01/24/21 0501 01/26/21 0604 02/28/21 1343 06/02/21 1124  NA  --    < > 136 138 133* 136  K  --    < > 4.0 3.2* 4.2 4.0  CL  --    < > 107 106 100 100  CO2  --    < > '23 23 24 27  '$ GLUCOSE  --    < > 77 86 96 76  BUN  --    < > 29* 12 20 29*  CREATININE  --    < > 0.93 0.73 0.91 0.99  CALCIUM  --    < > 7.8* 8.2* 9.0 9.0  GFRNONAA  --    < > >60 >60 >60 >60  PROT 6.7   < > 5.4* 5.8*  --  6.6  ALBUMIN 4.1   < > 3.0* 3.1*  --  4.1  AST 13*   < > 133* 27  --  12*  ALT 5   < > 55* 11  --  <5  ALKPHOS 51   < > 98 85  --  61  BILITOT 0.6   < > 1.0 1.0  --  0.7  BILIDIR <0.1  --  0.3*  --   --   --   IBILI NOT CALCULATED  --  0.7  --   --   --    < > = values in this interval not displayed.    Iron/TIBC/Ferritin/ %Sat    Component Value Date/Time   FERRITIN 282 03/04/2019 0413        ASSESSMENT & PLAN:  1. Vitamin B12 deficiency   2. Anemia, unspecified type   3. Thrombocytopenia ([place])    # Normocytic anemia, hemoglobin 11.8, close to his baseline.  #Thrombocytopenia, 140,000, stable. Continue to monitor.  #Vitamin B12 deficiency,  B12 level is 667, trending down despite  oral B12 supplementation, still at goal. Proceed with B12 injection today, repeat in 3 months. Continue oral B12 supplementation  History of RCC, continue follow-up with urology-   Orders Placed This Encounter  Procedures   CBC with Differential/Platelet    Standing Status:   Future    Standing Expiration Date:   06/05/2022   Comprehensive metabolic panel    Standing Status:   Future    Standing Expiration Date:   06/05/2022   Vitamin B12    Standing Status:   Future    Standing Expiration Date:   06/05/2022    All questions were answered. The patient knows to call the clinic with any problems questions or concerns.  Return of visit: 6 months.  Earlie Server, MD, PhD Hematology Oncology [facility] Pager- [phone] 06/05/2021

## 2021-06-05 NOTE — Progress Notes (Signed)
Pt here for follow up. No new concerns voiced.   

## 2021-06-09 ENCOUNTER — Ambulatory Visit
Admission: RE | Admit: 2021-06-09 | Discharge: 2021-06-09 | Disposition: A | Discharge status: Home / Self Care | Payer: Medicare HMO | Source: Ambulatory Visit | Attending: Urology | Admitting: Urology

## 2021-06-09 ENCOUNTER — Other Ambulatory Visit: Payer: Self-pay

## 2021-06-09 DIAGNOSIS — C641 Malignant neoplasm of right kidney, except renal pelvis: Secondary | ICD-10-CM | POA: Insufficient documentation

## 2021-06-09 MED ORDER — IOHEXOL 350 MG/ML SOLN
75.0000 mL | Freq: Once | INTRAVENOUS | Status: AC | PRN
  Administered 2021-06-09: 75 mL via INTRAVENOUS

## 2021-06-13 NOTE — Progress Notes (Signed)
06/19/21 8:15 AM   Kevin [name] Kevin [name] 07/04/1933 [id number]  Referring provider:  Baxter Hire, MD [place],  [place] [address] Chief Complaint  Patient presents with   Follow-up     HPI: Kevin [name] is a 87 y.o.male who returns today with CT/CXR.   On 11/17/2018 he underwent robotic assisted laparoscopic nephrectomy.   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 Pt1b, Nx.   CT of the chest in the form of angio on 01/23/2021 ruled out PE. This showed no suspicious lung lesions. Ultrasound abdomen limited RUQ showed no visualized cholelithiasis or choledocholithiasis, and trace perihepatic free fluid, likely related to patient's known pancreatitis.   Admitted  01/25/2021  for pancreatitis of unknown origin.   06/09/2021 CT abdomen pelvis with contrast showed previous right nephrectomy. No evidence of recurrent or metastatic carcinoma within the abdomen or pelvis. Stable moderately enlarged prostate, and findings of chronic bladder outlet obstruction Colonic diverticulosis, without radiographic evidence of diverticulitis. Stable small left inguinal hernia containing only fat.   He is doing well today and is accompanied by his wife. Wife states he has had increased development of his Parkinson's disease.   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   Cataract cortical, senile    Colon polyps    Dementia ([place])    Parkinson's, loss of short term memory   E coli infection 2020   blood stream   GERD (gastroesophageal reflux disease)    HOH (hard of hearing)    extremely HOH, wears aids   Hyperlipidemia    Hypertension    Osteoporosis    Pneumonia    AS A CHILD    Surgical History: Past Surgical History:   Procedure Laterality Date   BACK SURGERY     CATARACT EXTRACTION W/PHACO Left 05/16/2020   Procedure: CATARACT EXTRACTION PHACO AND INTRAOCULAR LENS PLACEMENT (IOC) LEFT 7.27 00:39.0 ;  Surgeon: Birder Robson, MD;  Location: [facility];  Service: Ophthalmology;  Laterality: Left;   CATARACT EXTRACTION W/PHACO Right 06/07/2020   Procedure: CATARACT EXTRACTION PHACO AND INTRAOCULAR LENS PLACEMENT (IOC) RIGHT 11.56 00:54.9;  Surgeon: Birder Robson, MD;  Location: [facility];  Service: Ophthalmology;  Laterality: Right;   COLONOSCOPY  2001, 2006, 2012   ESOPHAGOGASTRODUODENOSCOPY  09/11/2000   FLEXIBLE SIGMOIDOSCOPY  03/02/1997   KYPHOPLASTY N/A 11/13/2019   Procedure: T12 KYPHOPLASTY;  Surgeon: Hessie Knows, MD;  Location: [facility];  Service: Orthopedics;  Laterality: N/A;   KYPHOPLASTY N/A 12/03/2019   Procedure: L1 KYPHOPLASTY;  Surgeon: Hessie Knows, MD;  Location: [facility];  Service: Orthopedics;  Laterality: N/A;   KYPHOPLASTY N/A 02/28/2021   Procedure: T9 & T10 KYPHOPLASTY;  Surgeon: Hessie Knows, MD;  Location: [facility];  Service: Orthopedics;  Laterality: N/A;   NASAL POLYP EXCISION     NEPHRECTOMY     ROBOT ASSISTED LAPAROSCOPIC NEPHRECTOMY Right 11/17/2018   Procedure: ROBOTIC ASSISTED LAPAROSCOPIC NEPHRECTOMY;  Surgeon: Hollice Espy, MD;  Location: [facility];  Service: Urology;  Laterality: Right;    Home Medications:  Allergies as of 06/14/2021   No Known Allergies      Medication List  Accurate as of June 14, 2021 11:59 PM. If you have any questions, ask your nurse or doctor.          alendronate 70 MG tablet Commonly known as: FOSAMAX Take 70 mg by mouth once a week. Saturday   amLODipine 5 MG tablet Commonly known as: NORVASC Take 2 tablets (10 mg total) by mouth daily.   atorvastatin 20 MG tablet Commonly known as: Lipitor Take 1 tablet (20 mg total) by mouth daily.   carbidopa-levodopa 25-100 MG tablet Commonly known  as: SINEMET IR Take 1-1.5 tablets by mouth See admin instructions. Take 1.5 tablet (0800) by mouth in the morning, then 1.5 tablet (1200) by mouth at noon, then 1.5 tablet by mouth at 5 pm (1700), then 1 tablet by mouth at bedtime (2200), then 1 tablet at 0300.   escitalopram 10 MG tablet Commonly known as: LEXAPRO Take 10 mg by mouth daily.   Metamucil 0.52 g capsule Generic drug: psyllium Take 0.52 g by mouth daily as needed (Constipation).   nystatin cream Commonly known as: MYCOSTATIN Apply 1 application topically daily as needed for dry skin (on bottom).   PRESERVISION AREDS 2 PO Take 1 tablet by mouth daily.   rivastigmine 3 MG capsule Commonly known as: EXELON Take 3 mg by mouth 2 (two) times daily with a meal.   sennosides-docusate sodium 8.6-50 MG tablet Commonly known as: SENOKOT-S Take 1 tablet by mouth daily as needed for constipation.   traMADol 50 MG tablet Commonly known as: ULTRAM Take 1 tablet (50 mg total) by mouth every 6 (six) hours as needed.   vitamin B-12 1000 MCG tablet Commonly known as: CYANOCOBALAMIN Take 1 tablet (1,000 mcg total) by mouth daily.        Allergies: No Known Allergies  Family History: Family History  Problem Relation Age of Onset   Heart disease Mother    Pancreatitis Mother    Emphysema Father    Breast cancer Sister    Breast cancer Sister     Social History:  reports that he has never smoked. He has never used smokeless tobacco. He reports that he does not drink alcohol and does not use drugs.   Physical Exam: BP (!) 150/83   Pulse 77   Ht '5\' 8"'$  (1.727 m)   Wt 152 lb (68.9 kg)   BMI 23.11 kg/m   Constitutional:  Alert and oriented, No acute distress. HEENT:  AT, moist mucus membranes.  Trachea midline, no masses. Cardiovascular: No clubbing, cyanosis, or edema. Respiratory: Normal respiratory effort, no increased work of breathing. Skin: No rashes, bruises or suspicious lesions. Neurologic: Grossly  intact, no focal deficits, moving all 4 extremities. Psychiatric: Normal mood and affect.  Laboratory Data:  Lab Results  Component Value Date   CREATININE 0.99 06/02/2021     Pertinent Imaging: CLINICAL DATA:  Chest and abdominal pain. Evaluate for aortic dissection.   EXAM: CT ANGIOGRAPHY CHEST, ABDOMEN AND PELVIS   TECHNIQUE: Non-contrast CT of the chest was initially obtained.   Multidetector CT imaging through the chest, abdomen and pelvis was performed using the standard protocol during bolus administration of intravenous contrast. Multiplanar reconstructed images and MIPs were obtained and reviewed to evaluate the vascular anatomy.   CONTRAST:  174m OMNIPAQUE IOHEXOL 350 MG/ML SOLN   COMPARISON:  CT scan of the abdomen and pelvis 11/09/2020; prior CTA of the chest 03/04/2019; prior thyroid ultrasound 07/11/2018; MRI abdomen 05/26/2018   FINDINGS: CTA CHEST FINDINGS   Cardiovascular: Initial noncontrast enhanced  images demonstrates no evidence of acute intramural hematoma. Following the administration of intravenous contrast, there is adequate opacification of the thoracic aorta and branch vessels. Normal caliber aortic root and ascending thoracic aorta. No evidence of aneurysm. Conventional 3 vessel arch anatomy. Elongation of the aortic isthmus and proximal descending thoracic aorta resulting in a type 3 arch. Scattered atherosclerotic vascular calcifications. No evidence of dissection. Calcifications visualized along the coronary arteries. The heart is normal in size. No pericardial effusion. Normal caliber main pulmonary artery.   Mediastinum/Nodes: Right-sided thyroid nodule is nonspecific by CT imaging. This has been previously imaged with ultrasound and patient underwent biopsy on 07/11/2018. No mediastinal mass or adenopathy. Unremarkable esophagus.   Lungs/Pleura: Mild dependent atelectasis. The lungs are otherwise clear.   Musculoskeletal: No  acute fracture or aggressive appearing lytic or blastic osseous lesion.   Review of the MIP images confirms the above findings.   CTA ABDOMEN AND PELVIS FINDINGS   VASCULAR   Aorta: Scattered calcified atherosclerotic plaque. No evidence of dissection or aneurysm.   Celiac: Mild to moderate narrowing of the proximal celiac axis with mild poststenotic dilation 21.4 cm. Questionable beaded appearance of the proximal celiac artery in the region of narrowing. Differential considerations include fibromuscular dysplasia versus median arcuate ligament compression.   SMA: Patent without evidence of aneurysm, dissection, vasculitis or significant stenosis.   Renals: Solitary left kidney. Minimal atherosclerotic plaque without stenosis, dissection, aneurysm or changes of fibromuscular dysplasia.   IMA: Patent without evidence of aneurysm, dissection, vasculitis or significant stenosis.   Inflow: Patent without evidence of aneurysm, dissection, vasculitis or significant stenosis.   Veins: No obvious venous abnormality within the limitations of this arterial phase study.   Review of the MIP images confirms the above findings.   NON-VASCULAR   Hepatobiliary: Stable wedge-shaped defect in the posterior periphery of hepatic segment 6 compared to prior imaging from January. No discrete hepatic lesion. Gallbladder is unremarkable. No intra or extrahepatic biliary ductal dilatation.   Pancreas: Diffusely edematous appearance of the pancreas, particularly the pancreatic head. No ductal dilatation. Inflammatory stranding surrounds the entirety of the pancreas and extends into the porta hepatis and mesenteric root. No pseudocyst or focal fluid collection.   Spleen: Normal in size without focal abnormality.   Adrenals/Urinary Tract: Normal adrenal glands. Absent right kidney. Left kidney is normal in caliber without evidence of hydronephrosis or nephrolithiasis. Circumscribed  low-attenuation renal lesions consistent with cysts of varying complexity. No change compared to prior imaging. Specifically, the intermediate attenuation cystic structure exophytic from the lateral lower pole of the left kidney demonstrates no significant interval change dating back to July of 2019. On prior MRI evaluation this was found to be most consistent with a complex proteinaceous/hemorrhagic cyst.   Stomach/Bowel: 2 large duodenal diverticula are present arising from the D2 and D3 segments. Similar findings were seen previously. Secondary inflammatory changes noted along the gastric antrum and duodenum. Moderately large amount of formed stool in the rectum consistent with constipation. There are a few scattered colonic diverticula.   Lymphatic: No suspicious lymphadenopathy.   Reproductive: Prostate is unremarkable.   Other: Fat containing left inguinal hernia.  No free fluid.   Musculoskeletal: No acute fracture or aggressive appearing lytic or blastic osseous lesion. T12 and L1 compression fractures status post cement augmentation. Additional compression fracture at T7. No acute abnormality. Multilevel degenerative changes most significant at L5-S1.   Review of the MIP images confirms the above findings.   IMPRESSION: 1. CT findings most consistent with  at least moderately severe acute pancreatitis. Diffusely edematous pancreas, particularly the pancreatic head with extensive inflammatory stranding in the peripancreatic soft tissues extending into the porta hepatis and mesenteric root. No evidence of pseudocyst, free fluid or vascular complication at this time. 2. Similar appearance of duodenal diverticula. 3. Additional ancillary findings as above.     Electronically Signed   By: Jacqulynn Cadet M.D.   On: 01/23/2021 06:26   CLINICAL DATA:  Follow-up renal cell carcinoma. Previous right nephrectomy.   EXAM: CT ABDOMEN AND PELVIS WITH CONTRAST    TECHNIQUE: Multidetector CT imaging of the abdomen and pelvis was performed using the standard protocol following bolus administration of intravenous contrast.   CONTRAST:  46m OMNIPAQUE IOHEXOL 350 MG/ML SOLN   COMPARISON:  11/09/2020   FINDINGS: Lower Chest: No acute findings.   Hepatobiliary: No hepatic masses identified. Stable wedge-shaped low-attenuation defect in the posterior right hepatic lobe, consistent with old infarct or scar. Gallbladder is unremarkable. No evidence of biliary ductal dilatation.   Pancreas:  No mass or inflammatory changes.   Spleen: Within normal limits in size and appearance.   Adrenals/Urinary Tract: Stable postop changes from previous right nephrectomy. Stable small left renal cysts again noted, however there is no evidence of left renal mass or hydronephrosis. Mild distention and wall thickening of urinary bladder is again seen, consistent with chronic bladder outlet obstruction.   Stomach/Bowel: No evidence of obstruction, inflammatory process or abnormal fluid collections. Multiple duodenal diverticula are again demonstrated. Normal appendix visualized. Colonic diverticulosis again seen, without evidence of diverticulitis.   Vascular/Lymphatic: No pathologically enlarged lymph nodes. No acute vascular findings. Aortic atherosclerotic calcification noted.   Reproductive:  Stable moderately enlarged prostate gland.   Other:  Stable small left inguinal hernia, which contains only fat.   Musculoskeletal:  No suspicious bone lesions identified.   IMPRESSION: Previous right nephrectomy. No evidence of recurrent or metastatic carcinoma within the abdomen or pelvis.   Stable moderately enlarged prostate, and findings of chronic bladder outlet obstruction.   Colonic diverticulosis, without radiographic evidence of diverticulitis.   Stable small left inguinal hernia containing only fat.   Aortic Atherosclerosis (ICD10-I70.0).      Electronically Signed   By: JMarlaine HindM.D.   On: 06/09/2021 21:16  I have personally reviewed the images and agree with radiologist interpretation.    Assessment & Plan:    Carcinoma, renal cell, right (HCC) -No evidence of metastatic or recurrent disease -Per NCCN guidelines, will continue annual surveillance imaging for a total of 3 years, his last scan is next year  He is agreeable to this plan -Continue solitary kidney precautions -RTC in 1 year for CXR and CT A/P w/ constrast.   Liver lesion  - Benign liver MRI  - continue annual follow-up with imaging  Follow-up in  year for CT/CXR  IConley Rollsas a scribe for AHollice Espy MD.,have documented all relevant documentation on the behalf of AHollice Espy MD,as directed by  AHollice Espy MD while in the presence of AHollice Espy MD.  I have reviewed the above documentation for accuracy and completeness, and I agree with the above.   AHollice Espy MD    [facility] [address] SLawteyBBonita [place] [phone]

## 2021-06-14 ENCOUNTER — Ambulatory Visit
Admission: RE | Admit: 2021-06-14 | Discharge: 2021-06-14 | Disposition: A | Discharge status: Home / Self Care | Payer: Medicare HMO | Attending: Urology | Admitting: Urology

## 2021-06-14 ENCOUNTER — Ambulatory Visit
Admission: RE | Admit: 2021-06-14 | Discharge: 2021-06-14 | Disposition: A | Discharge status: Home / Self Care | Payer: Medicare HMO | Source: Ambulatory Visit | Attending: Urology | Admitting: Urology

## 2021-06-14 ENCOUNTER — Ambulatory Visit: Payer: Medicare HMO | Admitting: Urology

## 2021-06-14 ENCOUNTER — Other Ambulatory Visit: Payer: Self-pay

## 2021-06-14 ENCOUNTER — Encounter: Payer: Self-pay | Admitting: Urology

## 2021-06-14 VITALS — BP 150/83 | HR 77 | Ht 68.0 in | Wt 152.0 lb

## 2021-06-14 DIAGNOSIS — C641 Malignant neoplasm of right kidney, except renal pelvis: Secondary | ICD-10-CM

## 2021-09-05 ENCOUNTER — Other Ambulatory Visit: Payer: Self-pay

## 2021-09-05 ENCOUNTER — Inpatient Hospital Stay: Payer: Medicare HMO | Attending: Oncology

## 2021-09-05 DIAGNOSIS — D649 Anemia, unspecified: Secondary | ICD-10-CM | POA: Insufficient documentation

## 2021-09-05 DIAGNOSIS — E538 Deficiency of other specified B group vitamins: Secondary | ICD-10-CM | POA: Diagnosis present

## 2021-09-05 MED ORDER — CYANOCOBALAMIN 1000 MCG/ML IJ SOLN
1000.0000 ug | Freq: Once | INTRAMUSCULAR | Status: AC
  Administered 2021-09-05: 1000 ug via INTRAMUSCULAR
  Filled 2021-09-05: qty 1

## 2021-09-15 ENCOUNTER — Other Ambulatory Visit: Payer: Self-pay

## 2021-09-15 ENCOUNTER — Emergency Department: Payer: Medicare HMO

## 2021-09-15 ENCOUNTER — Emergency Department
Admission: EM | Admit: 2021-09-15 | Discharge: 2021-09-15 | Disposition: A | Discharge status: Home / Self Care | Payer: Medicare HMO | Attending: Emergency Medicine | Admitting: Emergency Medicine

## 2021-09-15 ENCOUNTER — Encounter: Payer: Self-pay | Admitting: Emergency Medicine

## 2021-09-15 DIAGNOSIS — Z85520 Personal history of malignant carcinoid tumor of kidney: Secondary | ICD-10-CM | POA: Insufficient documentation

## 2021-09-15 DIAGNOSIS — S0181XA Laceration without foreign body of other part of head, initial encounter: Secondary | ICD-10-CM | POA: Diagnosis not present

## 2021-09-15 DIAGNOSIS — J45909 Unspecified asthma, uncomplicated: Secondary | ICD-10-CM | POA: Diagnosis not present

## 2021-09-15 DIAGNOSIS — Z85828 Personal history of other malignant neoplasm of skin: Secondary | ICD-10-CM | POA: Insufficient documentation

## 2021-09-15 DIAGNOSIS — F0280 Dementia in other diseases classified elsewhere without behavioral disturbance: Secondary | ICD-10-CM | POA: Diagnosis not present

## 2021-09-15 DIAGNOSIS — G20 Parkinson's disease: Secondary | ICD-10-CM | POA: Insufficient documentation

## 2021-09-15 DIAGNOSIS — W01198A Fall on same level from slipping, tripping and stumbling with subsequent striking against other object, initial encounter: Secondary | ICD-10-CM | POA: Insufficient documentation

## 2021-09-15 DIAGNOSIS — S0990XA Unspecified injury of head, initial encounter: Secondary | ICD-10-CM | POA: Diagnosis present

## 2021-09-15 DIAGNOSIS — Z79899 Other long term (current) drug therapy: Secondary | ICD-10-CM | POA: Insufficient documentation

## 2021-09-15 DIAGNOSIS — I10 Essential (primary) hypertension: Secondary | ICD-10-CM | POA: Diagnosis not present

## 2021-09-15 NOTE — ED Provider Notes (Signed)
[facility] Emergency Department Provider Note   ____________________________________________    I have reviewed the triage vital signs and the nursing notes.   HISTORY  Chief Complaint Fall and Laceration     HPI Kevin [name] is a 87 y.o. male who presents after a fall.  Patient with history of Parkinson's, reports mechanical fall, fell forward did strike his head.  Denies neck pain.  No chest pain abdominal pain.  No extremity injuries reported.  Did suffer a laceration above his right eye.  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   Cataract cortical, senile    Colon polyps    Dementia ([place])    Parkinson's, loss of short term memory   E coli infection 2020   blood stream   GERD (gastroesophageal reflux disease)    HOH (hard of hearing)    extremely HOH, wears aids   Hyperlipidemia    Hypertension    Osteoporosis    Pneumonia    AS A CHILD    Patient Active Problem List   Diagnosis Date Noted   Acute pancreatitis 01/23/2021   Hypokalemia 01/23/2021   Dementia ([place])    GERD (gastroesophageal reflux disease)    Hypertension    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[place])    Anemia 10/17/2020   Leukopenia 10/17/2020   Thrombocytopenia ([place]) 10/17/2020   CAP (community acquired pneumonia) 03/04/2019   Right renal mass 11/17/2018    Past Surgical History:  Procedure Laterality Date   BACK SURGERY     CATARACT EXTRACTION W/PHACO Left 05/16/2020   Procedure: CATARACT EXTRACTION PHACO AND INTRAOCULAR LENS PLACEMENT (IOC) LEFT 7.27 00:39.0 ;  Surgeon: Birder Robson, MD;  Location: [facility];  Service: Ophthalmology;  Laterality: Left;   CATARACT EXTRACTION W/PHACO Right 06/07/2020   Procedure: CATARACT EXTRACTION PHACO AND  INTRAOCULAR LENS PLACEMENT (IOC) RIGHT 11.56 00:54.9;  Surgeon: Birder Robson, MD;  Location: [facility];  Service: Ophthalmology;  Laterality: Right;   COLONOSCOPY  2001, 2006, 2012   ESOPHAGOGASTRODUODENOSCOPY  09/11/2000   FLEXIBLE SIGMOIDOSCOPY  03/02/1997   KYPHOPLASTY N/A 11/13/2019   Procedure: T12 KYPHOPLASTY;  Surgeon: Hessie Knows, MD;  Location: [facility];  Service: Orthopedics;  Laterality: N/A;   KYPHOPLASTY N/A 12/03/2019   Procedure: L1 KYPHOPLASTY;  Surgeon: Hessie Knows, MD;  Location: [facility];  Service: Orthopedics;  Laterality: N/A;   KYPHOPLASTY N/A 02/28/2021   Procedure: T9 & T10 KYPHOPLASTY;  Surgeon: Hessie Knows, MD;  Location: [facility];  Service: Orthopedics;  Laterality: N/A;   NASAL POLYP EXCISION     NEPHRECTOMY     ROBOT ASSISTED LAPAROSCOPIC NEPHRECTOMY Right 11/17/2018   Procedure: ROBOTIC ASSISTED LAPAROSCOPIC NEPHRECTOMY;  Surgeon: Hollice Espy, MD;  Location: [facility];  Service: Urology;  Laterality: Right;    Prior to Admission medications   Medication Sig Start Date End Date Taking? Authorizing Provider  alendronate (FOSAMAX) 70 MG tablet Take 70 mg by mouth once a week. Saturday    [provider]  amLODipine (NORVASC) 5 MG tablet Take 2 tablets (10 mg total) by mouth daily. 01/26/21 06/05/21  Dessa Phi, DO  atorvastatin (LIPITOR) 20 MG tablet Take 1 tablet (20 mg total) by mouth daily.  01/26/21 06/05/21  Dessa Phi, DO  carbidopa-levodopa (SINEMET IR) 25-100 MG tablet Take 1-1.5 tablets by mouth See admin instructions. Take 1.5 tablet (0800) by mouth in the morning, then 1.5 tablet (1200) by mouth at noon, then 1.5 tablet by mouth at 5 pm (1700), then 1 tablet by mouth at bedtime (2200), then 1 tablet at 0300. 06/15/19   [provider]  escitalopram (LEXAPRO) 10 MG tablet Take 10 mg by mouth daily.    [provider]  Multiple Vitamins-Minerals (PRESERVISION AREDS 2 PO) Take 1 tablet by mouth daily.     [provider]  nystatin cream (MYCOSTATIN) Apply 1 application topically daily as needed for dry skin (on bottom).    [provider]  psyllium (METAMUCIL) 0.52 g capsule Take 0.52 g by mouth daily as needed (Constipation).    [provider]  rivastigmine (EXELON) 3 MG capsule Take 3 mg by mouth 2 (two) times daily with a meal.    [provider]  sennosides-docusate sodium (SENOKOT-S) 8.6-50 MG tablet Take 1 tablet by mouth daily as needed for constipation.    [provider]  traMADol (ULTRAM) 50 MG tablet Take 1 tablet (50 mg total) by mouth every 6 (six) hours as needed. 02/28/21   Hessie Knows, MD  vitamin B-12 (CYANOCOBALAMIN) 1000 MCG tablet Take 1 tablet (1,000 mcg total) by mouth daily. 10/17/20   Earlie Server, MD     Allergies Patient has no known allergies.  Family History  Problem Relation Age of Onset   Heart disease Mother    Pancreatitis Mother    Emphysema Father    Breast cancer Sister    Breast cancer Sister     Social History Social History   Tobacco Use   Smoking status: Never   Smokeless tobacco: Never  Vaping Use   Vaping Use: Never used  Substance Use Topics   Alcohol use: Never   Drug use: Never    Review of Systems  Constitutional: No fever/chills  ENT: No sore throat.   Gastrointestinal: No abdominal pain.  No nausea, no vomiting.   Genitourinary: Negative for dysuria. Musculoskeletal: Negative for back pain. Skin: Negative for rash. Neurological: Negative for headaches     ____________________________________________   PHYSICAL EXAM:  VITAL SIGNS: ED Triage Vitals  Enc Vitals Group     BP 09/15/21 0951 139/82     Pulse Rate 09/15/21 0951 73     Resp 09/15/21 0951 18     Temp 09/15/21 0951 98 F (36.7 C)     Temp Source 09/15/21 0951 Oral     SpO2 09/15/21 0951 98 %     Weight 09/15/21 0946 70 kg (154 lb 5.2 oz)     Height 09/15/21 0946 1.727 m (5\' 8" )     Head Circumference --       Peak Flow --      Pain Score 09/15/21 0945 4     Pain Loc --      Pain Edu? --      Excl. in [place]? --      Constitutional: Alert and oriented. No acute distress. Pleasant and interactive Eyes: Conjunctivae are normal.  Head: Laceration approximately 1.5 cm to the right lateral forehead just above the orbit Nose: No congestion/rhinnorhea. Mouth/Throat: Mucous membranes are moist.   Cardiovascular: Normal rate, regular rhythm.  No chest wall tenderness palpation Respiratory: Normal respiratory effort.  No retractions.  Musculoskeletal: No discomfort with range of motion of upper or lower extremities,  no vertebral tenderness palpation. Neurologic:  Normal speech and language. No gross focal neurologic deficits are appreciated.   Skin:  Skin is warm, dry    ____________________________________________   LABS (all labs ordered are listed, but only abnormal results are displayed)  Labs Reviewed - No data to display ____________________________________________  EKG   ____________________________________________  RADIOLOGY  CT head and cervical spine, reviewed by me, no acute abnormality ____________________________________________   PROCEDURES  Procedure(s) performed: yes  .Marland KitchenLaceration Repair  Date/Time: 09/15/2021 3:55 PM Performed by: Lavonia Drafts, MD Authorized by: Lavonia Drafts, MD   Consent:    Consent obtained:  Verbal   Consent given by:  Patient   Alternatives discussed:  No treatment Anesthesia:    Anesthesia method:  None Laceration details:    Location:  Scalp   Scalp location:  Frontal Pre-procedure details:    Preparation:  Patient was prepped and draped in usual sterile fashion Exploration:    Limited defect created (wound extended): no   Treatment:    Amount of cleaning:  Standard Skin repair:    Repair method:  Tissue adhesive Approximation:    Approximation:  Close Repair type:    Repair type:  Simple   Critical Care performed:  No ____________________________________________   INITIAL IMPRESSION / ASSESSMENT AND PLAN / ED COURSE  Pertinent labs & imaging results that were available during my care of the patient were reviewed by me and considered in my medical decision making (see chart for details).   Patient with mechanical fall as above, CT head and cervical spine imaging is quite reassuring.  Repaired forehead laceration with Dermabond, appropriate for discharge outpatient follow-up as needed   ____________________________________________   FINAL CLINICAL IMPRESSION(S) / ED DIAGNOSES  Final diagnoses:  Facial laceration, initial encounter  Injury of head, initial encounter      NEW MEDICATIONS STARTED DURING THIS VISIT:  Discharge Medication List as of 09/15/2021 11:44 AM       Note:  This document was prepared using Dragon voice recognition software and may include unintentional dictation errors.    Lavonia Drafts, MD 09/15/21 1556

## 2021-09-15 NOTE — ED Triage Notes (Signed)
Pt reports has parkinsons, was walking and his foot got tripped up and he fell hitting his head. Pt reports was at a restaurant going in to eat breakfast when he fell. Pt states an off duty EMT told him he needed stitches. Pt denies LOC. Pt with laceration above right eye, bleeding controlled.

## 2021-11-30 ENCOUNTER — Ambulatory Visit: Payer: Self-pay | Admitting: General Surgery

## 2021-11-30 NOTE — H&P (View-Only) (Signed)
PATIENT PROFILE: Kevin [name] is a 88 y.o. male who presents to the Clinic for reevaluation of left inguinal hernia.  PCP:  Velna Ochs, MD  HISTORY OF PRESENT ILLNESS: Kevin [name] reports he continued having left inguinal pain.  Pain radiated to the testicle.  Pain has been increasing in intensity.  Pain mainly aggravated by bending.  He cannot identify any alleviating factors.  Patient has also seen a bulge in the left groin.  Denies any symptoms in the right groin.  Denies any abdominal extension nausea or vomiting.   PROBLEM LIST: Problem List  Date Reviewed: 11/28/2021          Noted   Dementia (CMS-HCC) 03/24/2021   Overview    Formatting of this note might be different from the original. Parkinson's, loss of short term memory      GERD (gastroesophageal reflux disease) 03/24/2021   Hypokalemia 01/23/2021   Anemia 10/17/2020   Leukopenia 10/17/2020   Thrombocytopenia (CMS-HCC) 10/17/2020   Tremor 08/05/2019   Parkinson disease (CMS-HCC) 06/15/2019   Renal mass, right 08/11/2018   History of adenomatous polyp of colon 04/01/2018   Hypertension 08/09/2014   Asthma without status asthmaticus, unspecified 08/09/2014   Hyperlipidemia with target LDL less than 130 08/09/2014    GENERAL REVIEW OF SYSTEMS:   General ROS: negative for - chills, fatigue, fever, weight gain or weight loss Allergy and Immunology ROS: negative for - hives  Hematological and Lymphatic ROS: negative for - bleeding problems or bruising, negative for palpable nodes Endocrine ROS: negative for - heat or cold intolerance, hair changes Respiratory ROS: negative for - cough, shortness of breath or wheezing Cardiovascular ROS: no chest pain or palpitations GI ROS: negative for nausea, vomiting, abdominal pain, diarrhea, constipation Musculoskeletal ROS: negative for - joint swelling or muscle pain Neurological ROS: negative for - confusion, syncope Dermatological ROS: negative for pruritus and  rash Psychiatric: negative for anxiety, depression, difficulty sleeping and memory loss  MEDICATIONS: Current Outpatient Medications  Medication Sig Dispense Refill   acetaminophen (TYLENOL) 325 MG tablet Take 650 mg by mouth every 6 (six) hours as needed for Pain     albuterol 90 mcg/actuation inhaler Inhale 2 inhalations into the lungs every 6 (six) hours as needed for Wheezing 1 each 3   alendronate (FOSAMAX) 70 MG tablet TAKE 1 TABLET EVERY 7 DAYS WITH A FULL GLASS OF WATER.DO NOT LIE DOWN FOR THE    NEXT 30 MINUTES 12 tablet 1   amLODIPine (NORVASC) 10 MG tablet Take 1 tablet (10 mg total) by mouth once daily 90 tablet 3   atorvastatin (LIPITOR) 20 MG tablet Take 1 tablet (20 mg total) by mouth once daily 90 tablet 1   carbidopa-levodopa (SINEMET) 25-100 mg tablet (8:00 am (1.5 pills),12:00 pm (1.5 pills) ,5:00 pm (1.5 pills ),10:00 pm (1 pill) and an additional dose in the middle of the night (1 pills). 585 tablet 3   cyanocobalamin (VITAMIN B12) 1000 MCG tablet Take by mouth     diclofenac (VOLTAREN) 1 % topical gel Apply 2 g topically 4 (four) times daily     escitalopram oxalate (LEXAPRO) 10 MG tablet TAKE 1 TABLET ONCE DAILY 90 tablet 3   nystatin (MYCOSTATIN) 100,000 unit/gram cream Apply topically 2 (two) times daily 30 g 2   psyllium (METAMUCIL) 0.52 gram capsule Take 1 capsule by mouth once daily At noon     rivastigmine tartrate (EXELON) 3 MG capsule Take 1 capsule (3 mg total) by mouth 2 (two)  times daily with meals for 360 days 180 capsule 3   sennosides (SENOKOT ORAL) Take by mouth     No current facility-administered medications for this visit.    ALLERGIES: Patient has no known allergies.  PAST MEDICAL HISTORY: Past Medical History:  Diagnosis Date   Allergic state all my life   Anemia 10/17/2020   Arthritis some   Asthma without status asthmaticus, unspecified since birth   Bifascicular block    Cancer (CMS-HCC) skin   Cataract cortical, senile unknown   Colon  polyps    Congenital foot deformity    Dementia (CMS-HCC) 03/24/2021   Dermatophytosis of nail    FH: colon cancer 04/01/2018   GERD (gastroesophageal reflux disease)    History of adenomatous polyp of colon 04/01/2018   History of colonic polyps    Hyperlipidemia 20 years   Hypertension 20+years   Parkinson's disease (CMS-HCC)    Tremor Parkinson's   Weight loss, unintentional 04/01/2018    PAST SURGICAL HISTORY: Past Surgical History:  Procedure Laterality Date   FLEXIBLE SIGMOIDOSCOPY  03/02/1997   Adenomatous Polyp   EGD  09/11/2000   COLONOSCOPY  02/05/2011   07/27/2005, 09/11/2000; Adenomatous Polyps: CBF 01/2016; No repeat due to age per RTE (dw)   kidney removal  Right 10/2018   BACK SURGERY  2-21   KYPHOPLASTY N/A 12/03/2019   Dr. Rudene Christians   EXCISION NASAL POLYPS     nose straightened       FAMILY HISTORY: Family History  Problem Relation Age of Onset   High blood pressure (Hypertension) Other    Asthma Father    Colon polyps Sister    Breast cancer Sister    High blood pressure (Hypertension) Mother      SOCIAL HISTORY: Social History   Socioeconomic History   Marital status: Married  Tobacco Use   Smoking status: Never   Smokeless tobacco: Never  Vaping Use   Vaping Use: Never used  Substance and Sexual Activity   Alcohol use: No    Alcohol/week: 0.0 standard drinks   Drug use: No   Sexual activity: Not Currently    PHYSICAL EXAM: Vitals:   11/30/21 1001  BP: 129/78  Pulse: 75   Body mass index is 22.81 kg/m. Weight: 68 kg (150 lb)   GENERAL: Alert, active, oriented x3  HEENT: Pupils equal reactive to light. Extraocular movements are intact. Sclera clear. Palpebral conjunctiva normal red color.Pharynx clear.  NECK: Supple with no palpable mass and no adenopathy.  LUNGS: Sound clear with no rales rhonchi or wheezes.  HEART: Regular rhythm S1 and S2 without murmur.  ABDOMEN: Soft and depressible, nontender with no palpable mass, no  hepatomegaly.  Left inguinal hernia, reducible.  Mild to moderately tender to reduction.  EXTREMITIES: Well-developed well-nourished symmetrical with no dependent edema.  NEUROLOGICAL: Awake alert oriented, facial expression symmetrical, moving all extremities.  REVIEW OF DATA: I have reviewed the following data today: Appointment on 11/21/2021  Component Date Value   Glucose 11/21/2021 83    Sodium 11/21/2021 139    Potassium 11/21/2021 4.2    Chloride 11/21/2021 105    Carbon Dioxide (CO2) 11/21/2021 28.7    Calcium 11/21/2021 9.1    Urea Nitrogen (BUN) 11/21/2021 25    Creatinine 11/21/2021 1.0    Glomerular Filtration Ra* 11/21/2021 71    BUN/Crea Ratio 11/21/2021 25.0 (H)    Anion Gap w/K 11/21/2021 9.5    WBC (White Blood Cell Co* 11/21/2021 4.0 (L)    RBC (  Red Blood Cell Coun* 11/21/2021 4.07 (L)    Hemoglobin 11/21/2021 12.3 (L)    Hematocrit 11/21/2021 37.3 (L)    MCV (Mean Corpuscular Vo* 11/21/2021 91.6    MCH (Mean Corpuscular He* 11/21/2021 30.2    MCHC (Mean Corpuscular H* 11/21/2021 33.0    Platelet Count 11/21/2021 141 (L)    RDW-CV (Red Cell Distrib* 11/21/2021 13.2    MPV (Mean Platelet Volum* 11/21/2021 10.8    Neutrophils 11/21/2021 2.52    Lymphocytes 11/21/2021 0.98 (L)    Monocytes 11/21/2021 0.44    Eosinophils 11/21/2021 0.05    Basophils 11/21/2021 0.01    Neutrophil % 11/21/2021 62.9    Lymphocyte % 11/21/2021 24.5    Monocyte % 11/21/2021 11.0    Eosinophil % 11/21/2021 1.3    Basophil% 11/21/2021 0.3    Immature Granulocyte % 11/21/2021 0.0    Immature Granulocyte Cou* 11/21/2021 0.00      ASSESSMENT: Mr. Aumiller is a 88 y.o. male presenting for consultation for left inguinal hernia.    The patient presents with a symptomatic and worsening pain, reducible left inguinal hernia. Patient was oriented about the diagnosis of inguinal hernia and its implication. The patient was oriented about the treatment alternatives (observation vs surgical  repair). Due to patient symptoms, repair is recommended. Patient oriented about the surgical procedure, the use of mesh and its risk of complications such as: infection, bleeding, injury to vas deference, vasculature and testicle, injury to bowel or bladder, and chronic pain.   Since the symptoms on the left and the hernia are increasing in size and is getting more difficult to reduce the hernia I think that is reasonable to proceed with elective inguinal hernia repair.  I think that it would be more risky to continue observation if patient develop incarceration and needs urgent/emergent surgery.  As per wife and previous neurology notes, Parkinson has been stable.  They understand the risk of deterioration from the Parkinson's standpoint.  They still want to proceed with surgical management.  Non-recurrent unilateral inguinal hernia without obstruction or gangrene [K40.90]  PLAN: 1.  Robotic assisted laparoscopic left inguinal hernia repair with mesh (39030) 2.  CBC, BMP done 3.  Avoid taking aspirin 5 days before procedure 4.  Neurology clearance (Dr. Manuella Ghazi) 5.  Contact us if has any question or concern.   Patient and and his wife verbalized understanding, all questions were answered, and were agreeable with the plan outlined above.    Herbert Pun, MD  Electronically signed by Herbert Pun, MD

## 2021-11-30 NOTE — H&P (Signed)
PATIENT PROFILE: Kevin [name] is a 88 y.o. male who presents to the Clinic for reevaluation of left inguinal hernia.  PCP:  Velna Ochs, MD  HISTORY OF PRESENT ILLNESS: Kevin [name] reports he continued having left inguinal pain.  Pain radiated to the testicle.  Pain has been increasing in intensity.  Pain mainly aggravated by bending.  He cannot identify any alleviating factors.  Patient has also seen a bulge in the left groin.  Denies any symptoms in the right groin.  Denies any abdominal extension nausea or vomiting.   PROBLEM LIST: Problem List  Date Reviewed: 11/28/2021          Noted   Dementia (CMS-HCC) 03/24/2021   Overview    Formatting of this note might be different from the original. Parkinson's, loss of short term memory      GERD (gastroesophageal reflux disease) 03/24/2021   Hypokalemia 01/23/2021   Anemia 10/17/2020   Leukopenia 10/17/2020   Thrombocytopenia (CMS-HCC) 10/17/2020   Tremor 08/05/2019   Parkinson disease (CMS-HCC) 06/15/2019   Renal mass, right 08/11/2018   History of adenomatous polyp of colon 04/01/2018   Hypertension 08/09/2014   Asthma without status asthmaticus, unspecified 08/09/2014   Hyperlipidemia with target LDL less than 130 08/09/2014    GENERAL REVIEW OF SYSTEMS:   General ROS: negative for - chills, fatigue, fever, weight gain or weight loss Allergy and Immunology ROS: negative for - hives  Hematological and Lymphatic ROS: negative for - bleeding problems or bruising, negative for palpable nodes Endocrine ROS: negative for - heat or cold intolerance, hair changes Respiratory ROS: negative for - cough, shortness of breath or wheezing Cardiovascular ROS: no chest pain or palpitations GI ROS: negative for nausea, vomiting, abdominal pain, diarrhea, constipation Musculoskeletal ROS: negative for - joint swelling or muscle pain Neurological ROS: negative for - confusion, syncope Dermatological ROS: negative for pruritus and  rash Psychiatric: negative for anxiety, depression, difficulty sleeping and memory loss  MEDICATIONS: Current Outpatient Medications  Medication Sig Dispense Refill   acetaminophen (TYLENOL) 325 MG tablet Take 650 mg by mouth every 6 (six) hours as needed for Pain     albuterol 90 mcg/actuation inhaler Inhale 2 inhalations into the lungs every 6 (six) hours as needed for Wheezing 1 each 3   alendronate (FOSAMAX) 70 MG tablet TAKE 1 TABLET EVERY 7 DAYS WITH A FULL GLASS OF WATER.DO NOT LIE DOWN FOR THE    NEXT 30 MINUTES 12 tablet 1   amLODIPine (NORVASC) 10 MG tablet Take 1 tablet (10 mg total) by mouth once daily 90 tablet 3   atorvastatin (LIPITOR) 20 MG tablet Take 1 tablet (20 mg total) by mouth once daily 90 tablet 1   carbidopa-levodopa (SINEMET) 25-100 mg tablet (8:00 am (1.5 pills),12:00 pm (1.5 pills) ,5:00 pm (1.5 pills ),10:00 pm (1 pill) and an additional dose in the middle of the night (1 pills). 585 tablet 3   cyanocobalamin (VITAMIN B12) 1000 MCG tablet Take by mouth     diclofenac (VOLTAREN) 1 % topical gel Apply 2 g topically 4 (four) times daily     escitalopram oxalate (LEXAPRO) 10 MG tablet TAKE 1 TABLET ONCE DAILY 90 tablet 3   nystatin (MYCOSTATIN) 100,000 unit/gram cream Apply topically 2 (two) times daily 30 g 2   psyllium (METAMUCIL) 0.52 gram capsule Take 1 capsule by mouth once daily At noon     rivastigmine tartrate (EXELON) 3 MG capsule Take 1 capsule (3 mg total) by mouth 2 (two)  times daily with meals for 360 days 180 capsule 3   sennosides (SENOKOT ORAL) Take by mouth     No current facility-administered medications for this visit.    ALLERGIES: Patient has no known allergies.  PAST MEDICAL HISTORY: Past Medical History:  Diagnosis Date   Allergic state all my life   Anemia 10/17/2020   Arthritis some   Asthma without status asthmaticus, unspecified since birth   Bifascicular block    Cancer (CMS-HCC) skin   Cataract cortical, senile unknown   Colon  polyps    Congenital foot deformity    Dementia (CMS-HCC) 03/24/2021   Dermatophytosis of nail    FH: colon cancer 04/01/2018   GERD (gastroesophageal reflux disease)    History of adenomatous polyp of colon 04/01/2018   History of colonic polyps    Hyperlipidemia 20 years   Hypertension 20+years   Parkinson's disease (CMS-HCC)    Tremor Parkinson's   Weight loss, unintentional 04/01/2018    PAST SURGICAL HISTORY: Past Surgical History:  Procedure Laterality Date   FLEXIBLE SIGMOIDOSCOPY  03/02/1997   Adenomatous Polyp   EGD  09/11/2000   COLONOSCOPY  02/05/2011   07/27/2005, 09/11/2000; Adenomatous Polyps: CBF 01/2016; No repeat due to age per RTE (dw)   kidney removal  Right 10/2018   BACK SURGERY  2-21   KYPHOPLASTY N/A 12/03/2019   Dr. Rudene Christians   EXCISION NASAL POLYPS     nose straightened       FAMILY HISTORY: Family History  Problem Relation Age of Onset   High blood pressure (Hypertension) Other    Asthma Father    Colon polyps Sister    Breast cancer Sister    High blood pressure (Hypertension) Mother      SOCIAL HISTORY: Social History   Socioeconomic History   Marital status: Married  Tobacco Use   Smoking status: Never   Smokeless tobacco: Never  Vaping Use   Vaping Use: Never used  Substance and Sexual Activity   Alcohol use: No    Alcohol/week: 0.0 standard drinks   Drug use: No   Sexual activity: Not Currently    PHYSICAL EXAM: Vitals:   11/30/21 1001  BP: 129/78  Pulse: 75   Body mass index is 22.81 kg/m. Weight: 68 kg (150 lb)   GENERAL: Alert, active, oriented x3  HEENT: Pupils equal reactive to light. Extraocular movements are intact. Sclera clear. Palpebral conjunctiva normal red color.Pharynx clear.  NECK: Supple with no palpable mass and no adenopathy.  LUNGS: Sound clear with no rales rhonchi or wheezes.  HEART: Regular rhythm S1 and S2 without murmur.  ABDOMEN: Soft and depressible, nontender with no palpable mass, no  hepatomegaly.  Left inguinal hernia, reducible.  Mild to moderately tender to reduction.  EXTREMITIES: Well-developed well-nourished symmetrical with no dependent edema.  NEUROLOGICAL: Awake alert oriented, facial expression symmetrical, moving all extremities.  REVIEW OF DATA: I have reviewed the following data today: Appointment on 11/21/2021  Component Date Value   Glucose 11/21/2021 83    Sodium 11/21/2021 139    Potassium 11/21/2021 4.2    Chloride 11/21/2021 105    Carbon Dioxide (CO2) 11/21/2021 28.7    Calcium 11/21/2021 9.1    Urea Nitrogen (BUN) 11/21/2021 25    Creatinine 11/21/2021 1.0    Glomerular Filtration Ra* 11/21/2021 71    BUN/Crea Ratio 11/21/2021 25.0 (H)    Anion Gap w/K 11/21/2021 9.5    WBC (White Blood Cell Co* 11/21/2021 4.0 (L)    RBC (  Red Blood Cell Coun* 11/21/2021 4.07 (L)    Hemoglobin 11/21/2021 12.3 (L)    Hematocrit 11/21/2021 37.3 (L)    MCV (Mean Corpuscular Vo* 11/21/2021 91.6    MCH (Mean Corpuscular He* 11/21/2021 30.2    MCHC (Mean Corpuscular H* 11/21/2021 33.0    Platelet Count 11/21/2021 141 (L)    RDW-CV (Red Cell Distrib* 11/21/2021 13.2    MPV (Mean Platelet Volum* 11/21/2021 10.8    Neutrophils 11/21/2021 2.52    Lymphocytes 11/21/2021 0.98 (L)    Monocytes 11/21/2021 0.44    Eosinophils 11/21/2021 0.05    Basophils 11/21/2021 0.01    Neutrophil % 11/21/2021 62.9    Lymphocyte % 11/21/2021 24.5    Monocyte % 11/21/2021 11.0    Eosinophil % 11/21/2021 1.3    Basophil% 11/21/2021 0.3    Immature Granulocyte % 11/21/2021 0.0    Immature Granulocyte Cou* 11/21/2021 0.00      ASSESSMENT: Kevin [name] is a 88 y.o. male presenting for consultation for left inguinal hernia.    The patient presents with a symptomatic and worsening pain, reducible left inguinal hernia. Patient was oriented about the diagnosis of inguinal hernia and its implication. The patient was oriented about the treatment alternatives (observation vs surgical  repair). Due to patient symptoms, repair is recommended. Patient oriented about the surgical procedure, the use of mesh and its risk of complications such as: infection, bleeding, injury to vas deference, vasculature and testicle, injury to bowel or bladder, and chronic pain.   Since the symptoms on the left and the hernia are increasing in size and is getting more difficult to reduce the hernia I think that is reasonable to proceed with elective inguinal hernia repair.  I think that it would be more risky to continue observation if patient develop incarceration and needs urgent/emergent surgery.  As per wife and previous neurology notes, Parkinson has been stable.  They understand the risk of deterioration from the Parkinson's standpoint.  They still want to proceed with surgical management.  Non-recurrent unilateral inguinal hernia without obstruction or gangrene [K40.90]  PLAN: 1.  Robotic assisted laparoscopic left inguinal hernia repair with mesh (16967) 2.  CBC, BMP done 3.  Avoid taking aspirin 5 days before procedure 4.  Neurology clearance (Dr. Manuella Ghazi) 5.  Contact us if has any question or concern.   Patient and and his wife verbalized understanding, all questions were answered, and were agreeable with the plan outlined above.    Herbert Pun, MD  Electronically signed by Herbert Pun, MD

## 2021-12-04 ENCOUNTER — Other Ambulatory Visit: Payer: Self-pay

## 2021-12-04 ENCOUNTER — Inpatient Hospital Stay: Payer: Medicare HMO | Attending: Oncology

## 2021-12-04 ENCOUNTER — Encounter
Admission: RE | Admit: 2021-12-04 | Discharge: 2021-12-04 | Disposition: A | Discharge status: Home / Self Care | Payer: Medicare HMO | Source: Ambulatory Visit | Attending: General Surgery | Admitting: General Surgery

## 2021-12-04 ENCOUNTER — Other Ambulatory Visit
Admission: RE | Admit: 2021-12-04 | Discharge: 2021-12-04 | Disposition: A | Discharge status: Home / Self Care | Payer: Medicare HMO | Source: Ambulatory Visit | Attending: General Surgery | Admitting: General Surgery

## 2021-12-04 DIAGNOSIS — D61818 Other pancytopenia: Secondary | ICD-10-CM | POA: Insufficient documentation

## 2021-12-04 DIAGNOSIS — E538 Deficiency of other specified B group vitamins: Secondary | ICD-10-CM | POA: Insufficient documentation

## 2021-12-04 DIAGNOSIS — I10 Essential (primary) hypertension: Secondary | ICD-10-CM | POA: Diagnosis not present

## 2021-12-04 DIAGNOSIS — Z01810 Encounter for preprocedural cardiovascular examination: Secondary | ICD-10-CM | POA: Insufficient documentation

## 2021-12-04 DIAGNOSIS — D649 Anemia, unspecified: Secondary | ICD-10-CM | POA: Diagnosis not present

## 2021-12-04 DIAGNOSIS — D696 Thrombocytopenia, unspecified: Secondary | ICD-10-CM | POA: Insufficient documentation

## 2021-12-04 HISTORY — DX: Chronic kidney disease, unspecified: N18.9

## 2021-12-04 HISTORY — DX: Acute pancreatitis without necrosis or infection, unspecified: K85.90

## 2021-12-04 LAB — CBC WITH DIFFERENTIAL/PLATELET
Abs Immature Granulocytes: 0.02 10*3/uL (ref 0.00–0.07)
Basophils Absolute: 0 10*3/uL (ref 0.0–0.1)
Basophils Relative: 0 %
Eosinophils Absolute: 0.1 10*3/uL (ref 0.0–0.5)
Eosinophils Relative: 1 %
HCT: 37.2 % — ABNORMAL LOW (ref 39.0–52.0)
Hemoglobin: 12.2 g/dL — ABNORMAL LOW (ref 13.0–17.0)
Immature Granulocytes: 1 %
Lymphocytes Relative: 20 %
Lymphs Abs: 0.8 10*3/uL (ref 0.7–4.0)
MCH: 30.2 pg (ref 26.0–34.0)
MCHC: 32.8 g/dL (ref 30.0–36.0)
MCV: 92.1 fL (ref 80.0–100.0)
Monocytes Absolute: 0.4 10*3/uL (ref 0.1–1.0)
Monocytes Relative: 10 %
Neutro Abs: 2.9 10*3/uL (ref 1.7–7.7)
Neutrophils Relative %: 68 %
Platelets: 148 10*3/uL — ABNORMAL LOW (ref 150–400)
RBC: 4.04 MIL/uL — ABNORMAL LOW (ref 4.22–5.81)
RDW: 13.5 % (ref 11.5–15.5)
WBC: 4.2 10*3/uL (ref 4.0–10.5)
nRBC: 0 % (ref 0.0–0.2)

## 2021-12-04 LAB — VITAMIN B12: Vitamin B-12: 662 pg/mL (ref 180–914)

## 2021-12-04 LAB — COMPREHENSIVE METABOLIC PANEL
ALT: <5 U/L (ref 0–44)
AST: 14 U/L — ABNORMAL LOW (ref 15–41)
Albumin: 4 g/dL (ref 3.5–5.0)
Alkaline Phosphatase: 44 U/L (ref 38–126)
Anion gap: 7 (ref 5–15)
BUN: 24 mg/dL — ABNORMAL HIGH (ref 8–23)
CO2: 26 mmol/L (ref 22–32)
Calcium: 8.8 mg/dL — ABNORMAL LOW (ref 8.9–10.3)
Chloride: 102 mmol/L (ref 98–111)
Creatinine, Ser: 0.93 mg/dL (ref 0.61–1.24)
GFR, Estimated: >60 mL/min (ref >60)
Glucose, Bld: 107 mg/dL — ABNORMAL HIGH (ref 70–99)
Potassium: 3.9 mmol/L (ref 3.5–5.1)
Sodium: 135 mmol/L (ref 135–145)
Total Bilirubin: 0.5 mg/dL (ref 0.3–1.2)
Total Protein: 6.5 g/dL (ref 6.5–8.1)

## 2021-12-04 NOTE — Patient Instructions (Addendum)
Your procedure is scheduled on: Wednesday December 06, 2021. Report to Day Surgery inside [facility] 2nd floor stop by admissions desk before getting on elevator, To find out your arrival time please call [phone] between 1PM - 3PM on Tuesday December 05, 2021.  Remember: Instructions that are not followed completely may result in serious medical risk,  up to and including death, or upon the discretion of your surgeon and anesthesiologist your  surgery may need to be rescheduled.     _X__ 1. Do not eat food or drink fluids after midnight the night before your procedure.                 No chewing gum or hard candies.   __X__2.  On the morning of surgery brush your teeth with toothpaste and water, you                may rinse your mouth with mouthwash if you wish.  Do not swallow any toothpaste or mouthwash.     _X__ 3.  No Alcohol for 24 hours before or after surgery.   _X__ 4.  Do Not Smoke or use e-cigarettes For 24 Hours Prior to Your Surgery.                 Do not use any chewable tobacco products for at least 6 hours prior to                 Surgery.  _X__  5.  Do not use any recreational drugs (marijuana, cocaine, heroin, ecstasy, MDMA or other)                For at least one week prior to your surgery.  Combination of these drugs with anesthesia                May have life threatening results.  ____  6.  Bring all medications with you on the day of surgery if instructed.   __X__  7.  Notify your doctor if there is any change in your medical condition      (cold, fever, infections).     Do not wear jewelry, make-up, hairpins, clips or nail polish. Do not wear lotions, powders, or perfumes. You may wear deodorant. Do not shave 48 hours prior to surgery. Men may shave face and neck. Do not bring valuables to the hospital.    [facility] is not responsible for any belongings or valuables.  Contacts, dentures or bridgework may not be worn into  surgery. Leave your suitcase in the car. After surgery it may be brought to your room. For patients admitted to the hospital, discharge time is determined by your treatment team.   Patients discharged the day of surgery will not be allowed to drive home.   Make arrangements for someone to be with you for the first 24 hours of your Same Day Discharge.   __X__ Take these medicines the morning of surgery with A SIP OF WATER:    1. amLODipine (NORVASC) 10 MG   2. carbidopa-levodopa (SINEMET IR) 25-100 MG  3. rivastigmine (EXELON) 3 MG  4.   5.  6.  ____ Fleet Enema (as directed)   __X__ Use CHG Soap (or wipes) as directed  ____ Use Benzoyl Peroxide Gel as instructed  __X__ Use inhalers on the day of surgery  albuterol (VENTOLIN HFA) 108 (90 Base) MCG/ACT inhaler  ____ Stop metformin 2 days prior to surgery    ____  Take 1/2 of usual insulin dose the night before surgery. No insulin the morning          of surgery.   ____ Call your PCP, cardiologist, or Pulmonologist if taking Coumadin/Plavix/aspirin and ask when to stop before your surgery.   __X__ One Week prior to surgery- Stop Anti-inflammatories such as Ibuprofen, Aleve, Advil, Motrin, meloxicam (MOBIC), diclofenac, etodolac, ketorolac, Toradol, Daypro, piroxicam, Goody's or BC powders. OK TO USE TYLENOL IF NEEDED   __X__ Stop supplements until after surgery.    ____ Bring C-Pap to the hospital.    If you have any questions regarding your pre-procedure instructions,  Please call Pre-admit Testing at [phone]

## 2021-12-06 ENCOUNTER — Inpatient Hospital Stay: Payer: Medicare HMO

## 2021-12-06 ENCOUNTER — Encounter: Payer: Self-pay | Admitting: General Surgery

## 2021-12-06 ENCOUNTER — Inpatient Hospital Stay: Payer: Medicare HMO | Admitting: Oncology

## 2021-12-06 ENCOUNTER — Ambulatory Visit
Admission: RE | Admit: 2021-12-06 | Discharge: 2021-12-06 | Disposition: A | Discharge status: Home / Self Care | Payer: Medicare HMO | Attending: General Surgery | Admitting: General Surgery

## 2021-12-06 ENCOUNTER — Other Ambulatory Visit: Payer: Self-pay

## 2021-12-06 ENCOUNTER — Ambulatory Visit: Payer: Medicare HMO | Admitting: Anesthesiology

## 2021-12-06 ENCOUNTER — Encounter
Admission: RE | Disposition: A | Discharge status: Home / Self Care | Payer: Self-pay | Source: Home / Self Care | Attending: General Surgery

## 2021-12-06 DIAGNOSIS — G20 Parkinson's disease: Secondary | ICD-10-CM | POA: Diagnosis not present

## 2021-12-06 DIAGNOSIS — Z85528 Personal history of other malignant neoplasm of kidney: Secondary | ICD-10-CM | POA: Diagnosis not present

## 2021-12-06 DIAGNOSIS — K4090 Unilateral inguinal hernia, without obstruction or gangrene, not specified as recurrent: Secondary | ICD-10-CM | POA: Diagnosis present

## 2021-12-06 DIAGNOSIS — F0280 Dementia in other diseases classified elsewhere without behavioral disturbance: Secondary | ICD-10-CM | POA: Diagnosis not present

## 2021-12-06 DIAGNOSIS — K219 Gastro-esophageal reflux disease without esophagitis: Secondary | ICD-10-CM | POA: Diagnosis not present

## 2021-12-06 DIAGNOSIS — Z79899 Other long term (current) drug therapy: Secondary | ICD-10-CM | POA: Diagnosis not present

## 2021-12-06 DIAGNOSIS — E785 Hyperlipidemia, unspecified: Secondary | ICD-10-CM | POA: Diagnosis not present

## 2021-12-06 DIAGNOSIS — I129 Hypertensive chronic kidney disease with stage 1 through stage 4 chronic kidney disease, or unspecified chronic kidney disease: Secondary | ICD-10-CM | POA: Diagnosis not present

## 2021-12-06 DIAGNOSIS — Z905 Acquired absence of kidney: Secondary | ICD-10-CM | POA: Insufficient documentation

## 2021-12-06 DIAGNOSIS — N189 Chronic kidney disease, unspecified: Secondary | ICD-10-CM | POA: Diagnosis not present

## 2021-12-06 HISTORY — PX: XI ROBOTIC ASSISTED INGUINAL HERNIA REPAIR WITH MESH: SHX6706

## 2021-12-06 SURGERY — REPAIR, HERNIA, INGUINAL, ROBOT-ASSISTED, LAPAROSCOPIC, USING MESH
Anesthesia: General | Site: Groin | Laterality: Left

## 2021-12-06 MED ORDER — SODIUM CHLORIDE 0.9 % IV SOLN: INTRAVENOUS | Status: DC

## 2021-12-06 MED ORDER — ROCURONIUM BROMIDE 100 MG/10ML IV SOLN
INTRAVENOUS | Status: DC | PRN
  Administered 2021-12-06: 10 mg via INTRAVENOUS
  Administered 2021-12-06: 40 mg via INTRAVENOUS
  Administered 2021-12-06: 10 mg via INTRAVENOUS

## 2021-12-06 MED ORDER — ACETAMINOPHEN 10 MG/ML IV SOLN
INTRAVENOUS | Status: AC
  Filled 2021-12-06: qty 100

## 2021-12-06 MED ORDER — BUPIVACAINE-EPINEPHRINE 0.25% -1:200000 IJ SOLN
INTRAMUSCULAR | Status: DC | PRN
Start: 2021-12-06 — End: 2021-12-06
  Administered 2021-12-06: 30 mL

## 2021-12-06 MED ORDER — CHLORHEXIDINE GLUCONATE 0.12 % MT SOLN
OROMUCOSAL | Status: AC
  Administered 2021-12-06: 15 mL via OROMUCOSAL
  Filled 2021-12-06: qty 15

## 2021-12-06 MED ORDER — PHENYLEPHRINE HCL (PRESSORS) 10 MG/ML IV SOLN
INTRAVENOUS | Status: DC | PRN
  Administered 2021-12-06: 50 ug via INTRAVENOUS

## 2021-12-06 MED ORDER — FAMOTIDINE 20 MG PO TABS
ORAL_TABLET | ORAL | Status: AC
  Administered 2021-12-06: 20 mg via ORAL
  Filled 2021-12-06: qty 1

## 2021-12-06 MED ORDER — ORAL CARE MOUTH RINSE: 15.0000 mL | Freq: Once | OROMUCOSAL | Status: AC

## 2021-12-06 MED ORDER — CEFAZOLIN SODIUM-DEXTROSE 2-4 GM/100ML-% IV SOLN
INTRAVENOUS | Status: AC
  Filled 2021-12-06: qty 100

## 2021-12-06 MED ORDER — CEFAZOLIN SODIUM-DEXTROSE 2-4 GM/100ML-% IV SOLN
2.0000 g | INTRAVENOUS | Status: AC
  Administered 2021-12-06: 2 g via INTRAVENOUS

## 2021-12-06 MED ORDER — ACETAMINOPHEN 10 MG/ML IV SOLN
INTRAVENOUS | Status: DC | PRN
  Administered 2021-12-06: 1000 mg via INTRAVENOUS

## 2021-12-06 MED ORDER — ONDANSETRON HCL 4 MG/2ML IJ SOLN: 4.0000 mg | Freq: Once | INTRAMUSCULAR | Status: DC | PRN

## 2021-12-06 MED ORDER — DEXAMETHASONE SODIUM PHOSPHATE 10 MG/ML IJ SOLN
INTRAMUSCULAR | Status: DC | PRN
  Administered 2021-12-06: 5 mg via INTRAVENOUS

## 2021-12-06 MED ORDER — PROPOFOL 10 MG/ML IV BOLUS
INTRAVENOUS | Status: AC
  Filled 2021-12-06: qty 20

## 2021-12-06 MED ORDER — FENTANYL CITRATE (PF) 100 MCG/2ML IJ SOLN
INTRAMUSCULAR | Status: AC
  Filled 2021-12-06: qty 2

## 2021-12-06 MED ORDER — ONDANSETRON HCL 4 MG/2ML IJ SOLN
INTRAMUSCULAR | Status: DC | PRN
Start: 2021-12-06 — End: 2021-12-06
  Administered 2021-12-06: 4 mg via INTRAVENOUS

## 2021-12-06 MED ORDER — FENTANYL CITRATE (PF) 100 MCG/2ML IJ SOLN: 25.0000 ug | INTRAMUSCULAR | Status: DC | PRN

## 2021-12-06 MED ORDER — TRAMADOL HCL 50 MG PO TABS: 50.0000 mg | ORAL_TABLET | Freq: Four times a day (QID) | ORAL | 0 refills | Status: DC | PRN

## 2021-12-06 MED ORDER — SUCCINYLCHOLINE CHLORIDE 200 MG/10ML IV SOSY
PREFILLED_SYRINGE | INTRAVENOUS | Status: DC | PRN
  Administered 2021-12-06: 100 mg via INTRAVENOUS

## 2021-12-06 MED ORDER — LIDOCAINE HCL (CARDIAC) PF 100 MG/5ML IV SOSY
PREFILLED_SYRINGE | INTRAVENOUS | Status: DC | PRN
  Administered 2021-12-06: 60 mg via INTRAVENOUS

## 2021-12-06 MED ORDER — PROPOFOL 10 MG/ML IV BOLUS
INTRAVENOUS | Status: DC | PRN
  Administered 2021-12-06: 120 mg via INTRAVENOUS

## 2021-12-06 MED ORDER — PHENYLEPHRINE HCL (PRESSORS) 10 MG/ML IV SOLN
INTRAVENOUS | Status: AC
  Filled 2021-12-06: qty 1

## 2021-12-06 MED ORDER — BUPIVACAINE-EPINEPHRINE (PF) 0.25% -1:200000 IJ SOLN
INTRAMUSCULAR | Status: AC
  Filled 2021-12-06: qty 30

## 2021-12-06 MED ORDER — FENTANYL CITRATE (PF) 100 MCG/2ML IJ SOLN
INTRAMUSCULAR | Status: DC | PRN
  Administered 2021-12-06: 50 ug via INTRAVENOUS
  Administered 2021-12-06 (×2): 25 ug via INTRAVENOUS

## 2021-12-06 MED ORDER — SUGAMMADEX SODIUM 200 MG/2ML IV SOLN
INTRAVENOUS | Status: DC | PRN
  Administered 2021-12-06: 200 mg via INTRAVENOUS

## 2021-12-06 MED ORDER — FAMOTIDINE 20 MG PO TABS: 20.0000 mg | ORAL_TABLET | Freq: Once | ORAL | Status: AC

## 2021-12-06 MED ORDER — CHLORHEXIDINE GLUCONATE 0.12 % MT SOLN: 15.0000 mL | Freq: Once | OROMUCOSAL | Status: AC

## 2021-12-06 SURGICAL SUPPLY — 52 items
ADH SKN CLS APL DERMABOND .7 (GAUZE/BANDAGES/DRESSINGS) ×1
BAG INFUSER PRESSURE 100CC (MISCELLANEOUS) IMPLANT
BLADE SURG SZ11 CARB STEEL (BLADE) ×2 IMPLANT
COVER TIP SHEARS 8 DVNC (MISCELLANEOUS) ×1 IMPLANT
COVER TIP SHEARS 8MM DA VINCI (MISCELLANEOUS) ×1
COVER WAND RF STERILE (DRAPES) ×2 IMPLANT
DERMABOND ADVANCED (GAUZE/BANDAGES/DRESSINGS) ×1
DERMABOND ADVANCED .7 DNX12 (GAUZE/BANDAGES/DRESSINGS) ×1 IMPLANT
DRAPE ARM DVNC X/XI (DISPOSABLE) ×3 IMPLANT
DRAPE COLUMN DVNC XI (DISPOSABLE) ×1 IMPLANT
DRAPE DA VINCI XI ARM (DISPOSABLE) ×3
DRAPE DA VINCI XI COLUMN (DISPOSABLE) ×1
ELECT REM PT RETURN 9FT ADLT (ELECTROSURGICAL) ×2
ELECTRODE REM PT RTRN 9FT ADLT (ELECTROSURGICAL) ×1 IMPLANT
GLOVE SURG ENC MOIS LTX SZ6.5 (GLOVE) ×5 IMPLANT
GLOVE SURG UNDER POLY LF SZ6.5 (GLOVE) ×5 IMPLANT
GOWN STRL REUS W/ TWL LRG LVL3 (GOWN DISPOSABLE) ×3 IMPLANT
GOWN STRL REUS W/TWL LRG LVL3 (GOWN DISPOSABLE) ×6
IRRIGATOR SUCT 8 DISP DVNC XI (IRRIGATION / IRRIGATOR) IMPLANT
IRRIGATOR SUCTION 8MM XI DISP (IRRIGATION / IRRIGATOR)
IV CATH ANGIO 12GX3 LT BLUE (NEEDLE) IMPLANT
IV NS 1000ML (IV SOLUTION)
IV NS 1000ML BAXH (IV SOLUTION) IMPLANT
KIT PINK PAD W/HEAD ARE REST (MISCELLANEOUS) ×2
KIT PINK PAD W/HEAD ARM REST (MISCELLANEOUS) ×1 IMPLANT
LABEL OR SOLS (LABEL) IMPLANT
MANIFOLD NEPTUNE II (INSTRUMENTS) ×2 IMPLANT
MESH 3DMAX 4X6 LT LRG (Mesh General) IMPLANT
MESH 3DMAX 4X6 RT LRG (Mesh General) IMPLANT
MESH 3DMAX 5X7 LT XLRG (Mesh General) ×1 IMPLANT
MESH 3DMAX 5X7 RT XLRG (Mesh General) IMPLANT
MESH 3DMAX MID 5X7 LT XLRG (Mesh General) IMPLANT
NDL INSUFFLATION 14GA 120MM (NEEDLE) ×1 IMPLANT
NEEDLE HYPO 22GX1.5 SAFETY (NEEDLE) ×2 IMPLANT
NEEDLE INSUFFLATION 14GA 120MM (NEEDLE) ×2 IMPLANT
OBTURATOR OPTICAL STANDARD 8MM (TROCAR) ×1
OBTURATOR OPTICAL STND 8 DVNC (TROCAR) ×1
OBTURATOR OPTICALSTD 8 DVNC (TROCAR) ×1 IMPLANT
PACK LAP CHOLECYSTECTOMY (MISCELLANEOUS) ×2 IMPLANT
SEAL CANN UNIV 5-8 DVNC XI (MISCELLANEOUS) ×3 IMPLANT
SEAL XI 5MM-8MM UNIVERSAL (MISCELLANEOUS) ×3
SET TUBE SMOKE EVAC HIGH FLOW (TUBING) ×2 IMPLANT
SOLUTION ELECTROLUBE (MISCELLANEOUS) ×2 IMPLANT
SUT MNCRL 4-0 (SUTURE) ×2
SUT MNCRL 4-0 27XMFL (SUTURE) ×1
SUT VIC AB 2-0 SH 27 (SUTURE) ×2
SUT VIC AB 2-0 SH 27XBRD (SUTURE) ×1 IMPLANT
SUT VLOC 90 S/L VL9 GS22 (SUTURE) ×2 IMPLANT
SUTURE MNCRL 4-0 27XMF (SUTURE) ×1 IMPLANT
TAPE TRANSPORE STRL 2 31045 (GAUZE/BANDAGES/DRESSINGS) IMPLANT
TRAY FOLEY MTR SLVR 16FR STAT (SET/KITS/TRAYS/PACK) ×1 IMPLANT
WATER STERILE IRR 500ML POUR (IV SOLUTION) ×1 IMPLANT

## 2021-12-06 NOTE — Interval H&P Note (Signed)
History and Physical Interval Note:  12/06/2021 7:26 AM  Kevin [name]  has presented today for surgery, with the diagnosis of non recurent unilateral inguinal hernia w/o obstruction or gangrene.  The various methods of treatment have been discussed with the patient and family. After consideration of risks, benefits and other options for treatment, the patient has consented to  Procedure(s): XI ROBOTIC [place] (Left) as a surgical intervention.  The patient's history has been reviewed, patient examined, no change in status, stable for surgery.  I have reviewed the patient's chart and labs.  Questions were answered to the patient's satisfaction.     Herbert Pun

## 2021-12-06 NOTE — Anesthesia Procedure Notes (Signed)
Procedure Name: Intubation Date/Time: 12/06/2021 8:38 AM Performed by: Lerry Liner, CRNA Pre-anesthesia Checklist: Patient identified, Emergency Drugs available, Suction available and Patient being monitored Patient Re-evaluated:Patient Re-evaluated prior to induction Oxygen Delivery Method: Circle system utilized Preoxygenation: Pre-oxygenation with 100% oxygen Induction Type: IV induction Ventilation: Mask ventilation without difficulty Laryngoscope Size: McGraph and 4 Grade View: Grade I Tube type: Oral Tube size: 7.5 mm Number of attempts: 1 Airway Equipment and Method: Stylet and Oral airway Placement Confirmation: ETT inserted through vocal cords under direct vision, positive ETCO2 and breath sounds checked- equal and bilateral Secured at: 22 cm Tube secured with: Tape Dental Injury: Teeth and Oropharynx as per pre-operative assessment

## 2021-12-06 NOTE — Discharge Instructions (Addendum)
°  Diet: Resume home heart healthy regular diet.   Activity: No heavy lifting >20 pounds (children, pets, laundry, garbage) or strenuous activity until follow-up, but light activity and walking are encouraged. Do not drive or drink alcohol if taking narcotic pain medications.  Wound care: May shower with soapy water and pat dry (do not rub incisions), but no baths or submerging incision underwater until follow-up. (no swimming)   Medications: Resume all home medications. For mild to moderate pain: acetaminophen (Tylenol) or ibuprofen (if no kidney disease). Combining Tylenol with alcohol can substantially increase your risk of causing liver disease. Narcotic pain medications, if prescribed, can be used for severe pain, though may cause nausea, constipation, and drowsiness. If you do not need the narcotic pain medication, you do not need to fill the prescription.  Call office [phone]) at any time if any questions, worsening pain, fevers/chills, bleeding, drainage from incision site, or other concerns. AMBULATORY SURGERY  DISCHARGE INSTRUCTIONS   The drugs that you were given will stay in your system until tomorrow so for the next 24 hours you should not:  Drive an automobile Make any legal decisions Drink any alcoholic beverage   You may resume regular meals tomorrow.  Today it is better to start with liquids and gradually work up to solid foods.  You may eat anything you prefer, but it is better to start with liquids, then soup and crackers, and gradually work up to solid foods.   Please notify your doctor immediately if you have any unusual bleeding, trouble breathing, redness and pain at the surgery site, drainage, fever, or pain not relieved by medication.      Please contact your physician with any problems or Same Day Surgery at [phone], Monday through Friday 6 am to 4 pm, or [place] at [facility] number at [phone].

## 2021-12-06 NOTE — Transfer of Care (Signed)
Immediate Anesthesia Transfer of Care Note  Patient: Kevin [name]  Procedure(s) Performed: XI ROBOTIC ASSISTED INGUINAL HERNIA REPAIR WITH MESH (Left: Groin)  Patient Location: PACU  Anesthesia Type:General  Level of Consciousness: drowsy  Airway & Oxygen Therapy: Patient Spontanous Breathing and Patient connected to face mask oxygen  Post-op Assessment: Report given to RN  Post vital signs: stable  Last Vitals:  Vitals Value Taken Time  BP 133/62 12/06/21 1012  Temp    Pulse 92 12/06/21 1014  Resp 17 12/06/21 1014  SpO2 100 % 12/06/21 1014  Vitals shown include unvalidated device data.  Last Pain:  Vitals:   12/06/21 0709  TempSrc: Temporal  PainSc: 3          Complications: No notable events documented.

## 2021-12-06 NOTE — Anesthesia Preprocedure Evaluation (Addendum)
Anesthesia Evaluation  Patient identified by MRN, date of birth, ID band Patient awake    Reviewed: Allergy & Precautions, NPO status , Patient's Chart, lab work & pertinent test results  Airway Mallampati: IV  TM Distance: >3 FB Neck ROM: full  Mouth opening: Limited Mouth Opening  Dental  (+) Teeth Intact   Pulmonary neg pulmonary ROS, asthma ,    Pulmonary exam normal  + decreased breath sounds      Cardiovascular Exercise Tolerance: Poor hypertension, Pt. on medications negative cardio ROS Normal cardiovascular exam Rhythm:Regular Rate:Normal     Neuro/Psych Dementia Hx of Parkinson's diseasenegative neurological ROS  negative psych ROS   GI/Hepatic negative GI ROS, Neg liver ROS, GERD  ,  Endo/Other  negative endocrine ROS  Renal/GU Renal disease     Musculoskeletal   Abdominal Normal abdominal exam  (+)   Peds negative pediatric ROS (+)  Hematology negative hematology ROS (+) Blood dyscrasia, anemia ,   Anesthesia Other Findings Past Medical History: No date: Anemia     Comment:  in past No date: Asthma     Comment:  none in years No date: Broken arm     Comment:  Pt was 19 07/2018: Cancer ([place])     Comment:  RENAl, nephrectomy and, 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 No date: Cataract cortical, senile No date: Chronic kidney disease No date: Colon polyps No date: Dementia (HCC)     Comment:  Parkinson's, loss of short term memory 2020: E coli infection     Comment:  blood stream No date: GERD (gastroesophageal reflux disease) No date: HOH (hard of hearing)     Comment:  extremely HOH, wears aids No date: Hyperlipidemia No date: Hypertension No date: Osteoporosis No date: Pancreatitis No date: Pneumonia     Comment:  AS A CHILD  Past Surgical History: No date: BACK SURGERY 05/16/2020: CATARACT EXTRACTION W/PHACO; Left     Comment:  Procedure: CATARACT EXTRACTION PHACO AND INTRAOCULAR               LENS  PLACEMENT (IOC) LEFT 7.27 00:39.0 ;  Surgeon:               Birder Robson, MD;  Location: [place];                Service: Ophthalmology;  Laterality: Left; 06/07/2020: CATARACT EXTRACTION W/PHACO; Right     Comment:  Procedure: CATARACT EXTRACTION PHACO AND INTRAOCULAR               LENS PLACEMENT (IOC) RIGHT 11.56 00:54.9;  Surgeon:               Birder Robson, MD;  Location: [place];                Service: Ophthalmology;  Laterality: Right; 2001, 2006, 2012: COLONOSCOPY 09/11/2000: ESOPHAGOGASTRODUODENOSCOPY 03/02/1997: FLEXIBLE SIGMOIDOSCOPY 11/13/2019: KYPHOPLASTY; N/A     Comment:  Procedure: T12 KYPHOPLASTY;  Surgeon: Hessie Knows, MD;              Location: [facility];  Service: Orthopedics;  Laterality:               N/A; 12/03/2019: KYPHOPLASTY; N/A     Comment:  Procedure: L1 KYPHOPLASTY;  Surgeon: Hessie Knows, MD;               Location: [facility];  Service: Orthopedics;  Laterality:               N/A;  02/28/2021: KYPHOPLASTY; N/A     Comment:  Procedure: T9 & T10 KYPHOPLASTY;  Surgeon: Hessie Knows, MD;  Location: [facility];  Service: Orthopedics;               Laterality: N/A; No date: NASAL POLYP EXCISION 2020: NEPHRECTOMY; Right 11/17/2018: ROBOT ASSISTED LAPAROSCOPIC NEPHRECTOMY; Right     Comment:  Procedure: ROBOTIC ASSISTED LAPAROSCOPIC NEPHRECTOMY;                Surgeon: Hollice Espy, MD;  Location: [facility];                Service: Urology;  Laterality: Right;  BMI    Body Mass Index: 20.90 kg/m      Reproductive/Obstetrics negative OB ROS                             Anesthesia Physical Anesthesia Plan  ASA: 3  Anesthesia Plan: General   Post-op Pain Management:    Induction: Intravenous  PONV Risk Score and Plan: Ondansetron, Dexamethasone, Midazolam and Treatment may vary due to age or medical condition  Airway Management Planned: Oral ETT  Additional Equipment:    Intra-op Plan:   Post-operative Plan: Extubation in OR  Informed Consent: I have reviewed the patients History and Physical, chart, labs and discussed the procedure including the risks, benefits and alternatives for the proposed anesthesia with the patient or authorized representative who has indicated his/her understanding and acceptance.     Dental Advisory Given  Plan Discussed with: CRNA and Surgeon  Anesthesia Plan Comments:         Anesthesia Quick Evaluation

## 2021-12-06 NOTE — Op Note (Signed)
Preoperative diagnosis: Left inguinal hernia.   Postoperative diagnosis: Left inguinal hernia.  Procedure: Robotic assisted Laparoscopic Transabdominal preperitoneal laparoscopic (TAPP) repair of left inguinal hernia.  Anesthesia: GETA  Surgeon: Dr. Windell Moment  Wound Classification: Clean  Indications:  Patient is a 88 y.o. male developed a symptomatic left inguinal hernia. Repair was indicated.  Findings: 1. Left pantaloon Inguinal hernia identified 2. Vas deferens and cord structures identified and preserved 3. Bard Extra Large 3D Max MID Anatomical mesh used for repair 4. Adequate hemostasis.   Description of procedure: The patient was taken to the operating room and the correct side of surgery was verified. The patient was placed supine with arms tucked at the sides. After obtaining adequate anesthesia, the patients abdomen was prepped and draped in standard sterile fashion. The patient was placed in the Trendelenburg position. A time-out was completed verifying correct patient, procedure, site, positioning, and implant(s) and/or special equipment prior to beginning this procedure. A Veress needle was placed at the umbilicus and pneumoperitoneum created with insufflation of carbon dioxide to 15 mmHg. After the Veress needle was removed, an 8-mm trocar was placed on epigastric area and the 30 angled laparoscope inserted. Two 8-mm trocars were then placed lateral to the rectus sheath under direct visualization. Both inguinal regions were inspected and the median umbilical ligament, medial umbilical ligament, and lateral umbilical fold were identified.  The robotic arms were docked. The robotic scope was inserted and the pelvic area anatomy targeted.  The peritoneum was incised with scissors along a line 6 cm above the superior edge of the hernia defect, extending from the median umbilical ligament to the anterior superior iliac spine. The peritoneal flap was mobilized inferiorly using  blunt and sharp dissection. The inferior epigastric vessels were exposed and the pubic symphysis was identified. Coopers ligament was dissected to its junction with the iliac vein. The dissection was continued inferiorly to the iliopubic tract, with care taken to avoid injury to the femoral branch of the genitofemoral nerve and the lateral femoral cutaneous nerve. The cord structures were parietalized. The hernia was identified and reduced by gentle traction.  The hernia sac was noted mobilized from the cord structures and reduced into the peritoneal cavity.  An extra large piece of mesh was rolled longitudinally into a compact cylinder and passed through a trocar. The cylinder was placed along the inferior aspect of the working space and unrolled into place to completely cover the direct, indirect, and femoral spaces. The mesh was secured into place superiorly to the anterior abdominal wall and inferiorly and medially to Coopers ligament with absorbable sutures. Care was taken to avoid the inferolateral triangles containing the iliac vessels and genital nerves. The peritoneal flap was closed over the mesh and secured with suture in similar positions of safety. After ensuring adequate hemostasis, the trocars were removed and the pneumoperitoneum allowed to escape. The trocar incisions were closed using monocryl and skin adhesive dressings applied.  The patient tolerated the procedure well and was taken to the postanesthesia care unit in stable condition.   Specimen: None  Complications: None  Estimated Blood Loss: 5 mL

## 2021-12-06 NOTE — Progress Notes (Signed)
Patients hearing aids and glasses returned

## 2021-12-07 NOTE — Anesthesia Postprocedure Evaluation (Signed)
Anesthesia Post Note  Patient: Kevin [name]  Procedure(s) Performed: XI ROBOTIC ASSISTED INGUINAL HERNIA REPAIR WITH MESH (Left: Groin)  Patient location during evaluation: PACU Anesthesia Type: General Level of consciousness: awake and awake and alert Pain management: satisfactory to patient Vital Signs Assessment: post-procedure vital signs reviewed and stable Respiratory status: spontaneous breathing and respiratory function stable Cardiovascular status: stable Anesthetic complications: no   No notable events documented.   Last Vitals:  Vitals:   12/06/21 1050 12/06/21 1107  BP:  137/74  Pulse: 90 94  Resp: 18 16  Temp: (!) 36.1 C 36.6 C  SpO2: 96% 98%    Last Pain:  Vitals:   12/06/21 1107  TempSrc: Temporal  PainSc: 0-No pain                 VAN STAVEREN,Champayne Kocian

## 2021-12-11 ENCOUNTER — Inpatient Hospital Stay (HOSPITAL_BASED_OUTPATIENT_CLINIC_OR_DEPARTMENT_OTHER): Payer: Medicare HMO | Admitting: Oncology

## 2021-12-11 ENCOUNTER — Encounter: Payer: Self-pay | Admitting: Oncology

## 2021-12-11 ENCOUNTER — Inpatient Hospital Stay: Payer: Medicare HMO

## 2021-12-11 VITALS — BP 113/70 | HR 70 | Temp 97.2°F | Wt 148.9 lb

## 2021-12-11 DIAGNOSIS — D649 Anemia, unspecified: Secondary | ICD-10-CM | POA: Diagnosis not present

## 2021-12-11 DIAGNOSIS — D696 Thrombocytopenia, unspecified: Secondary | ICD-10-CM

## 2021-12-11 DIAGNOSIS — E538 Deficiency of other specified B group vitamins: Secondary | ICD-10-CM | POA: Diagnosis not present

## 2021-12-11 MED ORDER — CYANOCOBALAMIN 1000 MCG/ML IJ SOLN
1000.0000 ug | Freq: Once | INTRAMUSCULAR | Status: AC
  Administered 2021-12-11: 1000 ug via INTRAMUSCULAR
  Filled 2021-12-11: qty 1

## 2021-12-11 NOTE — Progress Notes (Signed)
Hematology/Oncology Progress note [phone] [phone]      Patient Care Team: Baxter Hire, MD as PCP - General (Internal Medicine) Earlie Server, MD as Consulting Physician (Hematology and Oncology)  REFERRING PROVIDER: Denice Paradise CHIEF COMPLAINTS/REASON FOR VISIT:  Follow up for pancytopenia  HISTORY OF PRESENTING ILLNESS:  JESSON FOSKEY is a  88 y.o.  male with PMH listed below who was referred to me for evaluation of kidney mass.  Patient was recently seen by gastroenterology Dr. Vira Agar at the request of patient's primary care physician for consultation for evaluation and management of weight loss, history of colon polyp and a family history of colon cancer. Patient reports that he has lost 20 pounds for the past several years.  May be 4 pounds over the past 6 months. Reports that patient does not eat much and try to eat healthy food. There appears to be some intentional cutting back on calories.  Patient reports he is not as hungry as he used to be. Patient remains very active for his age.  He goes to gym 3 times a week.  Denies any abdominal pain, diarrhea, constipation change of bowel habits.  Denies seeing any blood in the stool. Denies drenching night sweats, fever or chills. Per GI note, last colonoscopy was in 2012 which reviewed benign polyps and internal hemorrhoids, small area of erosion in the cecum biopsy showed chronic active colitis.  Due to patient's age, he is not quite interested to have colonoscopy done and prefers to do noninvasive test first.  So a CT abdomen Pelvis with contrast was obtained.  CT image showed a 6.7 cm mass in the upper pole of the right kidney and a 1.5 cm mass in the lower pole of the left kidney.  Both suspicious for renal cell carcinoma. There is no evidence of metastatic disease within the abdomen or pelvis. Also noted large duodenal diverticulum, mildly enlarged prostate and aortic athero-sclerosis. Patient  denies any flank pain, or hematuria. Family history reviewed. Two sisters diagnosed with breast cancer.   Reviewed patient's previous lab work in [facility].  TSH was checked on 01/31/2017, normal.  PSA 2.05,   # 11/17/2018 Patient underwent right nephrectomy which showed RCC,  clear cell type.  Patient did not follow-up since his surgery.He follows up with Dr. Erlene Quan and has had surveillance images done via urology office. 06/02/2020, CT abdomen pelvis showed no evidence of recurrence or metastatic disease.  Colonic diverticulosis.  Stable liver lesions, better characterize as benign hemangioma on previous MRI liver.  Moderately enlarged prostate.  Aortic atherosclerosis.  # blood work done on 09/19/2020 which showed leukopenia with WBC count of 3.2, anemia with hemoglobin 12.5, Platelet count 1 26,000.  Patient was referred back to me for evaluation of pancytopenia  # 01/23/2021- 01/26/2021 -pancreatitis.  He does not drink alcohol, no gallstone.  Etiology unclear. INTERVAL HISTORY TALLIS SOLEDAD is a 88 y.o. male presents to reestablish care for evaluation of pancytopenia. Problems and complaints are listed below: Patient was accompanied by wife.  He has Parkinson's disease. Appetite is fair.  Patient takes oral vitamin B12 supplementation. Weight is relatively stable comparing to 6 months ago.    . Review of Systems  Constitutional:  Positive for malaise/fatigue and weight loss. Negative for chills and fever.  HENT:  Negative for nosebleeds and sore throat.   Eyes:  Negative for double vision, photophobia and redness.  Respiratory:  Negative for cough, shortness of breath and wheezing.   Cardiovascular:  Negative for chest pain, palpitations and orthopnea.  Gastrointestinal:  Negative for abdominal pain, blood in stool, nausea and vomiting.  Genitourinary:  Negative for dysuria.  Musculoskeletal:  Negative for back pain, myalgias and neck pain.  Skin:  Negative for itching and rash.   Neurological:  Negative for dizziness, tingling and tremors.  Endo/Heme/Allergies:  Negative for environmental allergies. Does not bruise/bleed easily.  Psychiatric/Behavioral:  Negative for depression.    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   Cataract cortical, senile    Chronic kidney disease    Colon polyps    Dementia ([place])    Parkinson's, loss of short term memory   E coli infection 2020   blood stream   GERD (gastroesophageal reflux disease)    HOH (hard of hearing)    extremely HOH, wears aids   Hyperlipidemia    Hypertension    Osteoporosis    Pancreatitis    Pneumonia    AS A CHILD    SURGICAL HISTORY: Past Surgical History:  Procedure Laterality Date   BACK SURGERY     CATARACT EXTRACTION W/PHACO Left 05/16/2020   Procedure: CATARACT EXTRACTION PHACO AND INTRAOCULAR LENS PLACEMENT (IOC) LEFT 7.27 00:39.0 ;  Surgeon: Birder Robson, MD;  Location: [facility];  Service: Ophthalmology;  Laterality: Left;   CATARACT EXTRACTION W/PHACO Right 06/07/2020   Procedure: CATARACT EXTRACTION PHACO AND INTRAOCULAR LENS PLACEMENT (IOC) RIGHT 11.56 00:54.9;  Surgeon: Birder Robson, MD;  Location: [facility];  Service: Ophthalmology;  Laterality: Right;   COLONOSCOPY  2001, 2006, 2012   ESOPHAGOGASTRODUODENOSCOPY  09/11/2000   FLEXIBLE SIGMOIDOSCOPY  03/02/1997   KYPHOPLASTY N/A 11/13/2019   Procedure: T12 KYPHOPLASTY;  Surgeon: Hessie Knows, MD;  Location: [facility];  Service: Orthopedics;  Laterality: N/A;   KYPHOPLASTY N/A 12/03/2019   Procedure: L1 KYPHOPLASTY;  Surgeon: Hessie Knows, MD;  Location: [facility];  Service: Orthopedics;  Laterality: N/A;   KYPHOPLASTY N/A 02/28/2021   Procedure: T9 & T10 KYPHOPLASTY;  Surgeon: Hessie Knows, MD;  Location: [facility];  Service: Orthopedics;   Laterality: N/A;   NASAL POLYP EXCISION     NEPHRECTOMY Right 2020   ROBOT ASSISTED LAPAROSCOPIC NEPHRECTOMY Right 11/17/2018   Procedure: ROBOTIC ASSISTED LAPAROSCOPIC NEPHRECTOMY;  Surgeon: Hollice Espy, MD;  Location: [facility];  Service: Urology;  Laterality: Right;   XI ROBOTIC ASSISTED INGUINAL HERNIA REPAIR WITH MESH Left 12/06/2021   Procedure: XI ROBOTIC ASSISTED INGUINAL HERNIA REPAIR WITH MESH;  Surgeon: Herbert Pun, MD;  Location: [facility];  Service: General;  Laterality: Left;    SOCIAL HISTORY: Social History   Socioeconomic History   Marital status: Married    Spouse name: [name]    Number of children: 1   Years of education: Not on file   Highest education level: Not on file  Occupational History   Occupation: retired    Comment: Network engineer.  Tobacco Use   Smoking status: Never   Smokeless tobacco: Never  Vaping Use   Vaping Use: Never used  Substance and Sexual Activity   Alcohol use: Never   Drug use: Never   Sexual activity: Not on file  Other Topics Concern   Not on file  Social History Narrative   Not on file  Social Determinants of Health   Financial Resource Strain: Not on file  Food Insecurity: Not on file  Transportation Needs: Not on file  Physical Activity: Not on file  Stress: Not on file  Social Connections: Not on file  Intimate Partner Violence: Not on file    FAMILY HISTORY: Family History  Problem Relation Age of Onset   Heart disease Mother    Pancreatitis Mother    Emphysema Father    Breast cancer Sister    Breast cancer Sister     ALLERGIES:  has No Known Allergies.  MEDICATIONS:  Current Outpatient Medications  Medication Sig Dispense Refill   acetaminophen (TYLENOL) 500 MG tablet Take 1,000 mg by mouth every 8 (eight) hours as needed for moderate pain.     albuterol (VENTOLIN HFA) 108 (90 Base) MCG/ACT inhaler Inhale 2 puffs into the lungs every 6 (six) hours as needed for wheezing or shortness of  breath.     alendronate (FOSAMAX) 70 MG tablet Take 70 mg by mouth every Sunday.     amLODipine (NORVASC) 10 MG tablet Take 10 mg by mouth daily.     atorvastatin (LIPITOR) 20 MG tablet Take 20 mg by mouth daily.     carbidopa-levodopa (SINEMET IR) 25-100 MG tablet Take 1-1.5 tablets by mouth See admin instructions. Take 1.5 tablet (0800) by mouth in the morning, then 1.5 tablet (1200) by mouth at noon, then 1.5 tablet by mouth at 5 pm (1700), then 1 tablet by mouth at bedtime (2200), then 1 tablet at 0300.     diclofenac Sodium (VOLTAREN) 1 % GEL Apply 1 application topically 2 (two) times daily as needed (pain).     escitalopram (LEXAPRO) 10 MG tablet Take 10 mg by mouth daily.     Menthol-Methyl Salicylate (BEN GAY GREASELESS) 10-15 % greaseless cream Apply 1 application topically daily as needed for pain.     Multiple Vitamins-Minerals (PRESERVISION AREDS 2 PO) Take 1 tablet by mouth in the morning and at bedtime.     nystatin cream (MYCOSTATIN) Apply 1 application topically 2 (two) times daily as needed for dry skin (on bottom).     rivastigmine (EXELON) 3 MG capsule Take 3 mg by mouth 2 (two) times daily with a meal.     sennosides-docusate sodium (SENOKOT-S) 8.6-50 MG tablet Take 1 tablet by mouth daily as needed for constipation.     traMADol (ULTRAM) 50 MG tablet Take 1 tablet (50 mg total) by mouth every 6 (six) hours as needed. 30 tablet 1   traMADol (ULTRAM) 50 MG tablet Take 1 tablet (50 mg total) by mouth every 6 (six) hours as needed. 20 tablet 0   vitamin B-12 (CYANOCOBALAMIN) 1000 MCG tablet Take 1 tablet (1,000 mcg total) by mouth daily. 90 tablet 1   No current facility-administered medications for this visit.     PHYSICAL EXAMINATION: ECOG PERFORMANCE STATUS: 1 - Symptomatic but completely ambulatory Vitals:   12/11/21 1009  BP: 113/70  Pulse: 70  Temp: (!) 97.2 F (36.2 C)   Filed Weights   12/11/21 1009  Weight: 148 lb 14.4 oz (67.5 kg)    Physical  Exam Constitutional:      General: He is not in acute distress.    Appearance: He is well-developed.     Comments: Frail, walks independently  HENT:     Head: Normocephalic and atraumatic.     Right Ear: External ear normal.     Left Ear: External ear normal.  Eyes:  General: No scleral icterus.    Conjunctiva/sclera: Conjunctivae normal.     Pupils: Pupils are equal, round, and reactive to light.  Cardiovascular:     Rate and Rhythm: Normal rate and regular rhythm.     Heart sounds: Normal heart sounds.  Pulmonary:     Effort: Pulmonary effort is normal. No respiratory distress.     Breath sounds: Normal breath sounds. No wheezing or rales.  Chest:     Chest wall: No tenderness.  Abdominal:     General: Bowel sounds are normal. There is no distension.     Palpations: Abdomen is soft. There is no mass.     Tenderness: There is no abdominal tenderness.  Musculoskeletal:        General: No deformity. Normal range of motion.     Cervical back: Normal range of motion and neck supple.  Lymphadenopathy:     Cervical: No cervical adenopathy.  Skin:    General: Skin is warm and dry.     Findings: No rash.  Neurological:     Mental Status: He is alert. Mental status is at baseline.     Cranial Nerves: No cranial nerve deficit.     Coordination: Coordination normal.     LABORATORY DATA:  I have reviewed the data as listed Lab Results  Component Value Date   WBC 4.2 12/04/2021   HGB 12.2 (L) 12/04/2021   HCT 37.2 (L) 12/04/2021   MCV 92.1 12/04/2021   PLT 148 (L) 12/04/2021   Recent Labs    01/24/21 0501 01/26/21 0604 02/28/21 1343 06/02/21 1124 12/04/21 1131  NA 136 138 133* 136 135  K 4.0 3.2* 4.2 4.0 3.9  CL 107 106 100 100 102  CO2 23 23 24 27 26   GLUCOSE 77 86 96 76 107*  BUN 29* 12 20 29* 24*  CREATININE 0.93 0.73 0.91 0.99 0.93  CALCIUM 7.8* 8.2* 9.0 9.0 8.8*  GFRNONAA >60 >60 >60 >60 >60  PROT 5.4* 5.8*  --  6.6 6.5  ALBUMIN 3.0* 3.1*  --  4.1 4.0   AST 133* 27  --  12* 14*  ALT 55* 11  --  <5 <5  ALKPHOS 98 85  --  61 44  BILITOT 1.0 1.0  --  0.7 0.5  BILIDIR 0.3*  --   --   --   --   IBILI 0.7  --   --   --   --     Iron/TIBC/Ferritin/ %Sat    Component Value Date/Time   FERRITIN 282 03/04/2019 0413        ASSESSMENT & PLAN:  1. Vitamin B12 deficiency   2. Thrombocytopenia ([place])   3. Anemia, unspecified type    # Normocytic anemia,  Hemoglobin 12.2 improved.  #Vitamin B12 supplementation, B12 level has been stable.  Proceed with vitamin B12 IM injection today. Continue oral vitamin B12 supplementation.  #Chronic thrombocytopenia, platelet count is stable.  Monitor. #History of RCC, continue follow-up with urology.  He gets surveillance CT via Dr. Cherrie Gauze office.  Orders Placed This Encounter  Procedures   Comprehensive metabolic panel    Standing Status:   Future    Standing Expiration Date:   12/11/2022   CBC with Differential/Platelet    Standing Status:   Future    Standing Expiration Date:   12/11/2022   Vitamin B12    Standing Status:   Future    Standing Expiration Date:   12/11/2022    All questions  were answered. The patient knows to call the clinic with any problems questions or concerns.  Return of visit: 6 months.  Earlie Server, MD, PhD Hematology Oncology  12/11/2021

## 2021-12-13 ENCOUNTER — Inpatient Hospital Stay: Payer: Medicare HMO

## 2021-12-13 ENCOUNTER — Inpatient Hospital Stay: Payer: Medicare HMO | Admitting: Oncology

## 2022-04-06 ENCOUNTER — Emergency Department
Admission: EM | Admit: 2022-04-06 | Discharge: 2022-04-06 | Disposition: A | Discharge status: Home / Self Care | Payer: Medicare HMO | Source: Home / Self Care

## 2022-04-06 DIAGNOSIS — S0083XA Contusion of other part of head, initial encounter: Secondary | ICD-10-CM | POA: Diagnosis not present

## 2022-04-06 DIAGNOSIS — S0181XA Laceration without foreign body of other part of head, initial encounter: Secondary | ICD-10-CM

## 2022-04-06 DIAGNOSIS — W19XXXA Unspecified fall, initial encounter: Secondary | ICD-10-CM | POA: Diagnosis not present

## 2022-04-06 NOTE — ED Triage Notes (Signed)
Pt presents after hitting his head during a fall this morning. Pt noted to have laceration and is bleeding.

## 2022-04-06 NOTE — ED Provider Notes (Signed)
[facility]    CSN: [id number] Arrival date & time: 04/06/22  1113      History   Chief Complaint Chief Complaint  Patient presents with   Fall    HPI Kevin [name] is a 88 y.o. male.   Patient presents with his wife due to wounds after a fall this morning. The patient was at [employer] and while they were going to check out his foot got caught, causing him to fall. He landed on the floor and they do not think he hit his head on anything. He did not pass out. The patient was wearing his glasses at the time and they think this is what may have caused a laceration above his right eye. She states that he had fallen back in November and his glasses cut him in a similar spot that was glued closed at that time. The patient denies headache, dizziness, or vision changes. He has poor vision in the right eye at baseline due to macular degeneration but denies any worsening. He denies worsening pain with eye movements. The patient was evaluated by EMS at the scene and they recommended he go to urgent care for sutures. The patient has Parkinson's so he is unsteady on his feet at baseline.   The history is provided by the patient and the spouse.  Fall Pertinent negatives include no chest pain, no headaches and no shortness of breath.    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   Cataract cortical, senile    Chronic kidney disease    Colon polyps    Dementia ([place])    Parkinson's, loss of short term memory   E coli infection 2020   blood stream   GERD (gastroesophageal reflux disease)    HOH (hard of hearing)    extremely HOH, wears aids   Hyperlipidemia    Hypertension    Osteoporosis    Pancreatitis    Pneumonia    AS A CHILD    Patient Active Problem List   Diagnosis Date Noted   Acute pancreatitis 01/23/2021   Hypokalemia  01/23/2021   Dementia ([place])    GERD (gastroesophageal reflux disease)    Hypertension    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[place])    Anemia 10/17/2020   Leukopenia 10/17/2020   Thrombocytopenia ([place]) 10/17/2020   CAP (community acquired pneumonia) 03/04/2019   Right renal mass 11/17/2018    Past Surgical History:  Procedure Laterality Date   BACK SURGERY     CATARACT EXTRACTION W/PHACO Left 05/16/2020   Procedure: CATARACT EXTRACTION PHACO AND INTRAOCULAR LENS PLACEMENT (IOC) LEFT 7.27 00:39.0 ;  Surgeon: Birder Robson, MD;  Location: [facility];  Service: Ophthalmology;  Laterality: Left;   CATARACT EXTRACTION W/PHACO Right 06/07/2020   Procedure: CATARACT EXTRACTION PHACO AND INTRAOCULAR LENS PLACEMENT (IOC) RIGHT 11.56 00:54.9;  Surgeon: Birder Robson, MD;  Location: [facility];  Service: Ophthalmology;  Laterality: Right;   COLONOSCOPY  2001, 2006, 2012   ESOPHAGOGASTRODUODENOSCOPY  09/11/2000   FLEXIBLE SIGMOIDOSCOPY  03/02/1997   KYPHOPLASTY N/A 11/13/2019   Procedure: T12 KYPHOPLASTY;  Surgeon: Hessie Knows, MD;  Location: [facility];  Service: Orthopedics;  Laterality: N/A;   KYPHOPLASTY N/A 12/03/2019   Procedure: L1 KYPHOPLASTY;  Surgeon: Hessie Knows, MD;  Location: [facility];  Service: Orthopedics;  Laterality: N/A;   KYPHOPLASTY N/A 02/28/2021   Procedure: T9 & T10 KYPHOPLASTY;  Surgeon: Hessie Knows, MD;  Location: [facility];  Service: Orthopedics;  Laterality: N/A;   NASAL POLYP EXCISION     NEPHRECTOMY Right 2020   ROBOT ASSISTED LAPAROSCOPIC NEPHRECTOMY Right 11/17/2018   Procedure: ROBOTIC ASSISTED LAPAROSCOPIC NEPHRECTOMY;  Surgeon: Hollice Espy, MD;  Location: [facility];  Service: Urology;  Laterality: Right;   XI ROBOTIC ASSISTED INGUINAL HERNIA REPAIR WITH MESH Left 12/06/2021   Procedure: XI ROBOTIC ASSISTED INGUINAL HERNIA REPAIR WITH MESH;  Surgeon: Herbert Pun, MD;  Location: [facility];   Service: General;  Laterality: Left;       Home Medications    Prior to Admission medications   Medication Sig Start Date End Date Taking? Authorizing Provider  acetaminophen (TYLENOL) 500 MG tablet Take 1,000 mg by mouth every 8 (eight) hours as needed for moderate pain.    [provider]  albuterol (VENTOLIN HFA) 108 (90 Base) MCG/ACT inhaler Inhale 2 puffs into the lungs every 6 (six) hours as needed for wheezing or shortness of breath.    [provider]  alendronate (FOSAMAX) 70 MG tablet Take 70 mg by mouth every Sunday.    [provider]  amLODipine (NORVASC) 10 MG tablet Take 10 mg by mouth daily.    [provider]  atorvastatin (LIPITOR) 20 MG tablet Take 20 mg by mouth daily.    [provider]  carbidopa-levodopa (SINEMET IR) 25-100 MG tablet Take 1-1.5 tablets by mouth See admin instructions. Take 1.5 tablet (0800) by mouth in the morning, then 1.5 tablet (1200) by mouth at noon, then 1.5 tablet by mouth at 5 pm (1700), then 1 tablet by mouth at bedtime (2200), then 1 tablet at 0300. 06/15/19   [provider]  diclofenac Sodium (VOLTAREN) 1 % GEL Apply 1 application topically 2 (two) times daily as needed (pain).    [provider]  escitalopram (LEXAPRO) 10 MG tablet Take 10 mg by mouth daily.    [provider]  Menthol-Methyl Salicylate (BEN GAY GREASELESS) 10-15 % greaseless cream Apply 1 application topically daily as needed for pain.    [provider]  Multiple Vitamins-Minerals (PRESERVISION AREDS 2 PO) Take 1 tablet by mouth in the morning and at bedtime.    [provider]  nystatin cream (MYCOSTATIN) Apply 1 application topically 2 (two) times daily as needed for dry skin (on bottom).    [provider]  rivastigmine (EXELON) 3 MG capsule Take 3 mg by mouth 2 (two) times daily with a meal.    [provider]  sennosides-docusate sodium (SENOKOT-S) 8.6-50 MG  tablet Take 1 tablet by mouth daily as needed for constipation.    [provider]  traMADol (ULTRAM) 50 MG tablet Take 1 tablet (50 mg total) by mouth every 6 (six) hours as needed. 02/28/21   Hessie Knows, MD  traMADol (ULTRAM) 50 MG tablet Take 1 tablet (50 mg total) by mouth every 6 (six) hours as needed. 12/06/21 12/06/22  Herbert Pun, MD  vitamin B-12 (CYANOCOBALAMIN) 1000 MCG tablet Take 1 tablet (1,000 mcg total) by mouth daily. 10/17/20   Earlie Server, MD    Family History Family History  Problem Relation Age of Onset   Heart disease Mother    Pancreatitis Mother    Emphysema Father    Breast cancer Sister    Breast cancer Sister  Social History Social History   Tobacco Use   Smoking status: Never   Smokeless tobacco: Never  Vaping Use   Vaping Use: Never used  Substance Use Topics   Alcohol use: Never   Drug use: Never     Allergies   Patient has no known allergies.   Review of Systems Review of Systems  Constitutional:  Negative for fever.  Eyes:  Negative for pain and visual disturbance.  Respiratory:  Negative for shortness of breath.   Cardiovascular:  Negative for chest pain.  Musculoskeletal:  Negative for neck pain.  Skin:  Positive for color change and wound.  Neurological:  Positive for tremors (baseline). Negative for dizziness and headaches.     Physical Exam Triage Vital Signs ED Triage Vitals [04/06/22 1117]  Enc Vitals Group     BP (!) 154/90     Pulse Rate 80     Resp 16     Temp 98.6 F (37 C)     Temp Source Oral     SpO2 98 %     Weight      Height      Head Circumference      Peak Flow      Pain Score 7     Pain Loc      Pain Edu?      Excl. in [place]?    No data found.  Updated Vital Signs BP (!) 154/90 (BP Location: Left Arm)   Pulse 80   Temp 98.6 F (37 C) (Oral)   Resp 16   SpO2 98%   Visual Acuity Right Eye Distance:   Left Eye Distance:   Bilateral Distance:    Right Eye Near:   Left Eye  Near:    Bilateral Near:     Physical Exam Vitals and nursing note reviewed.  Constitutional:      General: He is not in acute distress. HENT:     Head:   Eyes:     Extraocular Movements: Extraocular movements intact.     Conjunctiva/sclera: Conjunctivae normal.     Pupils: Pupils are equal, round, and reactive to light.  Cardiovascular:     Rate and Rhythm: Normal rate and regular rhythm.     Heart sounds: Normal heart sounds.  Pulmonary:     Effort: Pulmonary effort is normal.     Breath sounds: Normal breath sounds.  Musculoskeletal:     Cervical back: Normal range of motion.  Skin:    Comments: 2.5 cm laceration to right eyebrow with brisk bleeding controlled with pressure.  More superficial 1.5cm laceration to right temple with minimal bleeding and adjacent 1cm skin tear to right temple.  2cm skin tear to right proximal forearm.   Neurological:     Mental Status: He is alert.     Gait: Gait abnormal (baseline).  Psychiatric:        Mood and Affect: Mood normal.      UC Treatments / Results  Labs (all labs ordered are listed, but only abnormal results are displayed) Labs Reviewed - No data to display  EKG   Radiology No results found.  Procedures Laceration Repair  Date/Time: 04/06/2022 12:20 PM  Performed by: Delsa Sale, PA Authorized by: Delsa Sale, PA   Consent:    Consent obtained:  Verbal   Consent given by:  Patient and spouse   Risks discussed:  Pain, poor cosmetic result and infection Anesthesia:    Anesthesia method:  Local infiltration  Local anesthetic:  Lidocaine 2% WITH epi Laceration details:    Location:  Face   Face location:  R eyebrow   Length (cm):  2.5   Depth (mm):  5 Pre-procedure details:    Preparation:  Patient was prepped and draped in usual sterile fashion Treatment:    Area cleansed with:  Chlorhexidine   Irrigation solution:  Sterile saline   Irrigation method:  Syringe Skin repair:    Repair method:   Sutures   Suture size:  5-0   Suture material:  Nylon   Suture technique:  Simple interrupted   Number of sutures:  5 Approximation:    Approximation:  Close Repair type:    Repair type:  Simple Post-procedure details:    Dressing:  Adhesive bandage   Procedure completion:  Tolerated well, no immediate complications Laceration Repair  Date/Time: 04/06/2022 12:21 PM  Performed by: Delsa Sale, PA Authorized by: Delsa Sale, PA   Consent:    Consent obtained:  Verbal   Consent given by:  Patient and spouse   Risks discussed:  Infection, pain and poor cosmetic result Anesthesia:    Anesthesia method:  None Laceration details:    Location:  Face   Facial location: R temple.   Length (cm):  1.5   Depth (mm):  1 Pre-procedure details:    Preparation:  Patient was prepped and draped in usual sterile fashion Treatment:    Area cleansed with:  Chlorhexidine Skin repair:    Repair method:  Tissue adhesive Approximation:    Approximation:  Close Repair type:    Repair type:  Simple Post-procedure details:    Dressing:  Adhesive bandage   Procedure completion:  Tolerated well, no immediate complications Wound Care  Date/Time: 04/06/2022 12:22 PM  Performed by: Delsa Sale, PA Authorized by: Delsa Sale, PA   Consent:    Consent obtained:  Verbal   Consent given by:  Patient and spouse Anesthesia:    Anesthesia method:  None Procedure details:    Indications: open wounds     Wound location:  Face   Facial location: R temple.   Wound age (days):  <1   Wound surface area (sq cm):  1 Dressing:    Dressing: adhesive bandage. Post-procedure details:    Procedure completion:  Tolerated well, no immediate complications Wound Care  Date/Time: 04/06/2022 12:24 PM  Performed by: Delsa Sale, PA Authorized by: Delsa Sale, PA   Consent:    Consent obtained:  Verbal   Consent given by:  Patient and spouse Anesthesia:    Anesthesia method:  None Procedure  details:    Wound location:  Arm   Shoulder/arm location:  R lower arm   Wound age (days):  <1   Wound surface area (sq cm):  2 Dressing:    Dressing applied:  Telfa pad   Wrapped with:  [place] 1 inch Post-procedure details:    Procedure completion:  Tolerated well, no immediate complications  (including critical care time)  Medications Ordered in UC Medications - No data to display  Initial Impression / Assessment and Plan / UC Course  I have reviewed the triage vital signs and the nursing notes.  Pertinent labs & imaging results that were available during my care of the patient were reviewed by me and considered in my medical decision making (see chart for details).     Wounds sutured/glued/bandaged. No evidence of LOC or change in neuro status from baseline. Discussed indication  for CT given fall with possible head injury at patient's age - recommended ER for CT and discussed importance of going especially if any neuro changes or headache. Patient's wife expressed understanding. No witnessed direct head injury but fall was not fully witnessed and patient unsure.   E/M: 1 acute complicated injury, no data, moderate risk due to suture placement   Final Clinical Impressions(s) / UC Diagnoses   Final diagnoses:  Fall, initial encounter  Facial laceration, initial encounter  Contusion of other part of head, initial encounter     Discharge Instructions      Main wound sutured today, and another wound was glued. Leave on the bandages for about 24 hours to keep the sutured wound clean and dry. After this, change bandages once or twice a day - you can use large patch Bandaids. May get wet such as showering after the initial 24 hours, but do not soak and dry afterwards. Keep clean, dry, and covered. Return to care in 5 days for suture removal. Return to care sooner if develop increasing redness, pain, swelling, or purulent discharge. Take Tylenol as needed for pain and can apply ice to  help with swelling.     ED Prescriptions   None    PDMP not reviewed this encounter.   Delsa Sale, [place] 04/06/22 1227

## 2022-04-06 NOTE — Discharge Instructions (Signed)
Main wound sutured today, and another wound was glued. Leave on the bandages for about 24 hours to keep the sutured wound clean and dry. After this, change bandages once or twice a day - you can use large patch Bandaids. May get wet such as showering after the initial 24 hours, but do not soak and dry afterwards. Keep clean, dry, and covered. Return to care in 5 days for suture removal. Return to care sooner if develop increasing redness, pain, swelling, or purulent discharge. Take Tylenol as needed for pain and can apply ice to help with swelling.

## 2022-05-01 ENCOUNTER — Other Ambulatory Visit: Payer: Self-pay | Admitting: Physician Assistant

## 2022-05-01 ENCOUNTER — Ambulatory Visit
Admission: RE | Admit: 2022-05-01 | Discharge: 2022-05-01 | Disposition: A | Discharge status: Home / Self Care | Payer: Medicare HMO | Source: Ambulatory Visit | Attending: Physician Assistant | Admitting: Physician Assistant

## 2022-05-01 DIAGNOSIS — S0990XA Unspecified injury of head, initial encounter: Secondary | ICD-10-CM | POA: Diagnosis not present

## 2022-05-01 DIAGNOSIS — I779 Disorder of arteries and arterioles, unspecified: Secondary | ICD-10-CM | POA: Insufficient documentation

## 2022-05-01 DIAGNOSIS — Y92009 Unspecified place in unspecified non-institutional (private) residence as the place of occurrence of the external cause: Secondary | ICD-10-CM

## 2022-05-01 DIAGNOSIS — M549 Dorsalgia, unspecified: Secondary | ICD-10-CM

## 2022-05-01 DIAGNOSIS — W19XXXA Unspecified fall, initial encounter: Secondary | ICD-10-CM | POA: Diagnosis not present

## 2022-05-01 DIAGNOSIS — M6250 Muscle wasting and atrophy, not elsewhere classified, unspecified site: Secondary | ICD-10-CM | POA: Insufficient documentation

## 2022-05-18 ENCOUNTER — Other Ambulatory Visit: Payer: Self-pay | Admitting: Orthopedic Surgery

## 2022-05-18 DIAGNOSIS — S32050A Wedge compression fracture of fifth lumbar vertebra, initial encounter for closed fracture: Secondary | ICD-10-CM

## 2022-05-18 DIAGNOSIS — W19XXXA Unspecified fall, initial encounter: Secondary | ICD-10-CM

## 2022-05-29 ENCOUNTER — Ambulatory Visit
Admission: RE | Admit: 2022-05-29 | Discharge: 2022-05-29 | Disposition: A | Discharge status: Home / Self Care | Payer: Medicare HMO | Source: Ambulatory Visit | Attending: Orthopedic Surgery | Admitting: Orthopedic Surgery

## 2022-05-29 DIAGNOSIS — S32050A Wedge compression fracture of fifth lumbar vertebra, initial encounter for closed fracture: Secondary | ICD-10-CM | POA: Diagnosis present

## 2022-05-29 DIAGNOSIS — W19XXXA Unspecified fall, initial encounter: Secondary | ICD-10-CM | POA: Diagnosis not present

## 2022-05-29 DIAGNOSIS — R937 Abnormal findings on diagnostic imaging of other parts of musculoskeletal system: Secondary | ICD-10-CM | POA: Diagnosis not present

## 2022-05-29 DIAGNOSIS — M47816 Spondylosis without myelopathy or radiculopathy, lumbar region: Secondary | ICD-10-CM | POA: Insufficient documentation

## 2022-06-11 ENCOUNTER — Inpatient Hospital Stay: Payer: Medicare HMO | Attending: Oncology

## 2022-06-11 DIAGNOSIS — I129 Hypertensive chronic kidney disease with stage 1 through stage 4 chronic kidney disease, or unspecified chronic kidney disease: Secondary | ICD-10-CM | POA: Insufficient documentation

## 2022-06-11 DIAGNOSIS — D649 Anemia, unspecified: Secondary | ICD-10-CM | POA: Diagnosis not present

## 2022-06-11 DIAGNOSIS — Z800 Family history of malignant neoplasm of digestive organs: Secondary | ICD-10-CM | POA: Insufficient documentation

## 2022-06-11 DIAGNOSIS — E538 Deficiency of other specified B group vitamins: Secondary | ICD-10-CM | POA: Diagnosis not present

## 2022-06-11 DIAGNOSIS — G20 Parkinson's disease: Secondary | ICD-10-CM | POA: Diagnosis not present

## 2022-06-11 DIAGNOSIS — J90 Pleural effusion, not elsewhere classified: Secondary | ICD-10-CM | POA: Insufficient documentation

## 2022-06-11 DIAGNOSIS — Z85528 Personal history of other malignant neoplasm of kidney: Secondary | ICD-10-CM | POA: Diagnosis not present

## 2022-06-11 DIAGNOSIS — D696 Thrombocytopenia, unspecified: Secondary | ICD-10-CM | POA: Diagnosis not present

## 2022-06-11 DIAGNOSIS — I951 Orthostatic hypotension: Secondary | ICD-10-CM | POA: Diagnosis not present

## 2022-06-11 DIAGNOSIS — N189 Chronic kidney disease, unspecified: Secondary | ICD-10-CM | POA: Diagnosis not present

## 2022-06-11 DIAGNOSIS — Z803 Family history of malignant neoplasm of breast: Secondary | ICD-10-CM | POA: Diagnosis not present

## 2022-06-11 LAB — COMPREHENSIVE METABOLIC PANEL
ALT: <5 U/L (ref 0–44)
AST: 13 U/L — ABNORMAL LOW (ref 15–41)
Albumin: 3.8 g/dL (ref 3.5–5.0)
Alkaline Phosphatase: 79 U/L (ref 38–126)
Anion gap: 7 (ref 5–15)
BUN: 23 mg/dL (ref 8–23)
CO2: 26 mmol/L (ref 22–32)
Calcium: 8.9 mg/dL (ref 8.9–10.3)
Chloride: 105 mmol/L (ref 98–111)
Creatinine, Ser: 1.03 mg/dL (ref 0.61–1.24)
GFR, Estimated: >60 mL/min (ref >60)
Glucose, Bld: 103 mg/dL — ABNORMAL HIGH (ref 70–99)
Potassium: 4.1 mmol/L (ref 3.5–5.1)
Sodium: 138 mmol/L (ref 135–145)
Total Bilirubin: 0.7 mg/dL (ref 0.3–1.2)
Total Protein: 6.4 g/dL — ABNORMAL LOW (ref 6.5–8.1)

## 2022-06-11 LAB — CBC WITH DIFFERENTIAL/PLATELET
Abs Immature Granulocytes: 0.02 10*3/uL (ref 0.00–0.07)
Basophils Absolute: 0 10*3/uL (ref 0.0–0.1)
Basophils Relative: 1 %
Eosinophils Absolute: 0.2 10*3/uL (ref 0.0–0.5)
Eosinophils Relative: 3 %
HCT: 34.9 % — ABNORMAL LOW (ref 39.0–52.0)
Hemoglobin: 11.4 g/dL — ABNORMAL LOW (ref 13.0–17.0)
Immature Granulocytes: 0 %
Lymphocytes Relative: 10 %
Lymphs Abs: 0.7 10*3/uL (ref 0.7–4.0)
MCH: 30.5 pg (ref 26.0–34.0)
MCHC: 32.7 g/dL (ref 30.0–36.0)
MCV: 93.3 fL (ref 80.0–100.0)
Monocytes Absolute: 0.7 10*3/uL (ref 0.1–1.0)
Monocytes Relative: 10 %
Neutro Abs: 4.9 10*3/uL (ref 1.7–7.7)
Neutrophils Relative %: 76 %
Platelets: 164 10*3/uL (ref 150–400)
RBC: 3.74 MIL/uL — ABNORMAL LOW (ref 4.22–5.81)
RDW: 14.5 % (ref 11.5–15.5)
WBC: 6.5 10*3/uL (ref 4.0–10.5)
nRBC: 0 % (ref 0.0–0.2)

## 2022-06-11 LAB — VITAMIN B12: Vitamin B-12: 1219 pg/mL — ABNORMAL HIGH (ref 180–914)

## 2022-06-12 ENCOUNTER — Emergency Department
Admission: RE | Admit: 2022-06-12 | Discharge: 2022-06-12 | Disposition: A | Discharge status: Home / Self Care | Payer: Medicare HMO | Source: Ambulatory Visit | Attending: Urology | Admitting: Urology

## 2022-06-12 ENCOUNTER — Emergency Department
Admission: EM | Admit: 2022-06-12 | Discharge: 2022-06-12 | Disposition: A | Discharge status: Home / Self Care | Payer: Medicare HMO | Attending: Emergency Medicine | Admitting: Emergency Medicine

## 2022-06-12 ENCOUNTER — Emergency Department: Payer: Medicare HMO

## 2022-06-12 ENCOUNTER — Other Ambulatory Visit: Payer: Self-pay

## 2022-06-12 ENCOUNTER — Ambulatory Visit
Admission: RE | Admit: 2022-06-12 | Discharge: 2022-06-12 | Disposition: A | Discharge status: Home / Self Care | Payer: Medicare HMO | Source: Ambulatory Visit | Attending: Urology | Admitting: Urology

## 2022-06-12 DIAGNOSIS — C641 Malignant neoplasm of right kidney, except renal pelvis: Secondary | ICD-10-CM | POA: Insufficient documentation

## 2022-06-12 DIAGNOSIS — E860 Dehydration: Secondary | ICD-10-CM | POA: Insufficient documentation

## 2022-06-12 DIAGNOSIS — I951 Orthostatic hypotension: Secondary | ICD-10-CM | POA: Insufficient documentation

## 2022-06-12 DIAGNOSIS — W19XXXA Unspecified fall, initial encounter: Secondary | ICD-10-CM

## 2022-06-12 DIAGNOSIS — W07XXXA Fall from chair, initial encounter: Secondary | ICD-10-CM | POA: Insufficient documentation

## 2022-06-12 DIAGNOSIS — G20 Parkinson's disease: Secondary | ICD-10-CM | POA: Insufficient documentation

## 2022-06-12 DIAGNOSIS — Z85528 Personal history of other malignant neoplasm of kidney: Secondary | ICD-10-CM | POA: Diagnosis not present

## 2022-06-12 DIAGNOSIS — R42 Dizziness and giddiness: Secondary | ICD-10-CM | POA: Diagnosis present

## 2022-06-12 LAB — COMPREHENSIVE METABOLIC PANEL
ALT: <5 U/L (ref 0–44)
AST: 15 U/L (ref 15–41)
Albumin: 3.9 g/dL (ref 3.5–5.0)
Alkaline Phosphatase: 78 U/L (ref 38–126)
Anion gap: 7 (ref 5–15)
BUN: 23 mg/dL (ref 8–23)
CO2: 23 mmol/L (ref 22–32)
Calcium: 8.5 mg/dL — ABNORMAL LOW (ref 8.9–10.3)
Chloride: 107 mmol/L (ref 98–111)
Creatinine, Ser: 0.97 mg/dL (ref 0.61–1.24)
GFR, Estimated: >60 mL/min (ref >60)
Glucose, Bld: 99 mg/dL (ref 70–99)
Potassium: 4.1 mmol/L (ref 3.5–5.1)
Sodium: 137 mmol/L (ref 135–145)
Total Bilirubin: 0.7 mg/dL (ref 0.3–1.2)
Total Protein: 6.5 g/dL (ref 6.5–8.1)

## 2022-06-12 LAB — CBC WITH DIFFERENTIAL/PLATELET
Abs Immature Granulocytes: 0.04 10*3/uL (ref 0.00–0.07)
Basophils Absolute: 0 10*3/uL (ref 0.0–0.1)
Basophils Relative: 0 %
Eosinophils Absolute: 0.1 10*3/uL (ref 0.0–0.5)
Eosinophils Relative: 2 %
HCT: 36.1 % — ABNORMAL LOW (ref 39.0–52.0)
Hemoglobin: 11.6 g/dL — ABNORMAL LOW (ref 13.0–17.0)
Immature Granulocytes: 1 %
Lymphocytes Relative: 12 %
Lymphs Abs: 0.7 10*3/uL (ref 0.7–4.0)
MCH: 29.8 pg (ref 26.0–34.0)
MCHC: 32.1 g/dL (ref 30.0–36.0)
MCV: 92.8 fL (ref 80.0–100.0)
Monocytes Absolute: 0.8 10*3/uL (ref 0.1–1.0)
Monocytes Relative: 14 %
Neutro Abs: 4.2 10*3/uL (ref 1.7–7.7)
Neutrophils Relative %: 71 %
Platelets: 157 10*3/uL (ref 150–400)
RBC: 3.89 MIL/uL — ABNORMAL LOW (ref 4.22–5.81)
RDW: 14.6 % (ref 11.5–15.5)
WBC: 5.9 10*3/uL (ref 4.0–10.5)
nRBC: 0 % (ref 0.0–0.2)

## 2022-06-12 LAB — TROPONIN I (HIGH SENSITIVITY): Troponin I (High Sensitivity): 9 ng/L (ref <18)

## 2022-06-12 LAB — BLOOD GAS, VENOUS
Acid-Base Excess: 0.8 mmol/L (ref 0.0–2.0)
Bicarbonate: 27.1 mmol/L (ref 20.0–28.0)
O2 Saturation: 43.7 %
Patient temperature: 37
pCO2, Ven: 49 mmHg (ref 44–60)
pH, Ven: 7.35 (ref 7.25–7.43)
pO2, Ven: 31 mmHg — CL (ref 32–45)

## 2022-06-12 LAB — LACTIC ACID, PLASMA: Lactic Acid, Venous: 1.3 mmol/L (ref 0.5–1.9)

## 2022-06-12 LAB — BRAIN NATRIURETIC PEPTIDE: B Natriuretic Peptide: 197.8 pg/mL — ABNORMAL HIGH (ref 0.0–100.0)

## 2022-06-12 MED ORDER — IOHEXOL 300 MG/ML  SOLN
100.0000 mL | Freq: Once | INTRAMUSCULAR | Status: AC | PRN
  Administered 2022-06-12: 100 mL via INTRAVENOUS

## 2022-06-12 NOTE — ED Provider Notes (Signed)
[facility] Provider Note    Event Date/Time   First MD Initiated Contact with Patient 06/12/22 1511     (approximate)   History   Fall (Pt was trying to sit in a chair and fell. 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 On EMS arrival pt was noted to be orthostaticly hypotensive. Pts VS within normal limits in triage despite orthostatic changes. )   HPI  Kevin [name] is a 88 y.o. male  here with fall.  History provided primarily by the patient as well as his wife.  Patient was in his usual state of health today.  He actually went to the hospital this morning for a screening CT for his history of renal cell carcinoma.  He has a known thoracic spine fracture that he has been getting treatment for, but states that it has not been any more painful.  He states that he ate lunch, return back to facility.  There, he reportedly stood up and began to feel lightheaded.  They reportedly checked his blood pressure.  He kind of stumbled and fell, though did not hit anything significantly hard.  Because of his low blood pressure, EMS was called.  On EMS arrival, patient reportedly was orthostatic but otherwise alert and at his mental baseline.  He has been given IV fluids.  States he now feels normal.  Denies any chest pain.  Denies any shortness of breath.  Denies any rash or similar reaction with CT contrast in the past.   Physical Exam   Triage Vital Signs: ED Triage Vitals  Enc Vitals Group     BP 06/12/22 1500 (!) 140/73     Pulse Rate 06/12/22 1500 73     Resp 06/12/22 1500 17     Temp 06/12/22 1500 97.9 F (36.6 C)     Temp Source 06/12/22 1500 Oral     SpO2 06/12/22 1500 98 %     Weight --      Height --      Head Circumference --      Peak Flow --      Pain Score 06/12/22 1513 0     Pain Loc --      Pain Edu? --      Excl. in [place]? --     Most recent vital signs: Vitals:   06/12/22 1500  BP: (!)  140/73  Pulse: 73  Resp: 17  Temp: 97.9 F (36.6 C)  SpO2: 98%     General: Awake, no distress.  CV:  Good peripheral perfusion. RRR. No murmurs. Resp:  Normal effort. Lungs CTAB. Abd:  No distention. No tenderness. Other:  Parkinson's tremor noted. No focal deficits. Mild TTP throughout T,L spine but no marked tenderness, bruising, deformity. Strength 5/5 bl UE and LE. Normal gait with walker.   ED Results / Procedures / Treatments   Labs (all labs ordered are listed, but only abnormal results are displayed) Labs Reviewed  BRAIN NATRIURETIC PEPTIDE - Abnormal; Notable for the following components:      Result Value   B Natriuretic Peptide 197.8 (*)    All other components within normal limits  COMPREHENSIVE METABOLIC PANEL - Abnormal; Notable for the following components:   Calcium 8.5 (*)    All other components within normal limits  CBC WITH DIFFERENTIAL/PLATELET - Abnormal; Notable for the following components:   RBC 3.89 (*)    Hemoglobin 11.6 (*)  HCT 36.1 (*)    All other components within normal limits  BLOOD GAS, VENOUS - Abnormal; Notable for the following components:   pO2, Ven 31 (*)    All other components within normal limits  LACTIC ACID, PLASMA  LACTIC ACID, PLASMA  URINALYSIS, ROUTINE W REFLEX MICROSCOPIC  TROPONIN I (HIGH SENSITIVITY)  TROPONIN I (HIGH SENSITIVITY)     EKG Normal sinus rhythm, ventricular rate 71.  PR 180, QRS 153, QTc 461.  No acute ST elevations or depressions.  No evidence of acute ischemia or infarct.   RADIOLOGY CT head: No acute intracranial abnormality CT spine cervical: Remote superior endplate fracture, otherwise unremarkable   I also independently reviewed and agree with radiologist interpretations.   PROCEDURES:  Critical Care performed: No   MEDICATIONS ORDERED IN ED: Medications - No data to display   IMPRESSION / MDM / [place] / ED COURSE  I reviewed the triage vital signs and the nursing  notes.                              Ddx:  Differential includes the following, with pertinent life- or limb-threatening emergencies considered:  Mechanical fall, transient hypotension in the setting of poor p.o. intake from getting his scan today, orthostatic hypotension, vasovagal syncope, anemia, dehydration, ACS  Patient's presentation is most consistent with acute presentation with potential threat to life or bodily function.  MDM:  88 year old male here with fall, transient hypotension that has now resolved.  Patient was significantly orthostatic and symptoms are completely resolved with IV fluids.  He is now well and denies any complaints.  Lab work shows no acute abnormality.  CBC is without leukocytosis.  CMP is normal.  Lactic acid normal.  Troponin negative.  BNP elevated at 197 but clinically patient has no evidence to suggest CHF.  CT head, C-spine negative.  I reviewed his CT imaging from earlier, which shows his T11 fracture which is known but otherwise no evidence of recurrent malignancy or other abnormality.  Patient is otherwise feeling well.  Is ambulating with his walker here without difficulty.  He would like to go home.  He states that he feels like he just got unsteady because he had been out all day and drank slightly less than usual.  Feel this is reasonable given his otherwise reassuring exam and vital signs here.  Return precautions given.   MEDICATIONS GIVEN IN ED: Medications - No data to display   Consults:    EMR reviewed  Reviewed previous records including notes from [facility] Espy for routine screening of his renal cell carcinoma, visits with Dr. Clayborn Bigness for hypertension     FINAL CLINICAL IMPRESSION(S) / ED DIAGNOSES   Final diagnoses:  Fall, initial encounter  Dehydration     Rx / DC Orders   ED Discharge Orders     None        Note:  This document was prepared using Dragon voice recognition software and may include unintentional  dictation errors.   Duffy Kinney, MD 06/12/22 [phone]

## 2022-06-12 NOTE — Discharge Instructions (Signed)
Make sure you drink plenty of fluids and eat regularly  I'd recommend holding your AMLODIPINE tonight to prevent pressure from going too low.  You can then resume your usual medications.

## 2022-06-18 ENCOUNTER — Encounter: Payer: Self-pay | Admitting: Oncology

## 2022-06-18 ENCOUNTER — Inpatient Hospital Stay: Payer: Medicare HMO | Admitting: Oncology

## 2022-06-18 ENCOUNTER — Inpatient Hospital Stay: Payer: Medicare HMO

## 2022-06-18 VITALS — BP 82/48 | HR 76 | Temp 97.0°F | Resp 18 | Wt 157.8 lb

## 2022-06-18 DIAGNOSIS — D649 Anemia, unspecified: Secondary | ICD-10-CM | POA: Diagnosis not present

## 2022-06-18 DIAGNOSIS — I951 Orthostatic hypotension: Secondary | ICD-10-CM | POA: Diagnosis not present

## 2022-06-18 DIAGNOSIS — E538 Deficiency of other specified B group vitamins: Secondary | ICD-10-CM | POA: Diagnosis not present

## 2022-06-18 DIAGNOSIS — D696 Thrombocytopenia, unspecified: Secondary | ICD-10-CM | POA: Diagnosis not present

## 2022-06-18 MED ORDER — VITAMIN B-12 1000 MCG PO TABS
1000.0000 ug | ORAL_TABLET | ORAL | 0 refills | Status: AC
Start: 2022-06-18 — End: ?

## 2022-06-18 NOTE — Progress Notes (Signed)
Patient here for follow up. Pt's bp is low, per wife his BP stays low.

## 2022-06-18 NOTE — Progress Notes (Signed)
Hematology/Oncology Progress note [phone] [phone]      Patient Care Team: Baxter Hire, MD as PCP - General (Internal Medicine) Earlie Server, MD as Consulting Physician (Hematology and Oncology)  REFERRING PROVIDER: Denice Paradise CHIEF COMPLAINTS/REASON FOR VISIT:  Follow up for history of RCC, history of B12 deficiency, thrombocytopenia, anemia.  HISTORY OF PRESENTING ILLNESS:  Kevin [name] is a  88 y.o.  male with presents for follow-up of history of RCC, history of B12 deficiency, thrombocytopenia, anemia Patient was recently seen by gastroenterology Dr. Vira Agar at the request of patient's primary care physician for consultation for evaluation and management of weight loss, history of colon polyp and a family history of colon cancer. Per GI note, last colonoscopy was in 2012 which reviewed benign polyps and internal hemorrhoids, small area of erosion in the cecum biopsy showed chronic active colitis.  Due to patient's age, he is not quite interested to have colonoscopy done and prefers to do noninvasive test first.  So a CT abdomen Pelvis with contrast was obtained.  CT image showed a 6.7 cm mass in the upper pole of the right kidney and a 1.5 cm mass in the lower pole of the left kidney.  Both suspicious for renal cell carcinoma. There is no evidence of metastatic disease within the abdomen or pelvis. MRI have indicated that left kidney mass is a Bosniak category 2 complex but benign cyst   Family history reviewed. Two sisters diagnosed with breast cancer.   # 11/17/2018 Patient underwent right nephrectomy which showed RCC,  clear cell type.  Patient did not follow-up since his surgery.He follows up with Dr. Erlene Quan and has had surveillance images done via urology office.  06/02/2020, CT abdomen pelvis showed no evidence of recurrence or metastatic disease.  Colonic diverticulosis.  Stable liver lesions, better characterize as benign hemangioma on  previous MRI liver.  Moderately enlarged prostate.  Aortic atherosclerosis.  # 01/23/2021- 01/26/2021 -pancreatitis.  He does not drink alcohol, no gallstone.  Etiology unclear.  INTERVAL HISTORY Kevin [name] is a 88 y.o. male presents to reestablish care for history of RCC, history of B12 deficiency, thrombocytopenia, anemia Problems and complaints are listed below: Patient was accompanied by wife.  He has Parkinson's disease. Recently patient has had low blood pressure episodes.  06/12/2022, patient had an ED visit due to fall and hitting his head.  Upon EMS arrival, patient was noted to be orthostatically hypotensive.  Patient was given IV fluid in the ED.  Orthostatic symptoms improved after IV fluids. Has an upcoming urology appointment. 05/23/2022, CT abdomen pelvis with contrast showed  1 Acute fracture of T11 involving the anterior middle column, with mild distraction of the superior endplate and paraspinal edema. 2. No findings of recurrent malignancy. 3. Remote compression fractures and vertebral augmentations in the thoracic and lumbar spine, with suspected impingement at T9-10, L3-4, L4-5, and L5-S1. 4. Trace bilateral pleural effusions. These are new compared to the prior exam.  Appetite is fair.  Patient takes oral vitamin B12 supplementation. Patient has gained weight.  Appetite is fair.  . Review of Systems  Constitutional:  Positive for malaise/fatigue and weight loss. Negative for chills and fever.  HENT:  Negative for nosebleeds and sore throat.   Eyes:  Negative for double vision, photophobia and redness.  Respiratory:  Negative for cough, shortness of breath and wheezing.   Cardiovascular:  Negative for chest pain, palpitations and orthopnea.  Gastrointestinal:  Negative for abdominal pain, blood in stool, nausea  and vomiting.  Genitourinary:  Negative for dysuria.  Musculoskeletal:  Negative for back pain, myalgias and neck pain.  Skin:  Negative for itching and rash.   Neurological:  Negative for dizziness, tingling and tremors.  Endo/Heme/Allergies:  Negative for environmental allergies. Does not bruise/bleed easily.  Psychiatric/Behavioral:  Negative for depression.     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   Cataract cortical, senile    Chronic kidney disease    Colon polyps    Dementia ([place])    Parkinson's, loss of short term memory   E coli infection 2020   blood stream   GERD (gastroesophageal reflux disease)    HOH (hard of hearing)    extremely HOH, wears aids   Hyperlipidemia    Hypertension    Osteoporosis    Pancreatitis    Pneumonia    AS A CHILD    SURGICAL HISTORY: Past Surgical History:  Procedure Laterality Date   BACK SURGERY     CATARACT EXTRACTION W/PHACO Left 05/16/2020   Procedure: CATARACT EXTRACTION PHACO AND INTRAOCULAR LENS PLACEMENT (IOC) LEFT 7.27 00:39.0 ;  Surgeon: Birder Robson, MD;  Location: [facility];  Service: Ophthalmology;  Laterality: Left;   CATARACT EXTRACTION W/PHACO Right 06/07/2020   Procedure: CATARACT EXTRACTION PHACO AND INTRAOCULAR LENS PLACEMENT (IOC) RIGHT 11.56 00:54.9;  Surgeon: Birder Robson, MD;  Location: [facility];  Service: Ophthalmology;  Laterality: Right;   COLONOSCOPY  2001, 2006, 2012   ESOPHAGOGASTRODUODENOSCOPY  09/11/2000   FLEXIBLE SIGMOIDOSCOPY  03/02/1997   KYPHOPLASTY N/A 11/13/2019   Procedure: T12 KYPHOPLASTY;  Surgeon: Hessie Knows, MD;  Location: [facility];  Service: Orthopedics;  Laterality: N/A;   KYPHOPLASTY N/A 12/03/2019   Procedure: L1 KYPHOPLASTY;  Surgeon: Hessie Knows, MD;  Location: [facility];  Service: Orthopedics;  Laterality: N/A;   KYPHOPLASTY N/A 02/28/2021   Procedure: T9 & T10 KYPHOPLASTY;  Surgeon: Hessie Knows, MD;  Location: [facility];  Service:  Orthopedics;  Laterality: N/A;   NASAL POLYP EXCISION     NEPHRECTOMY Right 2020   ROBOT ASSISTED LAPAROSCOPIC NEPHRECTOMY Right 11/17/2018   Procedure: ROBOTIC ASSISTED LAPAROSCOPIC NEPHRECTOMY;  Surgeon: Hollice Espy, MD;  Location: [facility];  Service: Urology;  Laterality: Right;   XI ROBOTIC ASSISTED INGUINAL HERNIA REPAIR WITH MESH Left 12/06/2021   Procedure: XI ROBOTIC ASSISTED INGUINAL HERNIA REPAIR WITH MESH;  Surgeon: Herbert Pun, MD;  Location: [facility];  Service: General;  Laterality: Left;    SOCIAL HISTORY: Social History   Socioeconomic History   Marital status: Married    Spouse name: [name]    Number of children: 1   Years of education: Not on file   Highest education level: Not on file  Occupational History   Occupation: retired    Comment: Network engineer.  Tobacco Use   Smoking status: Never   Smokeless tobacco: Never  Vaping Use   Vaping Use: Never used  Substance and Sexual Activity   Alcohol use: Never   Drug use: Never   Sexual activity: Not on file  Other Topics Concern   Not on file  Social History Narrative   Not on file   Social Determinants of Health   Financial Resource Strain: Low Risk  (07/22/2018)   Overall  Financial Resource Strain (CARDIA)    Difficulty of Paying Living Expenses: Not very hard  Food Insecurity: No Food Insecurity (07/22/2018)   Hunger Vital Sign    Worried About Running Out of Food in the Last Year: Never true    Ran Out of Food in the Last Year: Never true  Transportation Needs: No Transportation Needs (07/22/2018)   PRAPARE - Hydrologist (Medical): No    Lack of Transportation (Non-Medical): No  Physical Activity: Sufficiently Active (07/22/2018)   Exercise Vital Sign    Days of Exercise per Week: 3 days    Minutes of Exercise per Session: 60 min  Stress: Stress Concern Present (07/22/2018)   [place]     Feeling of Stress : To some extent  Social Connections: Unknown (07/22/2018)   Social Connection and Isolation Panel [NHANES]    Frequency of Communication with Friends and Family: Once a week    Frequency of Social Gatherings with Friends and Family: Once a week    Attends Religious Services: More than 4 times per year    Active Member of [employer] or Organizations: Not on file    Attends Archivist Meetings: Not on file    Marital Status: Married  Intimate Partner Violence: Not At Risk (07/22/2018)   Humiliation, Afraid, Rape, and Kick questionnaire    Fear of Current or Ex-Partner: No    Emotionally Abused: No    Physically Abused: No    Sexually Abused: No    FAMILY HISTORY: Family History  Problem Relation Age of Onset   Heart disease Mother    Pancreatitis Mother    Emphysema Father    Breast cancer Sister    Breast cancer Sister     ALLERGIES:  has No Known Allergies.  MEDICATIONS:  Current Outpatient Medications  Medication Sig Dispense Refill   acetaminophen (TYLENOL) 500 MG tablet Take 1,000 mg by mouth every 8 (eight) hours as needed for moderate pain.     albuterol (VENTOLIN HFA) 108 (90 Base) MCG/ACT inhaler Inhale 2 puffs into the lungs every 6 (six) hours as needed for wheezing or shortness of breath.     alendronate (FOSAMAX) 70 MG tablet Take 70 mg by mouth every Sunday.     amLODipine (NORVASC) 10 MG tablet Take 10 mg by mouth daily.     atorvastatin (LIPITOR) 20 MG tablet Take 20 mg by mouth daily.     carbidopa-levodopa (SINEMET IR) 25-100 MG tablet Take 1-1.5 tablets by mouth See admin instructions. Take 1.5 tablet (0800) by mouth in the morning, then 1.5 tablet (1200) by mouth at noon, then 1.5 tablet by mouth at 5 pm (1700), then 1 tablet by mouth at bedtime (2200), then 1 tablet at 0300.     diclofenac Sodium (VOLTAREN) 1 % GEL Apply 1 application topically 2 (two) times daily as needed (pain).     escitalopram (LEXAPRO) 10 MG tablet Take 10 mg by  mouth daily.     Menthol-Methyl Salicylate (BEN GAY GREASELESS) 10-15 % greaseless cream Apply 1 application topically daily as needed for pain.     Multiple Vitamins-Minerals (PRESERVISION AREDS 2 PO) Take 1 tablet by mouth in the morning and at bedtime.     nystatin cream (MYCOSTATIN) Apply 1 application topically 2 (two) times daily as needed for dry skin (on bottom).     rivastigmine (EXELON) 3 MG capsule Take 3 mg by mouth 2 (two) times  daily with a meal.     sennosides-docusate sodium (SENOKOT-S) 8.6-50 MG tablet Take 1 tablet by mouth daily as needed for constipation.     cyanocobalamin (VITAMIN B12) 1000 MCG tablet Take 1 tablet (1,000 mcg total) by mouth 2 (two) times a week. 90 tablet 0   traMADol (ULTRAM) 50 MG tablet Take 1 tablet (50 mg total) by mouth every 6 (six) hours as needed. (Patient not taking: Reported on 06/18/2022) 20 tablet 0   No current facility-administered medications for this visit.     PHYSICAL EXAMINATION: ECOG PERFORMANCE STATUS: 2 - Symptomatic, <50% confined to bed Vitals:   06/18/22 0927  BP: (!) 82/48  Pulse: 76  Resp: 18  Temp: (!) 97 F (36.1 C)   Filed Weights   06/18/22 0927  Weight: 157 lb 12.8 oz (71.6 kg)    Physical Exam Constitutional:      General: He is not in acute distress.    Appearance: He is well-developed.     Comments: Patient sits in the wheelchair  HENT:     Head: Normocephalic and atraumatic.     Right Ear: External ear normal.     Left Ear: External ear normal.  Eyes:     General: No scleral icterus. Cardiovascular:     Rate and Rhythm: Normal rate and regular rhythm.     Heart sounds: Normal heart sounds.  Pulmonary:     Effort: Pulmonary effort is normal. No respiratory distress.     Breath sounds: Normal breath sounds. No wheezing or rales.  Chest:     Chest wall: No tenderness.  Abdominal:     General: There is no distension.     Palpations: Abdomen is soft.  Musculoskeletal:        General: No  deformity. Normal range of motion.     Cervical back: Normal range of motion and neck supple.  Lymphadenopathy:     Cervical: No cervical adenopathy.  Skin:    General: Skin is warm and dry.  Neurological:     Mental Status: He is alert. Mental status is at baseline.  Psychiatric:        Mood and Affect: Mood normal.      LABORATORY DATA:  I have reviewed the data as listed Lab Results  Component Value Date   WBC 5.9 06/12/2022   HGB 11.6 (L) 06/12/2022   HCT 36.1 (L) 06/12/2022   MCV 92.8 06/12/2022   PLT 157 06/12/2022   Recent Labs    12/04/21 1131 06/11/22 1013 06/12/22 1545  NA 135 138 137  K 3.9 4.1 4.1  CL 102 105 107  CO2 '26 26 23  '$ GLUCOSE 107* 103* 99  BUN 24* 23 23  CREATININE 0.93 1.03 0.97  CALCIUM 8.8* 8.9 8.5*  GFRNONAA >60 >60 >60  PROT 6.5 6.4* 6.5  ALBUMIN 4.0 3.8 3.9  AST 14* 13* 15  ALT <5 <5 <5  ALKPHOS 44 79 78  BILITOT 0.5 0.7 0.7    Iron/TIBC/Ferritin/ %Sat    Component Value Date/Time   FERRITIN 282 03/04/2019 0413        ASSESSMENT & PLAN:  1. Vitamin B12 deficiency   2. Thrombocytopenia ([place])   3. Anemia, unspecified type   4. Orthostatic hypotension    # Normocytic anemia,  Hemoglobin 11.6, normocytic.  Close to baseline.  Continue monitor.  #Vitamin B12 supplementation, B12 level is elevated.  No need for vitamin B12 injection today. Recommend patient to decrease vitamin B12 oral supplementation  to 2-3 times per week.  #Chronic thrombocytopenia, platelet count has normalized today. #History of RCC, continue follow-up with urology.  He gets surveillance CT via Dr. Cherrie Gauze office. Most recent CT showed no cancer progression.  # Hypotension, ?  Secondary to blood pressure medication, or neurogenic hypotension due to Parkinson's disease  encourage oral hydration.  Recommend patient to hold off amlodipine 10 mg and further discuss with primary care provider for dose reduction.  Orders Placed This Encounter   Procedures   CBC with Differential/Platelet    Standing Status:   Future    Standing Expiration Date:   06/19/2023   Comprehensive metabolic panel    Standing Status:   Future    Standing Expiration Date:   06/18/2023   Vitamin B12    Standing Status:   Future    Standing Expiration Date:   06/19/2023    All questions were answered. The patient knows to call the clinic with any problems questions or concerns.  Return of visit: 6 months.  Earlie Server, MD, PhD Hematology Oncology  06/18/2022

## 2022-06-19 ENCOUNTER — Ambulatory Visit: Payer: Medicare HMO | Admitting: Urology

## 2022-06-19 ENCOUNTER — Encounter: Payer: Self-pay | Admitting: Urology

## 2022-06-19 VITALS — BP 106/78 | HR 68 | Ht 72.0 in | Wt 157.0 lb

## 2022-06-19 DIAGNOSIS — Z85528 Personal history of other malignant neoplasm of kidney: Secondary | ICD-10-CM | POA: Diagnosis not present

## 2022-06-19 DIAGNOSIS — C641 Malignant neoplasm of right kidney, except renal pelvis: Secondary | ICD-10-CM

## 2022-06-19 DIAGNOSIS — R351 Nocturia: Secondary | ICD-10-CM | POA: Diagnosis not present

## 2022-06-19 NOTE — Progress Notes (Signed)
06/19/2022 3:07 PM   Kevin [name] 23-Nov-1932 [id number]  Referring provider: Baxter Hire, MD [place],  [place] [address]  Chief Complaint  Patient presents with   Renal cancer follow up    HPI: 88 year old male with personal history of renal cell carcinoma who returns today for follow-up.  He underwent robot-assisted laparoscopic nephrectomy in 10/2018.  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 Pt1b, Nx.   He underwent CT abdomen pelvis on 06/12/2022 which shows multiple known vertebral issues including an acute fracture of T11 which is being worked up/treated.  Other than this, no evidence of recurrent disease.  Chest x-ray is negative.  He mentions today that he gets up a lot to urinate.  During the daytime, he can sit in his chair for 5 hours of having to urinate but at nighttime, he gets up at least 4-5 times.  This is sometimes bothersome to him.  He does have lower extremity edema has been told to keep his feet up during the day and wear compression hose.  He sometimes snores.  He does have a personal history of Parkinson's disease.  He has had multiple recent falls.  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   Cataract cortical, senile    Chronic kidney disease    Colon polyps    Dementia ([place])    Parkinson's, loss of short term memory   E coli infection 2020   blood stream   GERD (gastroesophageal reflux disease)    HOH (hard of hearing)    extremely HOH, wears aids   Hyperlipidemia    Hypertension    Osteoporosis    Pancreatitis    Pneumonia    AS A CHILD    Surgical History: Past Surgical History:  Procedure Laterality Date   BACK SURGERY     CATARACT EXTRACTION W/PHACO Left 05/16/2020   Procedure:  CATARACT EXTRACTION PHACO AND INTRAOCULAR LENS PLACEMENT (IOC) LEFT 7.27 00:39.0 ;  Surgeon: Birder Robson, MD;  Location: [facility];  Service: Ophthalmology;  Laterality: Left;   CATARACT EXTRACTION W/PHACO Right 06/07/2020   Procedure: CATARACT EXTRACTION PHACO AND INTRAOCULAR LENS PLACEMENT (IOC) RIGHT 11.56 00:54.9;  Surgeon: Birder Robson, MD;  Location: [facility];  Service: Ophthalmology;  Laterality: Right;   COLONOSCOPY  2001, 2006, 2012   ESOPHAGOGASTRODUODENOSCOPY  09/11/2000   FLEXIBLE SIGMOIDOSCOPY  03/02/1997   KYPHOPLASTY N/A 11/13/2019   Procedure: T12 KYPHOPLASTY;  Surgeon: Hessie Knows, MD;  Location: [facility];  Service: Orthopedics;  Laterality: N/A;   KYPHOPLASTY N/A 12/03/2019   Procedure: L1 KYPHOPLASTY;  Surgeon: Hessie Knows, MD;  Location: [facility];  Service: Orthopedics;  Laterality: N/A;   KYPHOPLASTY N/A 02/28/2021   Procedure: T9 & T10 KYPHOPLASTY;  Surgeon: Hessie Knows, MD;  Location: [facility];  Service: Orthopedics;  Laterality: N/A;   NASAL POLYP EXCISION     NEPHRECTOMY Right 2020   ROBOT ASSISTED LAPAROSCOPIC NEPHRECTOMY Right 11/17/2018   Procedure: ROBOTIC ASSISTED LAPAROSCOPIC NEPHRECTOMY;  Surgeon: Hollice Espy, MD;  Location: [facility];  Service: Urology;  Laterality: Right;   XI ROBOTIC ASSISTED INGUINAL HERNIA REPAIR WITH MESH Left 12/06/2021   Procedure: XI ROBOTIC ASSISTED INGUINAL HERNIA  REPAIR WITH MESH;  Surgeon: Herbert Pun, MD;  Location: [facility];  Service: General;  Laterality: Left;    Home Medications:  Allergies as of 06/19/2022   No Known Allergies      Medication List        Accurate as of June 19, 2022  3:07 PM. If you have any questions, ask your nurse or doctor.          STOP taking these medications    sennosides-docusate sodium 8.6-50 MG tablet Commonly known as: SENOKOT-S Stopped by: Hollice Espy, MD       TAKE these medications    acetaminophen 500 MG  tablet Commonly known as: TYLENOL Take 1,000 mg by mouth every 8 (eight) hours as needed for moderate pain.   albuterol 108 (90 Base) MCG/ACT inhaler Commonly known as: VENTOLIN HFA Inhale 2 puffs into the lungs every 6 (six) hours as needed for wheezing or shortness of breath.   alendronate 70 MG tablet Commonly known as: FOSAMAX Take 70 mg by mouth every Sunday.   amLODipine 10 MG tablet Commonly known as: NORVASC Take 10 mg by mouth daily.   atorvastatin 20 MG tablet Commonly known as: LIPITOR Take 20 mg by mouth daily.   BEN GAY GREASELESS 10-15 % greaseless cream Apply 1 application topically daily as needed for pain.   carbidopa-levodopa 25-100 MG tablet Commonly known as: SINEMET IR Take 1-1.5 tablets by mouth See admin instructions. Take 1.5 tablet (0800) by mouth in the morning, then 1.5 tablet (1200) by mouth at noon, then 1.5 tablet by mouth at 5 pm (1700), then 1 tablet by mouth at bedtime (2200), then 1 tablet at 0300.   cyanocobalamin 1000 MCG tablet Commonly known as: VITAMIN B12 Take 1 tablet (1,000 mcg total) by mouth 2 (two) times a week.   diclofenac Sodium 1 % Gel Commonly known as: VOLTAREN Apply 1 application topically 2 (two) times daily as needed (pain).   escitalopram 10 MG tablet Commonly known as: LEXAPRO Take 10 mg by mouth daily.   nystatin cream Commonly known as: MYCOSTATIN Apply 1 application topically 2 (two) times daily as needed for dry skin (on bottom).   PRESERVISION AREDS 2 PO Take 1 tablet by mouth in the morning and at bedtime.   rivastigmine 3 MG capsule Commonly known as: EXELON Take 3 mg by mouth 2 (two) times daily with a meal.   traMADol 50 MG tablet Commonly known as: Ultram Take 1 tablet (50 mg total) by mouth every 6 (six) hours as needed.        Allergies: No Known Allergies  Family History: Family History  Problem Relation Age of Onset   Heart disease Mother    Pancreatitis Mother    Emphysema Father     Breast cancer Sister    Breast cancer Sister     Social History:  reports that he has never smoked. He has never used smokeless tobacco. He reports that he does not drink alcohol and does not use drugs.   Physical Exam: BP 106/78   Pulse 68   Ht 6' (1.829 m)   Wt 157 lb (71.2 kg)   BMI 21.29 kg/m   Constitutional:  Alert and oriented, No acute distress.  In wheelchair accompanied by his wife today. HEENT: [place] AT, moist mucus membranes.  Trachea midline, no masses. Cardiovascular: No clubbing, cyanosis, or edema. Respiratory: Normal respiratory effort, no increased work of breathing. Skin: No rashes, bruises or suspicious lesions. Neurologic: Grossly intact, no  focal deficits, moving all 4 extremities. Psychiatric: Normal mood and affect.  Laboratory Data: Lab Results  Component Value Date   WBC 5.9 06/12/2022   HGB 11.6 (L) 06/12/2022   HCT 36.1 (L) 06/12/2022   MCV 92.8 06/12/2022   PLT 157 06/12/2022    Lab Results  Component Value Date   CREATININE 0.97 06/12/2022   Urinalysis Pertinent Imaging: CT abdomen pelvis and chest x-ray reviewed.  The images themselves were personally reviewed and agree with the radiology interpretation.  Assessment & Plan:    1. Carcinoma, renal cell, right ([place]) Status post nephrectomy 2020  Status post 3 years of surveillance, no evidence of recurrence at this point, imaging today was reassuring and not urologic in nature  At this point, no further indication for further surveillance.  He and his wife are pleased with this.  2. Nocturia We discussed the pathophysiology of nocturia and aging.  Some of this may be behavioral related, fluid redistribution, hormonal changes related to aging, underlying BPH as well as his Parkinson's disease.  They are adamantly against medications especially in light of his falls and dizziness.  He will cut back on fluids 4 hours before bedtime and start elevating his feet during the daytime wear  compression stockings.  If this fails to improve his symptoms, he can return to discuss other options.   As needed  Hollice Espy, MD  [facility] [address], [place], [place] [address] [phone]  I spent 31 total minutes on the day of the encounter including pre-visit review of the medical record, face-to-face time with the patient, and post visit ordering of labs/imaging/tests.

## 2022-06-24 ENCOUNTER — Observation Stay (HOSPITAL_COMMUNITY)
Admit: 2022-06-24 | Discharge: 2022-06-24 | Disposition: A | Discharge status: Home / Self Care | Payer: Medicare HMO | Attending: Internal Medicine | Admitting: Internal Medicine

## 2022-06-24 ENCOUNTER — Inpatient Hospital Stay
Admission: EM | Admit: 2022-06-24 | Discharge: 2022-06-29 | DRG: 065 | Disposition: A | Discharge status: Skilled Nursing Facility | Payer: Medicare HMO | Attending: Internal Medicine | Admitting: Internal Medicine

## 2022-06-24 ENCOUNTER — Other Ambulatory Visit: Payer: Self-pay

## 2022-06-24 ENCOUNTER — Emergency Department: Payer: Medicare HMO

## 2022-06-24 ENCOUNTER — Observation Stay: Payer: Medicare HMO

## 2022-06-24 DIAGNOSIS — R34 Anuria and oliguria: Secondary | ICD-10-CM | POA: Diagnosis not present

## 2022-06-24 DIAGNOSIS — E876 Hypokalemia: Secondary | ICD-10-CM | POA: Diagnosis present

## 2022-06-24 DIAGNOSIS — I639 Cerebral infarction, unspecified: Secondary | ICD-10-CM | POA: Diagnosis present

## 2022-06-24 DIAGNOSIS — I6389 Other cerebral infarction: Secondary | ICD-10-CM | POA: Diagnosis not present

## 2022-06-24 DIAGNOSIS — R339 Retention of urine, unspecified: Secondary | ICD-10-CM | POA: Diagnosis present

## 2022-06-24 DIAGNOSIS — R29898 Other symptoms and signs involving the musculoskeletal system: Secondary | ICD-10-CM

## 2022-06-24 DIAGNOSIS — Z85828 Personal history of other malignant neoplasm of skin: Secondary | ICD-10-CM

## 2022-06-24 DIAGNOSIS — G459 Transient cerebral ischemic attack, unspecified: Secondary | ICD-10-CM | POA: Diagnosis not present

## 2022-06-24 DIAGNOSIS — Z85528 Personal history of other malignant neoplasm of kidney: Secondary | ICD-10-CM

## 2022-06-24 DIAGNOSIS — I6381 Other cerebral infarction due to occlusion or stenosis of small artery: Secondary | ICD-10-CM | POA: Diagnosis not present

## 2022-06-24 DIAGNOSIS — K219 Gastro-esophageal reflux disease without esophagitis: Secondary | ICD-10-CM | POA: Diagnosis present

## 2022-06-24 DIAGNOSIS — H9190 Unspecified hearing loss, unspecified ear: Secondary | ICD-10-CM | POA: Diagnosis present

## 2022-06-24 DIAGNOSIS — R296 Repeated falls: Secondary | ICD-10-CM | POA: Diagnosis present

## 2022-06-24 DIAGNOSIS — I452 Bifascicular block: Secondary | ICD-10-CM | POA: Diagnosis present

## 2022-06-24 DIAGNOSIS — R1310 Dysphagia, unspecified: Secondary | ICD-10-CM | POA: Diagnosis present

## 2022-06-24 DIAGNOSIS — M810 Age-related osteoporosis without current pathological fracture: Secondary | ICD-10-CM | POA: Diagnosis present

## 2022-06-24 DIAGNOSIS — G20 Parkinson's disease: Secondary | ICD-10-CM | POA: Diagnosis present

## 2022-06-24 DIAGNOSIS — Z79899 Other long term (current) drug therapy: Secondary | ICD-10-CM

## 2022-06-24 DIAGNOSIS — Z825 Family history of asthma and other chronic lower respiratory diseases: Secondary | ICD-10-CM

## 2022-06-24 DIAGNOSIS — R4781 Slurred speech: Secondary | ICD-10-CM | POA: Diagnosis present

## 2022-06-24 DIAGNOSIS — Z8249 Family history of ischemic heart disease and other diseases of the circulatory system: Secondary | ICD-10-CM

## 2022-06-24 DIAGNOSIS — Z803 Family history of malignant neoplasm of breast: Secondary | ICD-10-CM

## 2022-06-24 DIAGNOSIS — Z905 Acquired absence of kidney: Secondary | ICD-10-CM

## 2022-06-24 DIAGNOSIS — I10 Essential (primary) hypertension: Secondary | ICD-10-CM | POA: Diagnosis present

## 2022-06-24 DIAGNOSIS — R531 Weakness: Principal | ICD-10-CM

## 2022-06-24 DIAGNOSIS — G8191 Hemiplegia, unspecified affecting right dominant side: Secondary | ICD-10-CM | POA: Diagnosis present

## 2022-06-24 DIAGNOSIS — I69391 Dysphagia following cerebral infarction: Secondary | ICD-10-CM

## 2022-06-24 DIAGNOSIS — Z7983 Long term (current) use of bisphosphonates: Secondary | ICD-10-CM

## 2022-06-24 DIAGNOSIS — F0280 Dementia in other diseases classified elsewhere without behavioral disturbance: Secondary | ICD-10-CM | POA: Diagnosis present

## 2022-06-24 DIAGNOSIS — Z66 Do not resuscitate: Secondary | ICD-10-CM | POA: Diagnosis present

## 2022-06-24 DIAGNOSIS — R29705 NIHSS score 5: Secondary | ICD-10-CM | POA: Diagnosis present

## 2022-06-24 DIAGNOSIS — R338 Other retention of urine: Secondary | ICD-10-CM | POA: Diagnosis present

## 2022-06-24 DIAGNOSIS — E785 Hyperlipidemia, unspecified: Secondary | ICD-10-CM | POA: Diagnosis present

## 2022-06-24 LAB — COMPREHENSIVE METABOLIC PANEL
ALT: <5 U/L (ref 0–44)
AST: 17 U/L (ref 15–41)
Albumin: 4.2 g/dL (ref 3.5–5.0)
Alkaline Phosphatase: 77 U/L (ref 38–126)
Anion gap: 8 (ref 5–15)
BUN: 26 mg/dL — ABNORMAL HIGH (ref 8–23)
CO2: 26 mmol/L (ref 22–32)
Calcium: 8.9 mg/dL (ref 8.9–10.3)
Chloride: 103 mmol/L (ref 98–111)
Creatinine, Ser: 0.95 mg/dL (ref 0.61–1.24)
GFR, Estimated: >60 mL/min (ref >60)
Glucose, Bld: 93 mg/dL (ref 70–99)
Potassium: 3.8 mmol/L (ref 3.5–5.1)
Sodium: 137 mmol/L (ref 135–145)
Total Bilirubin: 0.9 mg/dL (ref 0.3–1.2)
Total Protein: 7.1 g/dL (ref 6.5–8.1)

## 2022-06-24 LAB — CBC WITH DIFFERENTIAL/PLATELET
Abs Immature Granulocytes: 0.03 10*3/uL (ref 0.00–0.07)
Basophils Absolute: 0 10*3/uL (ref 0.0–0.1)
Basophils Relative: 1 %
Eosinophils Absolute: 0.2 10*3/uL (ref 0.0–0.5)
Eosinophils Relative: 3 %
HCT: 36.6 % — ABNORMAL LOW (ref 39.0–52.0)
Hemoglobin: 11.8 g/dL — ABNORMAL LOW (ref 13.0–17.0)
Immature Granulocytes: 1 %
Lymphocytes Relative: 18 %
Lymphs Abs: 0.9 10*3/uL (ref 0.7–4.0)
MCH: 29.8 pg (ref 26.0–34.0)
MCHC: 32.2 g/dL (ref 30.0–36.0)
MCV: 92.4 fL (ref 80.0–100.0)
Monocytes Absolute: 0.6 10*3/uL (ref 0.1–1.0)
Monocytes Relative: 12 %
Neutro Abs: 3.5 10*3/uL (ref 1.7–7.7)
Neutrophils Relative %: 65 %
Platelets: 181 10*3/uL (ref 150–400)
RBC: 3.96 MIL/uL — ABNORMAL LOW (ref 4.22–5.81)
RDW: 14.3 % (ref 11.5–15.5)
WBC: 5.4 10*3/uL (ref 4.0–10.5)
nRBC: 0 % (ref 0.0–0.2)

## 2022-06-24 LAB — URINALYSIS, ROUTINE W REFLEX MICROSCOPIC
Bilirubin Urine: NEGATIVE
Glucose, UA: NEGATIVE mg/dL
Ketones, ur: NEGATIVE mg/dL
Leukocytes,Ua: NEGATIVE
Nitrite: NEGATIVE
Protein, ur: NEGATIVE mg/dL
Specific Gravity, Urine: 1.005 (ref 1.005–1.030)
Squamous Epithelial / HPF: NONE SEEN (ref 0–5)
pH: 7 (ref 5.0–8.0)

## 2022-06-24 LAB — TROPONIN I (HIGH SENSITIVITY): Troponin I (High Sensitivity): 10 ng/L (ref <18)

## 2022-06-24 LAB — HEMOGLOBIN A1C
Hgb A1c MFr Bld: 5.3 % (ref 4.8–5.6)
Mean Plasma Glucose: 105.41 mg/dL

## 2022-06-24 MED ORDER — CARBIDOPA-LEVODOPA 25-100 MG PO TABS
1.5000 | ORAL_TABLET | Freq: Three times a day (TID) | ORAL | Status: DC
  Administered 2022-06-25 – 2022-06-29 (×13): 1.5 via ORAL
  Filled 2022-06-24 (×9): qty 2
  Filled 2022-06-24: qty 1.5
  Filled 2022-06-24 (×5): qty 2

## 2022-06-24 MED ORDER — LORAZEPAM 2 MG/ML IJ SOLN
INTRAMUSCULAR | Status: AC
  Administered 2022-06-24: 1 mg via INTRAVENOUS
  Filled 2022-06-24: qty 1

## 2022-06-24 MED ORDER — STROKE: EARLY STAGES OF RECOVERY BOOK: Freq: Once | Status: AC

## 2022-06-24 MED ORDER — ATORVASTATIN CALCIUM 20 MG PO TABS
20.0000 mg | ORAL_TABLET | Freq: Every day | ORAL | Status: DC
  Administered 2022-06-26 – 2022-06-27 (×2): 20 mg via ORAL
  Filled 2022-06-24 (×2): qty 1

## 2022-06-24 MED ORDER — SODIUM CHLORIDE 0.9 % IV BOLUS
500.0000 mL | Freq: Once | INTRAVENOUS | Status: AC
  Administered 2022-06-24: 500 mL via INTRAVENOUS

## 2022-06-24 MED ORDER — VITAMIN B-12 1000 MCG PO TABS
1000.0000 ug | ORAL_TABLET | ORAL | Status: DC
  Administered 2022-06-28: 1000 ug via ORAL
  Filled 2022-06-24: qty 1

## 2022-06-24 MED ORDER — ACETAMINOPHEN 500 MG PO TABS: 1000.0000 mg | ORAL_TABLET | Freq: Three times a day (TID) | ORAL | Status: DC | PRN

## 2022-06-24 MED ORDER — OCUVITE-LUTEIN PO CAPS
1.0000 | ORAL_CAPSULE | Freq: Every day | ORAL | Status: DC
  Administered 2022-06-27 – 2022-06-29 (×3): 1 via ORAL
  Filled 2022-06-24 (×5): qty 1

## 2022-06-24 MED ORDER — ASPIRIN 81 MG PO TBEC: 81.0000 mg | DELAYED_RELEASE_TABLET | Freq: Every day | ORAL | Status: DC

## 2022-06-24 MED ORDER — TRAMADOL HCL 50 MG PO TABS
50.0000 mg | ORAL_TABLET | Freq: Four times a day (QID) | ORAL | Status: DC | PRN
  Administered 2022-06-27: 50 mg via ORAL
  Filled 2022-06-24: qty 1

## 2022-06-24 MED ORDER — HALOPERIDOL LACTATE 5 MG/ML IJ SOLN
1.0000 mg | Freq: Four times a day (QID) | INTRAMUSCULAR | Status: DC | PRN
  Administered 2022-06-24 – 2022-06-25 (×3): 1 mg via INTRAVENOUS
  Filled 2022-06-24 (×3): qty 1

## 2022-06-24 MED ORDER — CARBIDOPA-LEVODOPA 25-100 MG PO TABS
1.0000 | ORAL_TABLET | ORAL | Status: DC
Start: 2022-06-24 — End: 2022-06-29
  Administered 2022-06-25 – 2022-06-29 (×8): 1 via ORAL
  Filled 2022-06-24 (×8): qty 1

## 2022-06-24 MED ORDER — ESCITALOPRAM OXALATE 10 MG PO TABS
10.0000 mg | ORAL_TABLET | Freq: Every day | ORAL | Status: DC
  Administered 2022-06-26 – 2022-06-29 (×4): 10 mg via ORAL
  Filled 2022-06-24 (×4): qty 1

## 2022-06-24 MED ORDER — LORAZEPAM 2 MG/ML IJ SOLN: 1.0000 mg | Freq: Once | INTRAMUSCULAR | Status: AC

## 2022-06-24 MED ORDER — LABETALOL HCL 5 MG/ML IV SOLN: 10.0000 mg | Freq: Four times a day (QID) | INTRAVENOUS | Status: DC | PRN

## 2022-06-24 MED ORDER — CARBIDOPA-LEVODOPA 25-100 MG PO TABS
1.0000 | ORAL_TABLET | Freq: Once | ORAL | Status: DC
  Filled 2022-06-24: qty 1

## 2022-06-24 MED ORDER — ALBUTEROL SULFATE (2.5 MG/3ML) 0.083% IN NEBU: 2.5000 mg | INHALATION_SOLUTION | Freq: Four times a day (QID) | RESPIRATORY_TRACT | Status: DC | PRN

## 2022-06-24 MED ORDER — CARBIDOPA-LEVODOPA 25-100 MG PO TABS: 1.0000 | ORAL_TABLET | ORAL | Status: DC

## 2022-06-24 MED ORDER — RIVASTIGMINE TARTRATE 3 MG PO CAPS
3.0000 mg | ORAL_CAPSULE | Freq: Two times a day (BID) | ORAL | Status: DC
  Administered 2022-06-26 – 2022-06-27 (×3): 3 mg via ORAL
  Filled 2022-06-24 (×6): qty 1

## 2022-06-24 NOTE — Assessment & Plan Note (Addendum)
Presented with complaints of inability to ambulate and dropping things (patient is right-handed). 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  CTA head and neck negative for large vessel occlusions or flow-limiting stenoses.  Aortic atherosclerosis noted. Echo with EF normal 66 5%, grade 1 diastolic dysfunction, interatrial septum not well visualized. LDL is 79.  Hemoglobin A1c is 5.3%. --Appreciate neurology recommendations -- Continue aspirin 81 mg  -- No DAPT due to high falls risk -- Continue Lipitor 40 mg -- BP goal: Normotension --SLP following, on dysphagia diet with thickened liquids -- PT OT recommending SNF/rehab -- Outpatient neurology follow-up in 4 to 6 weeks

## 2022-06-24 NOTE — Assessment & Plan Note (Addendum)
BPs have been mildly elevated with systolic 872'B on his amlodipine 10 mg. -- Continue amlodipine -- Add losartan 25 mg daily -- Monitor BP 2-3 times daily for next week / until Primary Care follow up

## 2022-06-24 NOTE — ED Triage Notes (Signed)
BIB by [facility] from home. Generalized weakness since yesterday. Wife reports speech abnormalities. Stroke screen negative with EMS. 102/86 CBG 177

## 2022-06-24 NOTE — ED Provider Notes (Signed)
[facility] Provider Note    Event Date/Time   First MD Initiated Contact with Patient 06/24/22 1055     (approximate)   History   Weakness   HPI  Kevin [name] is a 88 y.o. male with history of Parkinson's disease, macular degeneration, dementia, hypertension who presents with generalized weakness since yesterday.  EMS reports that the wife got concerned he may have had a stroke.  He felt like he was going to fall yesterday while he was at the store.  The patient states he is feeling okay.  He denies any acute pain.  He does feel like his right leg is somewhat weak and he has difficulty controlling it.  He states that this started yesterday.      Physical Exam   Triage Vital Signs: ED Triage Vitals  Enc Vitals Group     BP 06/24/22 1056 (!) 159/83     Pulse Rate 06/24/22 1056 76     Resp 06/24/22 1056 16     Temp 06/24/22 1057 97.7 F (36.5 C)     Temp Source 06/24/22 1057 Oral     SpO2 06/24/22 1056 100 %     Weight 06/24/22 1053 157 lb 4.8 oz (71.4 kg)     Height 06/24/22 1053 6' (1.829 m)     Head Circumference --      Peak Flow --      Pain Score 06/24/22 1052 0     Pain Loc --      Pain Edu? --      Excl. in [place]? --     Most recent vital signs: Vitals:   06/24/22 1300 06/24/22 1330  BP: (!) 182/99 (!) 181/94  Pulse: 87 88  Resp: 20 18  Temp:    SpO2: 100% 100%     General: Alert and oriented, slightly slow to respond, no distress. CV:  Good peripheral perfusion.  Resp:  Normal effort.  Abd:  Soft and nontender.  No distention.  Other:  EOMI.  PERRLA.  Slight decreased motor strength right leg.  5/5 motor strength to all other extremities.  No ataxia. No facial droop.   ED Results / Procedures / Treatments   Labs (all labs ordered are listed, but only abnormal results are displayed) Labs Reviewed  COMPREHENSIVE METABOLIC PANEL - Abnormal; Notable for the following components:      Result Value   BUN 26 (*)    All other  components within normal limits  CBC WITH DIFFERENTIAL/PLATELET - Abnormal; Notable for the following components:   RBC 3.96 (*)    Hemoglobin 11.8 (*)    HCT 36.6 (*)    All other components within normal limits  URINALYSIS, ROUTINE W REFLEX MICROSCOPIC  TROPONIN I (HIGH SENSITIVITY)     EKG  ED ECG REPORT I, Arta Silence, the attending physician, personally viewed and interpreted this ECG.  Date: 06/24/2022 EKG Time: 1056 Rate: 75 Rhythm: normal sinus rhythm QRS Axis: normal Intervals: RBBB, LAFB ST/T Wave abnormalities: normal Narrative Interpretation: no evidence of acute ischemia    RADIOLOGY  CT head: I independently viewed and interpreted the images; there is no ICH or other acute traumatic finding.  Radiology report indicates no acute abnormalities.   PROCEDURES:  Critical Care performed: No  Procedures   MEDICATIONS ORDERED IN ED: Medications  sodium chloride 0.9 % bolus 500 mL ( Intravenous Restarted 06/24/22 1325)     IMPRESSION / MDM / [place] / ED  COURSE  I reviewed the triage vital signs and the nursing notes.  88 year old male with PMH as noted above presents with generalized weakness since yesterday.    I reviewed the past medical records.  The patient was most recently admitted in April 2022 and per the hospitalist discharge summary from 01/26/2021 he presented with epigastric and chest pain and was diagnosed with acute pancreatitis.  He was most recently seen in the ED on 8/22 with a fall and transient hypotension.  On exam currently the vital signs are normal except for hypertension.  Neurologic exam is nonfocal except for slight weakness of the right lower extremity which the patient states started yesterday.  Differential diagnosis includes, but is not limited to, CVA, TIA, dehydration, electrolyte abnormality, AKI, other metabolic disturbance, UTI or other infection, less likely cardiac cause.  Patient's presentation is most  consistent with acute presentation with potential threat to life or bodily function.  We will obtain CT head, lab work-up, give fluids, and reassess.  The patient is out of the code stroke window.  The patient is on the cardiac monitor to evaluate for evidence of arrhythmia and/or significant heart rate changes.  ----------------------------------------- 1:44 PM on 06/24/2022 -----------------------------------------  The wife is now here and confirms that the symptoms started yesterday when he was out at the store.  She states that the patient suddenly became unable to walk and has been generally weak since then.  CT head shows no acute findings.  Lab work-up so far is unremarkable.  There is no anemia, leukocytosis, or electrolyte abnormality.  Given the asymmetrical weakness, the patient will need further stroke work-up.  I consulted Dr. Pearlie Oyster from the hospitalist service; based on her discussion she agrees to admit the patient.   FINAL CLINICAL IMPRESSION(S) / ED DIAGNOSES   Final diagnoses:  Generalized weakness  Right leg weakness     Rx / DC Orders   ED Discharge Orders     None        Note:  This document was prepared using Dragon voice recognition software and may include unintentional dictation errors.    Arta Silence, MD 06/24/22 1345

## 2022-06-24 NOTE — Plan of Care (Signed)
  Problem: Clinical Measurements: Goal: Respiratory complications will improve Outcome: Progressing   Problem: Safety: Goal: Ability to remain free from injury will improve Outcome: Progressing  Safety sitter by bedside

## 2022-06-24 NOTE — ED Notes (Signed)
Mittens placed on patient. Wife at bedside attempting to reorient patient. Pt in no acute distress at this time.

## 2022-06-24 NOTE — Progress Notes (Signed)
       CROSS COVER NOTE  NAME: BARAN KUHRT MRN: [id number] DOB : 30-May-1933    Date of Service   06/24/2022   HPI/Events of Note   Message received from nursing reporting Mr Begley had not voided tonight and has lower abdominal distention. Bladder scan 79m.  Interventions   Plan:  Acute Urinary Retention In and Out x1     This document was prepared using Dragon voice recognition software and may include unintentional dictation errors.  KNeomia GlassDNP, MHA, FNP-BC Nurse Practitioner [facility] CSaint Joseph BereaPager ([phone]

## 2022-06-24 NOTE — ED Notes (Signed)
Pt removed second IV. New IV placed by this RN.

## 2022-06-24 NOTE — Assessment & Plan Note (Signed)
Continue PPI ?

## 2022-06-24 NOTE — ED Notes (Signed)
IVC/pending psych admit when medically cleared 

## 2022-06-24 NOTE — H&P (Addendum)
History and Physical    Patient: Kevin [name] DOB: 04-10-1933 DOA: 06/24/2022 DOS: the patient was seen and examined on 06/24/2022 PCP: Baxter Hire, MD  Patient coming from: ALF/ILF  Chief Complaint:  Chief Complaint  Patient presents with   Weakness    Most of the history was obtained from patient's wife at the bedside HPI: Kevin [name] is a 88 y.o. male with medical history significant for renal cancer status post nephrectomy, Parkinson's disease, dementia related to Parkinson's disease, hypertension who was brought into the ER by EMS for evaluation of weakness. Baseline patient usually ambulates with a rolling walker.  Patient's wife states that he was in his usual state of health one day prior to his admission and he had gone to breakfast and then went to the grocery store when all of a sudden he was unable to ambulate.  According to the patient he felt like his feet were stuck and he was unable to lift them.  This happened at about 3 PM the day prior to his admission.  He sat in his rollator and his wife was able to get him into the car and back to the assisted living facility.  She was able to get him into the apartment using a wheelchair. Patient fell later that day due to weakness and has not been able to ambulate since then. His wife called EMS due to his inability to ambulate. Wife states that he has had several falls in the last 2 months. Patient is hard of hearing but according to his wife has not had any slurred speech.  She noted that he had been dropping things and spilled his coffee this morning which is unusual for him.  Patient is right-handed. I am unable to do review of systems due to his underlying dementia He will be referred to observation status for further evaluation  Review of Systems: unable to review all systems due to the inability of the patient to answer questions. 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   Cataract cortical, senile    Chronic kidney disease    Colon polyps    Dementia ([place])    Parkinson's, loss of short term memory   E coli infection 2020   blood stream   GERD (gastroesophageal reflux disease)    HOH (hard of hearing)    extremely HOH, wears aids   Hyperlipidemia    Hypertension    Osteoporosis    Pancreatitis    Pneumonia    AS A CHILD   Past Surgical History:  Procedure Laterality Date   BACK SURGERY     CATARACT EXTRACTION W/PHACO Left 05/16/2020   Procedure: CATARACT EXTRACTION PHACO AND INTRAOCULAR LENS PLACEMENT (IOC) LEFT 7.27 00:39.0 ;  Surgeon: Birder Robson, MD;  Location: [place];  Service: Ophthalmology;  Laterality: Left;   CATARACT EXTRACTION W/PHACO Right 06/07/2020   Procedure: CATARACT EXTRACTION PHACO AND INTRAOCULAR LENS PLACEMENT (IOC) RIGHT 11.56 00:54.9;  Surgeon: Birder Robson, MD;  Location: [place];  Service: Ophthalmology;  Laterality: Right;   COLONOSCOPY  2001, 2006, 2012   ESOPHAGOGASTRODUODENOSCOPY  09/11/2000   FLEXIBLE SIGMOIDOSCOPY  03/02/1997   KYPHOPLASTY N/A 11/13/2019   Procedure: T12 KYPHOPLASTY;  Surgeon: Hessie Knows, MD;  Location:  [facility];  Service: Orthopedics;  Laterality: N/A;   KYPHOPLASTY N/A 12/03/2019   Procedure: L1 KYPHOPLASTY;  Surgeon: Hessie Knows, MD;  Location: [facility];  Service: Orthopedics;  Laterality: N/A;   KYPHOPLASTY N/A 02/28/2021   Procedure: T9 & T10 KYPHOPLASTY;  Surgeon: Hessie Knows, MD;  Location: [facility];  Service: Orthopedics;  Laterality: N/A;   NASAL POLYP EXCISION     NEPHRECTOMY Right 2020   ROBOT ASSISTED LAPAROSCOPIC NEPHRECTOMY Right 11/17/2018   Procedure: ROBOTIC ASSISTED LAPAROSCOPIC NEPHRECTOMY;  Surgeon: Hollice Espy, MD;  Location: [facility];  Service: Urology;  Laterality: Right;   XI ROBOTIC ASSISTED INGUINAL HERNIA REPAIR WITH  MESH Left 12/06/2021   Procedure: XI ROBOTIC ASSISTED INGUINAL HERNIA REPAIR WITH MESH;  Surgeon: Herbert Pun, MD;  Location: [facility];  Service: General;  Laterality: Left;   Social History:  reports that he has never smoked. He has never used smokeless tobacco. He reports that he does not drink alcohol and does not use drugs.  No Known Allergies  Family History  Problem Relation Age of Onset   Heart disease Mother    Pancreatitis Mother    Emphysema Father    Breast cancer Sister    Breast cancer Sister     Prior to Admission medications   Medication Sig Start Date End Date Taking? Authorizing Provider  acetaminophen (TYLENOL) 500 MG tablet Take 1,000 mg by mouth every 8 (eight) hours as needed for moderate pain.    [provider]  albuterol (VENTOLIN HFA) 108 (90 Base) MCG/ACT inhaler Inhale 2 puffs into the lungs every 6 (six) hours as needed for wheezing or shortness of breath.    [provider]  alendronate (FOSAMAX) 70 MG tablet Take 70 mg by mouth every Sunday.    [provider]  amLODipine (NORVASC) 10 MG tablet Take 10 mg by mouth daily.    [provider]  atorvastatin (LIPITOR) 20 MG tablet Take 20 mg by mouth daily.    [provider]  carbidopa-levodopa (SINEMET IR) 25-100 MG tablet Take 1-1.5 tablets by mouth See admin instructions. Take 1.5 tablet (0800) by mouth in the morning, then 1.5 tablet (1200) by mouth at noon, then 1.5 tablet by mouth at 5 pm (1700), then 1 tablet by mouth at bedtime (2200), then 1 tablet at 0300. 06/15/19   [provider]  cyanocobalamin (VITAMIN B12) 1000 MCG tablet Take 1 tablet (1,000 mcg total) by mouth 2 (two) times a week. 06/18/22   Earlie Server, MD  diclofenac Sodium (VOLTAREN) 1 % GEL Apply 1 application topically 2 (two) times daily as needed (pain).    [provider]  escitalopram (LEXAPRO) 10 MG tablet Take 10 mg by mouth daily.    [provider]   Menthol-Methyl Salicylate (BEN GAY GREASELESS) 10-15 % greaseless cream Apply 1 application topically daily as needed for pain.    [provider]  Multiple Vitamins-Minerals (PRESERVISION AREDS 2 PO) Take 1 tablet by mouth in the morning and at bedtime.    [provider]  nystatin cream (MYCOSTATIN) Apply 1 application topically 2 (two) times daily as needed for dry skin (on bottom).    [provider]  rivastigmine (EXELON) 3 MG capsule Take 3 mg by mouth 2 (two) times daily with a meal.    [provider]  traMADol (ULTRAM) 50 MG tablet Take 1 tablet (50 mg total) by mouth every 6 (six) hours as needed. 12/06/21 12/06/22  Herbert Pun, MD  Physical Exam: Vitals:   06/24/22 1230 06/24/22 1300 06/24/22 1330 06/24/22 1400  BP: (!) 170/92 (!) 182/99 (!) 181/94 (!) 166/91  Pulse: 80 87 88 88  Resp: '16 20 18 15  '$ Temp:      TempSrc:      SpO2: 100% 100% 100% 100%  Weight:      Height:       Physical Exam Vitals and nursing note reviewed.  Constitutional:      Comments: Oriented only to person.  Hard of hearing.  Able to move all extremities  HENT:     Head: Normocephalic and atraumatic.     Nose: Nose normal.     Mouth/Throat:     Mouth: Mucous membranes are moist.  Eyes:     Comments: Pale conjunctiva  Cardiovascular:     Rate and Rhythm: Normal rate and regular rhythm.  Pulmonary:     Effort: Pulmonary effort is normal.     Breath sounds: Normal breath sounds.  Abdominal:     General: Abdomen is flat. Bowel sounds are normal.     Palpations: Abdomen is soft.  Musculoskeletal:        General: Normal range of motion.     Cervical back: Normal range of motion and neck supple.  Skin:    Findings: Bruising present.     Comments: Noted to have bruising involving his extremities  Neurological:     Mental Status: He is alert.     Comments: Able to move his extremities.  Oriented to person  Psychiatric:        Mood and Affect: Mood  normal.        Behavior: Behavior normal.     Data Reviewed: Relevant notes from primary care and specialist visits, past discharge summaries as available in EHR, including Care Everywhere. Prior diagnostic testing as pertinent to current admission diagnoses Updated medications and problem lists for reconciliation ED course, including vitals, labs, imaging, treatment and response to treatment Triage notes, nursing and pharmacy notes and ED provider's notes Notable results as noted in HPI Labs reviewed.  Sodium 137, potassium 3.8, chloride 103, bicarb 26, glucose 93, BUN 26, creatinine 0.95, calcium 8.9, total protein 7.1, albumin 4.2, AST 17, ALT less than 5, alkaline phosphatase 77, total bilirubin 0.9, White count 5.4, hemoglobin 11.8, hematocrit 36, platelet count 181 CT scan of the head without contrast shows No acute intracranial abnormality.Unchanged mild sequela of chronic small vessel ischemic disease. Twelve-lead EKG reviewed by me shows sinus rhythm.  Right bundle branch block and left posterior fascicular block There are no new results to review at this time.  Assessment and Plan: * TIA (transient ischemic attack) Rule out an acute CVA Patient presents to the ER for evaluation of sudden onset of inability to ambulate and dropping things (patient is right-handed) Initial CT scan of the head without contrast is negative for bleed We will obtain MRI of the brain to rule out an acute infarct Obtain 2D echocardiogram to assess LVEF and rule out cardiac thrombus We will request PT/OT/ST consult Allow for permissive hypertension until an acute stroke is ruled out We will consult neurology if MRI shows an acute infarct Place patient on aspirin and statins  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HCC) Continue Sinemet for patient's known Parkinson's disease as well as Lexapro and rivastigmine for dementia  Hypertension We will treat systolic blood  pressure greater than 782 or diastolic blood pressure greater than 110 with IV labetalol  GERD (gastroesophageal reflux disease) Continue PPI      Advance Care Planning:   Code Status: DNR   Consults: Neurology  Family Communication: Greater than 50% of time was spent discussing patient's condition and plan of care with his wife at the bedside.  All questions and concerns have been addressed.  CODE STATUS was discussed and patient's wife wants him placed on a DO NOT RESUSCITATE status.  Severity of Illness: The appropriate patient status for this patient is OBSERVATION. Observation status is judged to be reasonable and necessary in order to provide the required intensity of service to ensure the patient's safety. The patient's presenting symptoms, physical exam findings, and initial radiographic and laboratory data in the context of their medical condition is felt to place them at decreased risk for further clinical deterioration. Furthermore, it is anticipated that the patient will be medically stable for discharge from the hospital within 2 midnights of admission.   Author: Collier Bullock, MD 06/24/2022 2:23 PM  For on call review [email or web address].

## 2022-06-24 NOTE — Progress Notes (Signed)
*  PRELIMINARY RESULTS* Echocardiogram 2D Echocardiogram has been performed.  Kevin [name] 06/24/2022, 3:37 PM

## 2022-06-24 NOTE — ED Notes (Signed)
Patient removed third IV.

## 2022-06-24 NOTE — Progress Notes (Signed)
Patient admitted from ED to room 108. Very agitated and has already pulled 3 IVs. Has no PIV at the moment. High fall risk. MD informed via secure chat. Sitter and prn Haldol ordered.   Sitter by the bedside. PIV successfully placed and secured with kerlix. Haldol prn dose administered.

## 2022-06-24 NOTE — Assessment & Plan Note (Addendum)
Continue Sinemet, Lexapro. Stop Exelon, this was recently tapered down and then stopped in the outpatient setting after most recent neurology follow-up with Dr. Manuella Ghazi. --Delirium precautions -- Follow-up with Neurology in 4-6 weeks

## 2022-06-24 NOTE — ED Notes (Signed)
Pt called out because R IV "fell out" and IVF was spilling onto bed. Placed new IV to L AC and restarted fluid bolus.

## 2022-06-24 NOTE — ED Notes (Signed)
Pt removed IV. New IV placed by Elie Goody, RN.

## 2022-06-25 ENCOUNTER — Inpatient Hospital Stay: Payer: Medicare HMO

## 2022-06-25 DIAGNOSIS — R296 Repeated falls: Secondary | ICD-10-CM | POA: Diagnosis present

## 2022-06-25 DIAGNOSIS — Z85828 Personal history of other malignant neoplasm of skin: Secondary | ICD-10-CM | POA: Diagnosis not present

## 2022-06-25 DIAGNOSIS — I639 Cerebral infarction, unspecified: Secondary | ICD-10-CM

## 2022-06-25 DIAGNOSIS — Z85528 Personal history of other malignant neoplasm of kidney: Secondary | ICD-10-CM | POA: Diagnosis not present

## 2022-06-25 DIAGNOSIS — G459 Transient cerebral ischemic attack, unspecified: Secondary | ICD-10-CM | POA: Diagnosis present

## 2022-06-25 DIAGNOSIS — R1310 Dysphagia, unspecified: Secondary | ICD-10-CM | POA: Diagnosis present

## 2022-06-25 DIAGNOSIS — I452 Bifascicular block: Secondary | ICD-10-CM | POA: Diagnosis present

## 2022-06-25 DIAGNOSIS — Z66 Do not resuscitate: Secondary | ICD-10-CM | POA: Diagnosis present

## 2022-06-25 DIAGNOSIS — E785 Hyperlipidemia, unspecified: Secondary | ICD-10-CM | POA: Diagnosis present

## 2022-06-25 DIAGNOSIS — M810 Age-related osteoporosis without current pathological fracture: Secondary | ICD-10-CM | POA: Diagnosis present

## 2022-06-25 DIAGNOSIS — E876 Hypokalemia: Secondary | ICD-10-CM | POA: Diagnosis present

## 2022-06-25 DIAGNOSIS — F0280 Dementia in other diseases classified elsewhere without behavioral disturbance: Secondary | ICD-10-CM | POA: Diagnosis present

## 2022-06-25 DIAGNOSIS — G20 Parkinson's disease: Secondary | ICD-10-CM | POA: Diagnosis present

## 2022-06-25 DIAGNOSIS — G8191 Hemiplegia, unspecified affecting right dominant side: Secondary | ICD-10-CM | POA: Diagnosis present

## 2022-06-25 DIAGNOSIS — I6381 Other cerebral infarction due to occlusion or stenosis of small artery: Secondary | ICD-10-CM | POA: Diagnosis present

## 2022-06-25 DIAGNOSIS — Z8249 Family history of ischemic heart disease and other diseases of the circulatory system: Secondary | ICD-10-CM | POA: Diagnosis not present

## 2022-06-25 DIAGNOSIS — Z79899 Other long term (current) drug therapy: Secondary | ICD-10-CM | POA: Diagnosis not present

## 2022-06-25 DIAGNOSIS — Z515 Encounter for palliative care: Secondary | ICD-10-CM | POA: Diagnosis not present

## 2022-06-25 DIAGNOSIS — I10 Essential (primary) hypertension: Secondary | ICD-10-CM | POA: Diagnosis present

## 2022-06-25 DIAGNOSIS — R339 Retention of urine, unspecified: Secondary | ICD-10-CM | POA: Diagnosis present

## 2022-06-25 DIAGNOSIS — R4781 Slurred speech: Secondary | ICD-10-CM | POA: Diagnosis present

## 2022-06-25 DIAGNOSIS — H9190 Unspecified hearing loss, unspecified ear: Secondary | ICD-10-CM | POA: Diagnosis present

## 2022-06-25 DIAGNOSIS — K219 Gastro-esophageal reflux disease without esophagitis: Secondary | ICD-10-CM | POA: Diagnosis present

## 2022-06-25 DIAGNOSIS — R34 Anuria and oliguria: Secondary | ICD-10-CM | POA: Diagnosis not present

## 2022-06-25 DIAGNOSIS — Z7189 Other specified counseling: Secondary | ICD-10-CM | POA: Diagnosis not present

## 2022-06-25 DIAGNOSIS — R29705 NIHSS score 5: Secondary | ICD-10-CM | POA: Diagnosis present

## 2022-06-25 DIAGNOSIS — Z7983 Long term (current) use of bisphosphonates: Secondary | ICD-10-CM | POA: Diagnosis not present

## 2022-06-25 LAB — ECHOCARDIOGRAM COMPLETE
AR max vel: 2.15 cm2
AV Peak grad: 12.8 mmHg
Ao pk vel: 1.79 m/s
Area-P 1/2: 9.03 cm2
Height: 72 in
S' Lateral: 2.66 cm
Weight: 2516.8 oz

## 2022-06-25 LAB — LIPID PANEL
Cholesterol: 152 mg/dL (ref 0–200)
HDL: 63 mg/dL (ref >40)
LDL Cholesterol: 79 mg/dL (ref 0–99)
Total CHOL/HDL Ratio: 2.4 RATIO
Triglycerides: 48 mg/dL (ref <150)
VLDL: 10 mg/dL (ref 0–40)

## 2022-06-25 MED ORDER — ASPIRIN 300 MG RE SUPP
300.0000 mg | Freq: Every day | RECTAL | Status: DC
  Filled 2022-06-25: qty 1

## 2022-06-25 MED ORDER — ASPIRIN 81 MG PO CHEW
81.0000 mg | CHEWABLE_TABLET | Freq: Every day | ORAL | Status: DC
  Administered 2022-06-25 – 2022-06-29 (×5): 81 mg via ORAL
  Filled 2022-06-25 (×5): qty 1

## 2022-06-25 MED ORDER — IOHEXOL 350 MG/ML SOLN
50.0000 mL | Freq: Once | INTRAVENOUS | Status: AC | PRN
  Administered 2022-06-25: 50 mL via INTRAVENOUS

## 2022-06-25 MED ORDER — HALOPERIDOL LACTATE 5 MG/ML IJ SOLN
3.0000 mg | Freq: Four times a day (QID) | INTRAMUSCULAR | Status: DC | PRN
  Administered 2022-06-25: 3 mg via INTRAVENOUS
  Filled 2022-06-25: qty 1

## 2022-06-25 NOTE — Progress Notes (Signed)
PROGRESS NOTE  Kevin [name]  FIE:[id number] DOB: 1933/04/25 DOA: 06/24/2022 PCP: Kevin Hire, MD   Brief Narrative: Patient is a 88 year old male with history of Parkinson's disease, dementia with behavioral disturbances, renal cell carcinoma, hypertension, hyperlipidemia who presented to the emergency department with complaints of generalized weakness more on the right leg for 1 day, slurred speech.  Patient was admitted for the suspicion of  stroke.  MRI of the brain showed a small stroke in the internal capsule on the left.  Neurology consulted .  Started stroke work-up.  Assessment & Plan:  Principal Problem:   TIA (transient ischemic attack) Active Problems:   GERD (gastroesophageal reflux disease)   Hypertension   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HCC)   CVA (cerebral vascular accident) ([place])  Acute ischemic stroke:Patient presented to the emergency department with complaints of generalized weakness more on the right leg for 1 day, slurred speech.   MRI of the brain showed a small stroke in the internal capsule on the left.  Neurology consulted .  Started stroke work-up. Neurology planning for CT angio head and neck LDL of 79.  Hemoglobin A1c of 5.3. Currently on Lipitor 40 mg daily, aspirin.  2D echo pending. We will allow permissive hypertension.  Parkinson disease/dementia: Takes Sinemet, rivastigmine.  Patient agitated.  Because of swallowing issues, we  might need to put  NG tube for temporary feeding/medication administration.  If he passes swallow evaluation by speech, no need.Currently confused. As per wife, he is usually alert and oriented and ambulatory with a walker.  Hypertension: Currently mildly hypertensive.  Will allow permissive hypertension .  Continue current medications for severe hypertension  GERD: On PPI.  Hyperlipidemia: Takes Lipitor at home, dose doubled to 40 mg daily          DVT prophylaxis:SCD's Start:  06/24/22 1401     Code Status: DNR  Family Communication: Wife on phone on 9/4  Patient status: Inpatient  Patient is from : Home  Anticipated discharge to: Home versus SNF  Estimated DC date: After full work-up for stroke, neurology clearance   Consultants: Neurology  Procedures: None yet  Antimicrobials:  Anti-infectives (From admission, onward)    None       Subjective: Patient seen and examined at the bedside this morning.  Hemodynamically stable during my evaluation.  He was lying on the chair.  He was confused, his eyes were closed.  He did not follow much commands.  Not in any Distress.  Objective: Vitals:   06/24/22 2004 06/25/22 0011 06/25/22 0252 06/25/22 0740  BP: (!) 158/132 (!) 154/98 (!) 148/100 (!) 160/98  Pulse: (!) 102 90 90 86  Resp: '20 20 20 17  '$ Temp: 98.1 F (36.7 C) 98.6 F (37 C) 99.2 F (37.3 C) 99.1 F (37.3 C)  TempSrc:      SpO2: 100% 97% 99% 98%  Weight:      Height:        Intake/Output Summary (Last 24 hours) at 06/25/2022 0915 Last data filed at 06/25/2022 0815 Gross per 24 hour  Intake 500 ml  Output 1700 ml  Net -1200 ml   Filed Weights   06/24/22 1053  Weight: 71.4 kg    Examination:  General exam: not in distress, confused, weak HEENT: Closed eyes Respiratory system:  no wheezes or crackles  Cardiovascular system: S1 & S2 heard, RRR.  Gastrointestinal system: Abdomen is nondistended, soft and nontender. Central nervous system: Awake but not alert or  oriented, did not follow commands Extremities: No edema, no clubbing ,no cyanosis Skin: No rashes, no ulcers,no icterus     Data Reviewed: I have personally reviewed following labs and imaging studies  CBC: Recent Labs  Lab 06/24/22 1059  WBC 5.4  NEUTROABS 3.5  HGB 11.8*  HCT 36.6*  MCV 92.4  PLT 518   Basic Metabolic Panel: Recent Labs  Lab 06/24/22 1059  NA 137  K 3.8  CL 103  CO2 26  GLUCOSE 93  BUN 26*  CREATININE 0.95  CALCIUM 8.9     No  results found for this or any previous visit (from the past 240 hour(s)).   Radiology Studies: ECHOCARDIOGRAM COMPLETE  Result Date: 06/25/2022    ECHOCARDIOGRAM REPORT   Patient Name:   Kevin [name] Date of Exam: 06/24/2022 Medical Rec #:  [id number]    Height:       72.0 in Accession #:    [id number]   Weight:       157.3 lb Date of Birth:  [birth year]    BSA:          1.924 m Patient Age:    75 years     BP:           166/91 mmHg Patient Gender: M            HR:           101 bpm. Exam Location:  [facility] Procedure: 2D Echo Indications:     Stroke I63.9  History:         Patient has no prior history of Echocardiogram examinations.  Sonographer:     Kathlen Brunswick RDCS Referring Phys:  [id number] AGBATA Diagnosing Phys: Nelva Bush MD  Sonographer Comments: Technically difficult study due to poor echo windows. Image acquisition challenging due to uncooperative patient. IMPRESSIONS  1. Left ventricular ejection fraction, by estimation, is 60 to 65%. The left ventricle has normal function. The left ventricle has no regional wall motion abnormalities. Left ventricular diastolic parameters are consistent with Grade I diastolic dysfunction (impaired relaxation).  2. Right ventricular systolic function is normal. The right ventricular size is normal. Tricuspid regurgitation signal is inadequate for assessing PA pressure.  3. The mitral valve is grossly normal. No evidence of mitral valve regurgitation. No evidence of mitral stenosis.  4. The aortic valve is tricuspid. Aortic valve regurgitation is not visualized. No aortic stenosis is present. FINDINGS  Left Ventricle: Left ventricular ejection fraction, by estimation, is 60 to 65%. The left ventricle has normal function. The left ventricle has no regional wall motion abnormalities. The left ventricular internal cavity size was normal in size. There is  borderline left ventricular hypertrophy. Left ventricular diastolic parameters are consistent with Grade I  diastolic dysfunction (impaired relaxation). Right Ventricle: The right ventricular size is normal. No increase in right ventricular wall thickness. Right ventricular systolic function is normal. Tricuspid regurgitation signal is inadequate for assessing PA pressure. Left Atrium: Left atrial size was normal in size. Right Atrium: Right atrial size was normal in size. Pericardium: The pericardium was not well visualized. Presence of epicardial fat layer. Mitral Valve: The mitral valve is grossly normal. No evidence of mitral valve regurgitation. No evidence of mitral valve stenosis. Tricuspid Valve: The tricuspid valve is not well visualized. Tricuspid valve regurgitation is not demonstrated. Aortic Valve: The aortic valve is tricuspid. Aortic valve regurgitation is not visualized. No aortic stenosis is present. Aortic valve peak gradient measures 12.8 mmHg. Pulmonic Valve: The  pulmonic valve was not well visualized. Pulmonic valve regurgitation is not visualized. No evidence of pulmonic stenosis. Aorta: The aortic root is normal in size and structure. Pulmonary Artery: The pulmonary artery is of normal size. Venous: The inferior vena cava was not well visualized. IAS/Shunts: The interatrial septum was not well visualized.  LEFT VENTRICLE PLAX 2D LVIDd:         3.86 cm   Diastology LVIDs:         2.66 cm   LV e' medial:    6.96 cm/s LV PW:         1.27 cm   LV E/e' medial:  13.4 LV IVS:        1.20 cm   LV e' lateral:   7.72 cm/s LVOT diam:     2.10 cm   LV E/e' lateral: 12.0 LV SV:         73 LV SV Index:   38 LVOT Area:     3.46 cm  RIGHT VENTRICLE RV Basal diam:  2.03 cm TAPSE (M-mode): 1.6 cm LEFT ATRIUM             Index        RIGHT ATRIUM          Index LA diam:        4.80 cm 2.50 cm/m   RA Area:     8.86 cm LA Vol (A2C):   20.3 ml 10.55 ml/m  RA Volume:   15.40 ml 8.00 ml/m LA Vol (A4C):   17.4 ml 9.04 ml/m LA Biplane Vol: 19.6 ml 10.19 ml/m  AORTIC VALVE AV Area (Vmax): 2.15 cm AV Vmax:         179.00 cm/s AV Peak Grad:   12.8 mmHg LVOT Vmax:      111.00 cm/s LVOT Vmean:     74.300 cm/s LVOT VTI:       0.210 m  AORTA Ao Root diam: 3.50 cm MITRAL VALVE MV Area (PHT): 9.03 cm     SHUNTS MV Decel Time: 84 msec      Systemic VTI:  0.21 m MV E velocity: 93.00 cm/s   Systemic Diam: 2.10 cm MV A velocity: 116.00 cm/s MV E/A ratio:  0.80 Harrell Gave End MD Electronically signed by Nelva Bush MD Signature Date/Time: 06/25/2022/8:15:35 AM    Final    MR BRAIN WO CONTRAST  Result Date: 06/24/2022 CLINICAL DATA:  Stroke suspected.  Right leg weakness. EXAM: MRI HEAD WITHOUT CONTRAST TECHNIQUE: Multiplanar, multiecho pulse sequences of the brain and surrounding structures were obtained without intravenous contrast. COMPARISON:  Head CT 06/24/2022 and MRI 04/01/2020 FINDINGS: Brain: There is a small acute infarct in the posterior limb of the left internal capsule. Small T2 hyperintensities elsewhere in the cerebral white matter bilaterally have minimally progressed from the prior MRI and are nonspecific but compatible with mild chronic small vessel ischemic disease. There is mild cerebral atrophy. Vascular: Major intracranial vascular flow voids are preserved. Skull and upper cervical spine: Unremarkable bone marrow signal. Sinuses/Orbits: Bilateral cataract extraction. Small mucous retention cyst in the left maxillary sinus. Clear mastoid air cells. Other: None. IMPRESSION: 1. Small acute infarct in the posterior limb of the left internal capsule. 2. Mild chronic small vessel ischemic disease. Electronically Signed   By: Logan Bores M.D.   On: 06/24/2022 16:00   CT Head Wo Contrast  Result Date: 06/24/2022 CLINICAL DATA:  Neuro deficit, acute, stroke suspected EXAM: CT HEAD WITHOUT CONTRAST TECHNIQUE: Contiguous axial images were  obtained from the base of the skull through the vertex without intravenous contrast. RADIATION DOSE REDUCTION: This exam was performed according to the departmental  dose-optimization program which includes automated exposure control, adjustment of the mA and/or kV according to patient size and/or use of iterative reconstruction technique. COMPARISON:  Head CT 06/12/2022 FINDINGS: Brain: No evidence of acute intracranial hemorrhage or abnormal extra-axial collection. Chronic basal ganglia lacunar infarcts. No new loss of gray-white matter differentiation.Patent basal cisterns. No concerning mass effect.The ventricles are unchanged in size.Scattered subcortical and periventricular white matter hypodensities, nonspecific but likely sequela of chronic small vessel ischemic disease.Mild cerebral atrophy Vascular: No hyperdense vessel.  Vascular calcifications. Skull: Negative for skull fracture. Chronic right nasal bone deformity. Sinuses/Orbits: Mild ethmoid air cell mucosal thickening. Orbits are unremarkable. Mastoid air cells are clear. Other: None. IMPRESSION: No acute intracranial abnormality. Unchanged mild sequela of chronic small vessel ischemic disease. Electronically Signed   By: Maurine Simmering M.D.   On: 06/24/2022 11:56    Scheduled Meds:  aspirin  81 mg Oral Daily   Or   aspirin  300 mg Rectal Daily   atorvastatin  20 mg Oral Daily   carbidopa-levodopa  1 tablet Oral Once   carbidopa-levodopa  1 tablet Oral 2 times per day   carbidopa-levodopa  1.5 tablet Oral TID   cyanocobalamin  1,000 mcg Oral Once per day on Mon Thu   escitalopram  10 mg Oral Daily   multivitamin-lutein  1 capsule Oral Daily   rivastigmine  3 mg Oral BID WC   Continuous Infusions:   LOS: 0 days   Shelly Coss, MD [facility] P9/01/2022, 9:15 AM

## 2022-06-25 NOTE — Evaluation (Addendum)
Occupational Therapy Evaluation Patient Details [name]: Kevin [name] MRN: [id number] DOB: 07-14-1933 Today's Date: 06/25/2022   History of Present Illness Kevin [name] is a 88 y.o. male with medical history significant for renal cancer status post nephrectomy, Parkinson's disease, dementia related to Parkinson's disease, hypertension who was brought into the ER by EMS for evaluation of weakness. MRI brain: "small acute infarct in the posterior limb of the left internal capsule."   Clinical Impression   Patient agreeable to OT/PT co-evaluation. Patient presenting with impaired cognition, safety awareness, strength, and balance impacting safety and independence in ADLs. Patient is a poor historian, per chart review he lives in an ALF with wife, uses RW or rollator for functional mobility, and wife assists with IADLs. Patient currently functioning at [facility] A for bed mobility, Min A x2 for functional transfers using RW, and Max A for LB dressing. He was oriented to self only, however, able to state Kevin [name] and where he lives. Patient required max multimodal cues throughout evaluation due to decreased alertness and inconsistently following one step commands. Would benefit from further vision and coordination testing in functional context as unable to complete formal assessment this date. Patient will benefit from acute OT while in this hospital as well as STR upon discharge to increase overall independence in the areas of ADLs and functional mobility prior to return home.      Recommendations for follow up therapy are one component of a multi-disciplinary discharge planning process, led by the attending physician.  Recommendations may be updated based on patient status, additional functional criteria and insurance authorization.   Follow Up Recommendations  Skilled nursing-short term rehab (<3 hours/day)    Assistance Recommended at Discharge Frequent or constant Supervision/Assistance  Patient can  return home with the following A lot of help with walking and/or transfers;A lot of help with bathing/dressing/bathroom;Direct supervision/assist for medications management;Direct supervision/assist for financial management;Assistance with cooking/housework;Assist for transportation;Help with stairs or ramp for entrance    Functional Status Assessment  Patient has had a recent decline in their functional status and demonstrates the ability to make significant improvements in function in a reasonable and predictable amount of time.  Equipment Recommendations  Other (comment) (defer to post acute)    Recommendations for Other Services       Precautions / Restrictions Precautions Precautions: Fall Restrictions Weight Bearing Restrictions: No      Mobility Bed Mobility Overal bed mobility: Needs Assistance Bed Mobility: Supine to Sit     Supine to sit: HOB elevated, Min assist     General bed mobility comments: Min A x1 for supine to sit    Transfers Overall transfer level: Needs assistance Equipment used: Rolling walker (2 wheels) Transfers: Sit to/from Stand, Bed to chair/wheelchair/BSC Sit to Stand: Min assist Stand pivot transfers: Min assist, +2 physical assistance         General transfer comment: STS from EOB and stand pivot from EOB > recliner with Min A x2 + RW. Min A x1 for RW management      Balance Overall balance assessment: Needs assistance Sitting-balance support: Feet supported, Bilateral upper extremity supported Sitting balance-Leahy Scale: Fair     Standing balance support: Reliant on assistive device for balance, Bilateral upper extremity supported Standing balance-Leahy Scale: Poor                             ADL either performed or assessed with clinical judgement  ADL Overall ADL's : Needs assistance/impaired                     Lower Body Dressing: Maximal assistance;Bed level Lower Body Dressing Details (indicate cue  type and reason): Max A to don socks at bed level Toilet Transfer: Minimal assistance;+2 for physical assistance;Stand-pivot;Cueing for sequencing;Cueing for safety Toilet Transfer Details (indicate cue type and reason): simulated with stand pivot t/f from EOB > recliner, verbal/tactile cues required for sequencing and hand placement                 Vision   Additional Comments: unable to assess 2/2 alertness and following one step commands inconsistently     Perception     Praxis      Pertinent Vitals/Pain Pain Assessment Pain Assessment: No/denies pain     Hand Dominance     Extremity/Trunk Assessment Upper Extremity Assessment Upper Extremity Assessment: Generalized weakness;Difficult to assess due to impaired cognition (good grip strength bilaterally, shoulder flexion AROM ~100 deg)   Lower Extremity Assessment Lower Extremity Assessment: Generalized weakness;Difficult to assess due to impaired cognition (pt reported RLE felt "numb")       Communication Communication Communication: HOH   Cognition Arousal/Alertness: Lethargic Behavior During Therapy: Flat affect Overall Cognitive Status: History of cognitive impairments - at baseline Area of Impairment: Orientation, Attention, Memory, Following commands, Safety/judgement, Awareness, Problem solving                 Orientation Level: Disoriented to, Place, Time, Situation Current Attention Level: Selective Memory: Decreased short-term memory Following Commands: Follows one step commands inconsistently Safety/Judgement: Decreased awareness of safety, Decreased awareness of deficits Awareness: Intellectual Problem Solving: Slow processing, Decreased initiation, Requires verbal cues, Requires tactile cues, Difficulty sequencing General Comments: Pt with dementia at baseline. Oriented to self ([name], birth date), Kevin [name], and where he lives. Difficulty staying awake throughout evaluation requiring Max VC for  alertness.     General Comments  poor skin integrity R forearm (skin tears)    Exercises     Shoulder Instructions      Home Living Family/patient expects to be discharged to:: Assisted living                                 Additional Comments: Pt is confused and very HOH. Poor historian, From chart review, pt lives with wife in ALF.      Prior Functioning/Environment Prior Level of Function : Needs assist;Patient poor historian/Family not available             Mobility Comments: Pt able to report that he uses RW or rollator for functional mobility. ADLs Comments: Pt unable to report if wife helps with ADLs at home other than stating "I do pretty good." Per chart review, wife assists with grocery shopping and driving.        OT Problem List: Decreased strength;Decreased coordination;Decreased range of motion;Decreased cognition;Decreased activity tolerance;Decreased safety awareness;Impaired sensation;Decreased knowledge of use of DME or AE;Impaired balance (sitting and/or standing)      OT Treatment/Interventions: Self-care/ADL training;Therapeutic exercise;Therapeutic activities;Cognitive remediation/compensation;Visual/perceptual remediation/compensation;DME and/or AE instruction;Patient/family education;Balance training    OT Goals(Current goals can be found in the care plan section) Acute Rehab OT Goals Patient Stated Goal: unable to state OT Goal Formulation: Patient unable to participate in goal setting Time For Goal Achievement: 07/09/22 Potential to Achieve Goals: Good ADL Goals Pt Will Perform Grooming: with supervision;sitting  Pt Will Perform Upper Body Dressing: with supervision;sitting Pt Will Perform Lower Body Dressing: with supervision;sitting/lateral leans;sit to/from stand Pt Will Transfer to Toilet: with supervision;bedside commode;stand pivot transfer Pt Will Perform Toileting - Clothing Manipulation and hygiene: with supervision;sit  to/from stand  OT Frequency: Min 2X/week    Co-evaluation PT/OT/SLP Co-Evaluation/Treatment: Yes Reason for Co-Treatment: Necessary to address cognition/behavior during functional activity;For patient/therapist safety;To address functional/ADL transfers;Complexity of the patient's impairments (multi-system involvement) PT goals addressed during session: Mobility/safety with mobility;Balance;Proper use of DME OT goals addressed during session: ADL's and self-care;Proper use of Adaptive equipment and DME      AM-PAC OT "6 Clicks" Daily Activity     Outcome Measure Help from another person eating meals?: A Little Help from another person taking care of personal grooming?: A Little Help from another person toileting, which includes using toliet, bedpan, or urinal?: A Lot Help from another person bathing (including washing, rinsing, drying)?: A Lot Help from another person to put on and taking off regular upper body clothing?: A Little Help from another person to put on and taking off regular lower body clothing?: A Lot 6 Click Score: 15   End of Session Equipment Utilized During Treatment: Gait belt;Rolling walker (2 wheels) Nurse Communication: Mobility status  Activity Tolerance: Patient limited by lethargy;Patient tolerated treatment well Patient left: in chair;with call bell/phone within reach;with chair alarm set;with nursing/sitter in room  OT Visit Diagnosis: Other abnormalities of gait and mobility (R26.89);History of falling (Z91.81);Cognitive communication deficit (R41.841);Muscle weakness (generalized) (M62.81) Symptoms and signs involving cognitive functions:  (Small acute infarct in the posterior limb of the left internal  capsule)                Time: 9381-8299 OT Time Calculation (min): 19 min Charges:  OT General Charges $OT Visit: 1 Visit OT Evaluation $OT Eval Low Complexity: 1 Low  [facility] MS, OTR/L ascom [phone]  06/25/22, 10:02 AM

## 2022-06-25 NOTE — Progress Notes (Addendum)
SLP Cancellation Note  Patient Details Name: Kevin [name] MRN: [id number] DOB: 1933-04-20   Cancelled treatment:       Reason Eval/Treat Not Completed: SLP screened, no needs identified, will sign off (chart reviewed) Pt is not appropriate for a Cognitive-linguistic evaluation at this time in setting of severely declined behavior including agitation, hitting/kicking/grabbing, and being nonverbal -- pt has a Baseline of Dementia, Parkinson's Dis. which may be exacerbated in setting of new illness/placement. Pt is requiring sedating medications. See NSG notes.  Recommend f/u for any such cognitive-linguistic evaluation at next venue of care when pt's behaviors have calmed and he can participate in such an evaluation. NSG updated.      Orinda Kenner, [place], [place] Speech Language Pathologist Rehab Services; [place] [phone] (ascom) Harlen Danford 06/25/2022, 4:08 PM

## 2022-06-25 NOTE — Evaluation (Addendum)
Physical Therapy Evaluation/Co-Eval with OT Patient Details Name: Kevin [name] MRN: [id number] DOB: [birth year] Today's Date: 06/25/2022  History of Present Illness  Kevin [name] is a 88 y.o. male with medical history significant for renal cancer status post nephrectomy, Parkinson's disease, dementia related to Parkinson's disease, hypertension who was brought into the ER by EMS for evaluation of weakness. MRI brain: "small acute infarct in the posterior limb of the left internal capsule."  Clinical Impression  88 yo Male came to ED with new onset weakness and inability to walk. He was diagnosed with small acute infarct in posterior limb of the left internal capsule  on MRI. Patient has a PMH significant for Parkinson's disease and dementia related to Parkinson's. He was lethargic during PT evaluation requiring multiple verbal cues for participation. Therefore Co-eval with OT was performed. Pt had difficulty following commands for MMT of LE, but was able to lift LE off bed indicating 3/5 strength. He was min A +1 for bed mobility. He was able to transfer bed to chair with RW, min A +2 requiring increased cues for hand placement/positioning for safety. Patient was oriented to self but not oriented to date, place and situation. He does have sitter in his room for safety. Patient would benefit from additional skilled PT intervention to improve strength, balance and gait safety; Recommend SNF rehab upon discharge to improve mobility and safety. Per chart review patient was living at ALF with his wife. However no one was present during PT/OT evaluation and therefore home situation is hard to confirm.        Recommendations for follow up therapy are one component of a multi-disciplinary discharge planning process, led by the attending physician.  Recommendations may be updated based on patient status, additional functional criteria and insurance authorization.  Follow Up Recommendations Skilled nursing-short  term rehab (<3 hours/day) Can patient physically be transported by private vehicle: Yes    Assistance Recommended at Discharge Frequent or constant Supervision/Assistance  Patient can return home with the following  A lot of help with walking and/or transfers;A lot of help with bathing/dressing/bathroom;Assistance with cooking/housework;Direct supervision/assist for medications management;Direct supervision/assist for financial management;Assist for transportation;Help with stairs or ramp for entrance    Equipment Recommendations None recommended by PT  Recommendations for Other Services       Functional Status Assessment Patient has had a recent decline in their functional status and/or demonstrates limited ability to make significant improvements in function in a reasonable and predictable amount of time     Precautions / Restrictions Precautions Precautions: Fall Restrictions Weight Bearing Restrictions: No      Mobility  Bed Mobility Overal bed mobility: Needs Assistance Bed Mobility: Supine to Sit     Supine to sit: HOB elevated, Min assist     General bed mobility comments: Min A x1 for supine to sit    Transfers Overall transfer level: Needs assistance Equipment used: Rolling walker (2 wheels) Transfers: Sit to/from Stand, Bed to chair/wheelchair/BSC Sit to Stand: Min assist Stand pivot transfers: Min assist, +2 physical assistance         General transfer comment: STS from EOB and stand pivot from EOB > recliner with Min A x2 + RW. Min A x1 for RW management    Ambulation/Gait               General Gait Details: Pt did take a few steps (2 steps) from bed to bedside chair with short shuffled steps; unable to walk longer distance  in room due to fatigue and weakness;  Stairs            Wheelchair Mobility    Modified Rankin (Stroke Patients Only)       Balance Overall balance assessment: Needs assistance Sitting-balance support: Feet  supported, Bilateral upper extremity supported Sitting balance-Leahy Scale: Fair     Standing balance support: Reliant on assistive device for balance, Bilateral upper extremity supported Standing balance-Leahy Scale: Poor Standing balance comment: required min A +2 with RW when transferred to bedside chair.                             Pertinent Vitals/Pain Pain Assessment Pain Assessment: No/denies pain    Home Living Family/patient expects to be discharged to:: Assisted living                   Additional Comments: Pt is confused and very HOH. Poor historian, From chart review, pt lives with wife in ALF.    Prior Function Prior Level of Function : Needs assist;Patient poor historian/Family not available             Mobility Comments: Pt able to report that he uses RW or rollator for functional mobility. ADLs Comments: Pt unable to report if wife helps with ADLs at home other than stating "I do pretty good." Per chart review, wife assists with grocery shopping and driving.     Hand Dominance        Extremity/Trunk Assessment   Upper Extremity Assessment Upper Extremity Assessment: Generalized weakness;Difficult to assess due to impaired cognition (good grip strength bilaterally, shoulder flexion AROM ~100 deg)    Lower Extremity Assessment Lower Extremity Assessment: Generalized weakness;RLE deficits/detail;LLE deficits/detail RLE Deficits / Details: Pt able to complete SLR with cues RLE Sensation: WNL LLE Deficits / Details: Pt able to complete SLR with cues LLE Sensation: WNL       Communication   Communication: HOH  Cognition Arousal/Alertness: Lethargic Behavior During Therapy: Flat affect Overall Cognitive Status: History of cognitive impairments - at baseline Area of Impairment: Orientation, Attention, Memory, Following commands, Safety/judgement, Awareness, Problem solving                 Orientation Level: Disoriented to,  Place, Time, Situation Current Attention Level: Selective Memory: Decreased short-term memory Following Commands: Follows one step commands inconsistently Safety/Judgement: Decreased awareness of safety, Decreased awareness of deficits Awareness: Intellectual Problem Solving: Slow processing, Decreased initiation, Requires verbal cues, Requires tactile cues, Difficulty sequencing General Comments: Pt with dementia at baseline. Oriented to self (name, birth date), wife's name, and where he lives. Difficulty staying awake throughout evaluation requiring Max VC for alertness.        General Comments General comments (skin integrity, edema, etc.): poor skin integrity R forearm (skin tears)    Exercises     Assessment/Plan    PT Assessment Patient needs continued PT services  PT Problem List Decreased strength;Decreased mobility;Decreased safety awareness;Decreased knowledge of precautions;Decreased activity tolerance;Decreased cognition;Decreased balance       PT Treatment Interventions DME instruction;Therapeutic exercise;Gait training;Balance training;Neuromuscular re-education;Functional mobility training;Therapeutic activities;Patient/family education;Cognitive remediation    PT Goals (Current goals can be found in the Care Plan section)  Acute Rehab PT Goals Patient Stated Goal: Pt unable to state goals due to cognitive decline PT Goal Formulation: With patient Time For Goal Achievement: 07/09/22 Potential to Achieve Goals: Fair    Frequency 7X/week     Co-evaluation  Reason for Co-Treatment: Necessary to address cognition/behavior during functional activity;For patient/therapist safety;To address functional/ADL transfers PT goals addressed during session: Mobility/safety with mobility;Balance;Proper use of DME OT goals addressed during session: ADL's and self-care;Proper use of Adaptive equipment and DME       AM-PAC PT "6 Clicks" Mobility  Outcome Measure Help  needed turning from your back to your side while in a flat bed without using bedrails?: A Little Help needed moving from lying on your back to sitting on the side of a flat bed without using bedrails?: A Little Help needed moving to and from a bed to a chair (including a wheelchair)?: A Little Help needed standing up from a chair using your arms (e.g., wheelchair or bedside chair)?: A Little Help needed to walk in hospital room?: A Lot Help needed climbing 3-5 steps with a railing? : Total 6 Click Score: 15    End of Session Equipment Utilized During Treatment: Gait belt Activity Tolerance: Patient tolerated treatment well;No increased pain Patient left: in chair;with call bell/phone within reach;with chair alarm set;with nursing/sitter in room Nurse Communication: Mobility status PT Visit Diagnosis: Unsteadiness on feet (R26.81);Repeated falls (R29.6);Muscle weakness (generalized) (M62.81);History of falling (Z91.81);Difficulty in walking, not elsewhere classified (R26.2)    Time: 9872-1587 PT Time Calculation (min) (ACUTE ONLY): 19 min   Charges:   PT Evaluation $PT Eval Low Complexity: 1 Low         Carmie Lanpher PT, DPT 06/25/2022, 1:03 PM

## 2022-06-25 NOTE — Progress Notes (Signed)
Called into patient's room by spouse due to patient removing telemetry leads, throwing legs over bed rail, kicking and attempting to remove PIV. Telemetry removed for now to prevent further agitation. PRN haldol given at spouse's request. Notified primary nurse Heather of above.

## 2022-06-25 NOTE — Consult Note (Addendum)
Neurology Consultation  Reason for Consult: stroke Referring Physician:  Dr Tawanna Solo  CC: right sided weakness on top of generalized weakness  History is obtained from: Chart, patient's wife  Kevin [name] at bedside  HPI: Kevin [name] is a 88 y.o. male past medical history of Parkinson's disease with associated dementia with behavioral disturbances, REM sleep disorder, renal cell cancer, asthma, hypertension, hyperlipidemia, presented to the emergency room with complaints of generalized weakness with worse weakness of the right leg of greater than 1 days duration at the time of presentation.  Wife also reported some speech issues-slurred speech. On emergency room evaluation, his examination was nonfocal but the wife and the patient had both complained that he had had trouble using his right arm and leg the day prior. He was admitted for further evaluation to the hospitalist with a broad differential.  Imaging was completed in the form of MRI of the brain that showed a small stroke in the internal capsule on the left. Neurology was consulted for the stroke. Patient's wife reports that overnight he has been extremely agitated.  He has not been getting his Sinemet because of lack of oral access and him not passing the swallowing screen.  She did not notice any focal deficits but reports him being grossly weak generally as well as being more tremulous and agitated than usual. Patient's RN reported that overnight he has been having trouble voiding and they had to in and out catheterize him overnight and were also getting ready to do this after my encounter with him.  Patient was also given Haldol less than 2 hours before this encounter was extremely drowsy and all information was obtained from the chart and his wife.    LKW: Sometime in the afternoon of 06/23/2022 IV thrombolysis given?: no, outside the window Premorbid modified Kevin scale (mRS): 3-4   ROS: Full ROS was performed and is negative except  as noted in the HPI.   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 He has never used smokeless tobacco. He reports that he does not drink alcohol and does not use drugs.  Medications  Current Facility-Administered Medications:     stroke: early stages of recovery book, , Does not apply, Once, Agbata, Tochukwu, MD   acetaminophen (TYLENOL) tablet 1,000 mg, 1,000 mg, Oral, Q8H PRN, Agbata, Tochukwu, MD   albuterol (PROVENTIL) (2.5 MG/3ML) 0.083% nebulizer solution 2.5 mg, 2.5 mg, Inhalation, Q6H PRN, Agbata, Tochukwu, MD   aspirin  EC tablet 81 mg, 81 mg, Oral, Daily, Agbata, Tochukwu, MD   atorvastatin (LIPITOR) tablet 20 mg, 20 mg, Oral, Daily, Agbata, Tochukwu, MD   carbidopa-levodopa (SINEMET IR) 25-100 MG per tablet immediate release 1 tablet, 1 tablet, Oral, Once, Arta Silence, MD   carbidopa-levodopa (SINEMET IR) 25-100 MG per tablet immediate release 1 tablet, 1 tablet, Oral, 2 times per day, Vira Blanco, RPH   carbidopa-levodopa (SINEMET IR) 25-100 MG per tablet immediate release 1.5 tablet, 1.5 tablet, Oral, TID, Vira Blanco, RPH   cyanocobalamin (VITAMIN B12) tablet 1,000 mcg, 1,000 mcg, Oral, Once per day on Mon Thu, Agbata, Tochukwu, MD    escitalopram (LEXAPRO) tablet 10 mg, 10 mg, Oral, Daily, Agbata, Tochukwu, MD   haloperidol lactate (HALDOL) injection 1 mg, 1 mg, Intravenous, Q6H PRN, Agbata, Tochukwu, MD, 1 mg at 06/25/22 0555   labetalol (NORMODYNE) injection 10 mg, 10 mg, Intravenous, Q6H PRN, Agbata, Tochukwu, MD   multivitamin-lutein (OCUVITE-LUTEIN) capsule 1 capsule, 1 capsule, Oral, Daily, Agbata, Tochukwu, MD   rivastigmine (EXELON) capsule 3 mg, 3 mg, Oral, BID WC, Agbata, Tochukwu, MD   traMADol (ULTRAM) tablet 50 mg, 50 mg, Oral, Q6H PRN, Agbata, Tochukwu, MD   Exam: Current vital signs: BP (!) 160/98 (BP Location: Right Arm)   Pulse 86   Temp 99.1 F (37.3 C)   Resp 17   Ht 6' (1.829 m)   Wt 71.4 kg   SpO2 98%   BMI 21.33 kg/m  Vital signs in last 24 hours: Temp:  [97.7 F (36.5 C)-99.2 F (37.3 C)] 99.1 F (37.3 C) (09/04 0740) Pulse Rate:  [70-102] 86 (09/04 0740) Resp:  [13-20] 17 (09/04 0740) BP: (148-182)/(74-132) 160/98 (09/04 0740) SpO2:  [97 %-100 %] 98 % (09/04 0740) Weight:  [71.4 kg] 71.4 kg (09/03 1053) General stuporous, does not open eyes to voice or following commands.  Received Haldol less than 2 hours ago. HEENT: Normocephalic atraumatic Lungs: Clear Cardiovascular: Regular rhythm Abdomen nontender nondistended Extremities with bruising in all 4 extremities Neurological exam He is extremely stuporous, does not open eyes to voice. He is breathing well and maintaining his airway at this time. Saturating well Forcibly closes eyes on attempts to check his pupils.  But pupils appear equal round react light. Face does not appear asymmetric He is moving all 4 extremities and withdraws all fours to noxious stimulation.  NIHSS-somewhat exaggerated due to Haldol - 19  Labs I have reviewed labs in epic and the results pertinent to this consultation are:  CBC    Component Value Date/Time   WBC 5.4 06/24/2022 1059   RBC 3.96 (L) 06/24/2022 1059   HGB 11.8 (L) 06/24/2022 1059    HCT 36.6 (L) 06/24/2022 1059   PLT 181 06/24/2022 1059   MCV 92.4 06/24/2022 1059   MCH 29.8 06/24/2022 1059   MCHC 32.2 06/24/2022 1059   RDW 14.3 06/24/2022 1059   LYMPHSABS 0.9 06/24/2022 1059   MONOABS 0.6 06/24/2022 1059   EOSABS 0.2 06/24/2022 1059   BASOSABS 0.0 06/24/2022 1059    CMP     Component Value Date/Time   NA 137 06/24/2022 1059   NA 134 12/17/2018 1351   K 3.8 06/24/2022 1059   CL 103 06/24/2022 1059   CO2 26 06/24/2022 1059   GLUCOSE 93 06/24/2022 1059   BUN 26 (H) 06/24/2022 1059   BUN 19 12/17/2018 1351   CREATININE 0.95 06/24/2022 1059   CALCIUM 8.9 06/24/2022 1059   PROT 7.1 06/24/2022 1059  ALBUMIN 4.2 06/24/2022 1059   AST 17 06/24/2022 1059   ALT <5 06/24/2022 1059   ALKPHOS 77 06/24/2022 1059   BILITOT 0.9 06/24/2022 1059   GFRNONAA >60 06/24/2022 1059   GFRAA 52 (L) 12/01/2019 1452    Lipid Panel     Component Value Date/Time   CHOL 152 06/25/2022 0431   TRIG 48 06/25/2022 0431   HDL 63 06/25/2022 0431   CHOLHDL 2.4 06/25/2022 0431   VLDL 10 06/25/2022 0431   LDLCALC 79 06/25/2022 0431     Imaging I have reviewed the images obtained:  MRI examination of the brain: Small acute infarct in the posterior limb of the left internal capsule.  Assessment: 88 year old with above past medical history presenting with generalized weakness and somewhat of worse right-sided weakness, outside the IV TNKase window. MRI with a small acute infarct in the posterior limb of the internal capsule-likely small vessel etiology Needs full stroke work-up.  Impression: Acute ischemic stroke-likely small vessel etiology History of Parkinson's and Parkinson's associated dementia.  Recommendations: Frequent neurochecks Telemetry Aspirin 81-would avoid dual antiplatelets due to fall risk Atorvastatin-20 at home, increased to 40 mg daily for goal LDL of less than 70.  His current LDL is 79. 2D echo CT angiography head and neck Permissive  hypertension-allow for blood pressure to be as high as systolic of 150 for another day or so and then start normalizing to normotension. History of Parkinson's: Unable to pass swallow screen-might need feeding tube for Sinemet administration. PT OT ST  Plan relayed to Dr. Tawanna Solo  -- Amie Portland, MD Neurologist [facility] Pager: [phone]

## 2022-06-25 NOTE — Plan of Care (Signed)
  Problem: Education: Goal: Knowledge of General Education information will improve Description: Including pain rating scale, medication(s)/side effects and non-pharmacologic comfort measures Outcome: Progressing   Problem: Health Behavior/Discharge Planning: Goal: Ability to manage health-related needs will improve Outcome: Progressing   Problem: Clinical Measurements: Goal: Ability to maintain clinical measurements within normal limits will improve Outcome: Progressing Goal: Will remain free from infection Outcome: Progressing Goal: Diagnostic test results will improve Outcome: Progressing Goal: Respiratory complications will improve Outcome: Progressing Goal: Cardiovascular complication will be avoided Outcome: Progressing   Problem: Activity: Goal: Risk for activity intolerance will decrease Outcome: Progressing   Problem: Nutrition: Goal: Adequate nutrition will be maintained Outcome: Progressing   Problem: Coping: Goal: Level of anxiety will decrease Outcome: Progressing   Problem: Elimination: Goal: Will not experience complications related to bowel motility Outcome: Progressing Goal: Will not experience complications related to urinary retention Outcome: Progressing   Problem: Pain Managment: Goal: General experience of comfort will improve Outcome: Progressing   Problem: Safety: Goal: Ability to remain free from injury will improve Outcome: Progressing   Problem: Skin Integrity: Goal: Risk for impaired skin integrity will decrease Outcome: Progressing   Problem: Education: Goal: Knowledge of disease or condition will improve Outcome: Progressing Goal: Knowledge of secondary prevention will improve (SELECT ALL) Outcome: Progressing Goal: Knowledge of patient specific risk factors will improve (INDIVIDUALIZE FOR PATIENT) Outcome: Progressing   Problem: Coping: Goal: Will verbalize positive feelings about self Outcome: Progressing   Problem: Health  Behavior/Discharge Planning: Goal: Ability to manage health-related needs will improve Outcome: Progressing   Problem: Self-Care: Goal: Ability to participate in self-care as condition permits will improve Outcome: Progressing Goal: Verbalization of feelings and concerns over difficulty with self-care will improve Outcome: Progressing Goal: Ability to communicate needs accurately will improve Outcome: Progressing   Problem: Nutrition: Goal: Dietary intake will improve Outcome: Progressing   Problem: Ischemic Stroke/TIA Tissue Perfusion: Goal: Complications of ischemic stroke/TIA will be minimized Outcome: Progressing

## 2022-06-25 NOTE — Evaluation (Addendum)
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0 ;  Surgeon: Birder Robson, MD;  Location: [place] [facility];  Service: Ophthalmology;  Laterality: Left;   CATARACT EXTRACTION W/PHACO Right 06/07/2020   Procedure: CATARACT EXTRACTION PHACO AND INTRAOCULAR LENS PLACEMENT ([facility]) RIGHT 11.56 00:54.9;  Surgeon: Birder Robson, MD;  Location: [place];  Service: Ophthalmology;  Laterality: Right;   COLONOSCOPY  2001, 2006, 2012   ESOPHAGOGASTRODUODENOSCOPY  09/11/2000   FLEXIBLE SIGMOIDOSCOPY  03/02/1997   KYPHOPLASTY N/A 11/13/2019   Procedure: T12 KYPHOPLASTY;  Surgeon: Hessie Knows, MD;  Location: [facility];  Service: Orthopedics;  Laterality: N/A;   KYPHOPLASTY N/A 12/03/2019   Procedure: L1 KYPHOPLASTY;  Surgeon: Hessie Knows, MD;  Location: [facility];  Service: Orthopedics;  Laterality: N/A;   KYPHOPLASTY N/A 02/28/2021   Procedure: T9 & T10 KYPHOPLASTY;   Surgeon: Hessie Knows, MD;  Location: [facility];  Service: Orthopedics;  Laterality: N/A;   NASAL POLYP EXCISION     NEPHRECTOMY Right 2020   ROBOT ASSISTED LAPAROSCOPIC NEPHRECTOMY Right 11/17/2018   Procedure: ROBOTIC ASSISTED LAPAROSCOPIC NEPHRECTOMY;  Surgeon: Hollice Espy, MD;  Location: [facility];  Service: Urology;  Laterality: Right;   XI ROBOTIC ASSISTED INGUINAL HERNIA REPAIR WITH MESH Left 12/06/2021   Procedure: XI ROBOTIC ASSISTED INGUINAL HERNIA REPAIR WITH MESH;  Surgeon: Herbert Pun, MD;  Location: [facility];  Service: General;  Laterality: Left;   HPI:  Pt  is a 88 y.o. male past medical history of Parkinson's disease with associated Dementia with Behavioral disturbances, REM sleep disorder, renal cell cancer, asthma, hypertension, hyperlipidemia, presented to the emergency room with complaints of generalized weakness with worse weakness of the right leg of greater than 1 days duration at the time of presentation.  Wife also reported some speech issues-slurred speech.  On emergency room evaluation, his examination was nonfocal but the wife and the patient had both complained that he had had trouble using his right arm and leg the day prior. He was admitted for further evaluation to the hospitalist with a broad differential.  Imaging was completed in the form of MRI of the brain that showed a small stroke in the Left internal capsule.  During this admit, patient has been severely confused removing telemetry leads, throwing legs over bed rail,  kicking and hitting and grabbing at staff and attempting to remove PIV. Telemetry removed for now to prevent further agitation.  Mitts placed to prevent pulling of IVs.  Haldol given multiple times.   During CT Angio of Head: Upper chest: Clear lung apices.    Assessment / Plan / Recommendation  Clinical Impression   Pt presents w/ severely declined Cognitive status w/ confusion and agitation requiring Bilat. Mitts, Safety Sitter, and  Medications to address agitation. Pt is at increased risk for aspiration w/ oral intake in current presentation/status. Pt appeared to be able to adequately tolerate small tsp trials of PUREE in order to swallow CRUSHED MEDS (medication for Parkinson's Dis specifically needed per MD to avoid need for NGT to deliver) w/ NO overt clinical s/s of aspiration during/post oral intake. NO OTHER po's were attempted d/t pt's current agitation and confusion; declined behavior which increases his risk for aspiration w/ any oral intake. Pt is also receiving sedating medications d/t his confusion/behaviors.  Discussed the above w/ MD and determined current POC would be that pt would remain NPO except for MEDS CRUSHED IN PUREE (as able to be crushed) and given in applesauce per NSG discretion at the time. Recommend oral care as pt allows. Strict aspiration precautions.  ST services will continue to follow pt's status w/ assessment of swallowing and trials to upgrade diet consistency as appropriate. NSG updated and agreed.   SLP Visit Diagnosis: Dysphagia, oropharyngeal phase (R13.12) (severe Dementia and confusion; agitation)    Aspiration Risk  Moderate aspiration risk;Risk for inadequate nutrition/hydration    Diet Recommendation   NPO except for applesauce w/ CRUSHED Pills w/ NSG. Aspiration precautions.   Medication Administration: Crushed with puree (applesaucwe)    Other  Recommendations Recommended Consults:  (Palliative Care f/u for [place]; Dietician) Oral Care Recommendations: Oral care QID;Oral care before and after PO;Staff/trained caregiver to provide oral care Other Recommendations:  (TBD)    Recommendations for follow up therapy are one component of a multi-disciplinary discharge planning process, led by the attending physician.  Recommendations may be updated based on patient status, additional functional criteria and insurance authorization.  Follow up Recommendations Skilled nursing-short term  rehab (<3 hours/day)      Assistance Recommended at Discharge Frequent or constant Supervision/Assistance  Functional Status Assessment Patient has had a recent decline in their functional status and/or demonstrates limited ability to make significant improvements in function in a reasonable and predictable amount of time  Frequency and Duration min 2x/week  2 weeks       Prognosis Prognosis for Safe Diet Advancement: Guarded Barriers to Reach Goals: Cognitive deficits;Language deficits;Time post onset;Severity of deficits;Behavior Barriers/Prognosis Comment: advanced Dementia; Parkinson's Dis      Swallow Study   General Date of Onset: 06/24/22 HPI: Pt  is a 88 y.o. male past medical history of Parkinson's disease with associated Dementia with Behavioral disturbances, REM sleep disorder, renal cell cancer, asthma, hypertension, hyperlipidemia, presented to the emergency room with complaints of generalized weakness with worse weakness of the right leg of greater than 1 days duration at the time of presentation.  Wife also reported some speech issues-slurred speech.  On emergency room evaluation, his examination was nonfocal but the wife and the patient had both complained that he had had trouble using his right arm and leg the day prior. He was admitted for further evaluation to the hospitalist with a broad differential.  Imaging was completed in the form of MRI of the brain that showed a small  stroke in the Left internal capsule.  During this admit, patient has been severely confused removing telemetry leads, throwing legs over bed rail, kicking and hitting and grabbing at staff and attempting to remove PIV. Telemetry removed for now to prevent further agitation.  Mitts placed to prevent pulling of IVs.  Haldol given multiple times.   During CT Angio of Head: Upper chest: Clear lung apices. Type of Study: Bedside Swallow Evaluation Previous Swallow Assessment: none Diet Prior to this Study:  NPO Temperature Spikes Noted:  (wbc 5.4) Respiratory Status: Room air History of Recent Intubation: No Behavior/Cognition: Cooperative;Confused;Impulsive;Lethargic/Drowsy;Requires cueing;Doesn't follow directions (eyes closed majority of time) Oral Cavity Assessment:  (CNT) Oral Care Completed by SLP: Recent completion by staff Oral Cavity - Dentition:  (appeared to have some?) Vision:  (n/a) Self-Feeding Abilities: Total assist Patient Positioning: Upright in chair (needed head positiong forward) Baseline Vocal Quality:  (nonverbal) Volitional Cough: Cognitively unable to elicit Volitional Swallow: Unable to elicit    Oral/Motor/Sensory Function Overall Oral Motor/Sensory Function:  (no unilateral oral weakness noted w/ bolus acceptance and management)   Ice Chips Ice chips: Not tested   Thin Liquid Thin Liquid: Not tested    Nectar Thick Nectar Thick Liquid: Not tested   Honey Thick Honey Thick Liquid: Not tested   Puree Puree: Impaired Presentation: Spoon (fed; ~4 ozs total; w/ meds crushed w/ NSG also) Oral Phase Impairments: Poor awareness of bolus Oral Phase Functional Implications:  (tactile cues at lips required) Pharyngeal Phase Impairments:  (none) Other Comments: nothing further assessed d/t confusion   Solid     Solid: Not tested         Orinda Kenner, MS, CCC-SLP Speech Language Pathologist Rehab Services; [place] [phone] (ascom) [place] 06/25/2022,4:01 PM

## 2022-06-25 NOTE — TOC Initial Note (Signed)
Transition of Care [facility]) - Initial/Assessment Note    Patient Details  Name: Kevin [name] MRN: [id number] Date of Birth: 16-Jun-1933  Transition of [facility]) CM/SW Contact:    Laurena Slimmer, RN Phone Number: 06/25/2022, 12:08 PM  Clinical Narrative:                 Wife not at bedside. Attempt to reach wife by phone to discuss discharge plan. No answer. Unable to leave a message. Labette PASSAR [phone] H.         Patient Goals and CMS Choice        Expected Discharge Plan and Services                                                Prior Living Arrangements/Services                       Activities of Daily Living Home Assistive Devices/Equipment: Hearing aid, Gilford Rile (specify type) ADL Screening (condition at time of admission) Patient's cognitive ability adequate to safely complete daily activities?: No Is the patient deaf or have difficulty hearing?: Yes Does the patient have difficulty seeing, even when wearing glasses/contacts?: No Does the patient have difficulty concentrating, remembering, or making decisions?: Yes Patient able to express need for assistance with ADLs?: No Does the patient have difficulty dressing or bathing?: Yes Independently performs ADLs?: No Communication: Needs assistance Is this a change from baseline?: Change from baseline, expected to last >3 days Dressing (OT): Dependent Is this a change from baseline?: Change from baseline, expected to last >3 days Grooming: Dependent Is this a change from baseline?: Change from baseline, expected to last >3 days Feeding: Needs assistance Is this a change from baseline?: Change from baseline, expected to last >3 days Bathing: Dependent Is this a change from baseline?: Change from baseline, expected to last >3 days Toileting: Dependent Is this a change from baseline?: Change from baseline, expected to last >3days In/Out Bed: Dependent Is this a change from baseline?: Change from  baseline, expected to last >3 days Walks in Home: Needs assistance Is this a change from baseline?: Change from baseline, expected to last >3 days Does the patient have difficulty walking or climbing stairs?: Yes Weakness of Legs: Both Weakness of Arms/Hands: None  Permission Sought/Granted                  Emotional Assessment              Admission diagnosis:  TIA (transient ischemic attack) [G45.9] Right leg weakness [R29.898] Generalized weakness [R53.1] CVA (cerebral vascular accident) [facility] [I63.9] Patient Active Problem List   Diagnosis Date Noted   CVA (cerebral vascular accident) ([place]) 06/25/2022   TIA (transient ischemic attack) 06/24/2022   Acute pancreatitis 01/23/2021   Hypokalemia 01/23/2021   Dementia ([place])    GERD (gastroesophageal reflux disease)    Hypertension    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[place])    Anemia 10/17/2020   Leukopenia 10/17/2020   Thrombocytopenia ([place]) 10/17/2020   CAP (community acquired pneumonia) 03/04/2019   Right renal mass 11/17/2018   PCP:  Baxter Hire, MD Pharmacy:   [id number] [id number]: [phone]: [phone] [facility] [address] [place] [address] Phone: [phone] Fax: [phone]     Social Determinants of Health (SDOH) Interventions    Readmission Risk Interventions     No data to display

## 2022-06-25 NOTE — Progress Notes (Signed)
Entered pts room for rounds and found pt attempting to get OOB. Legs were slung over the side rail. Sitter was attempting to get him back into bed at that time. I approached pt from the L side of the bed to help move pt to bed. Pt began attempting to kick sitter and trying to hit and grab this nurse. Called nurses station to ask for more assistance with pt. Two additional nurses came in to help. Mitts placed on pt and MD notified and made aware. '3mg'$  Haldol given per order.

## 2022-06-26 DIAGNOSIS — G20 Parkinson's disease: Secondary | ICD-10-CM

## 2022-06-26 DIAGNOSIS — G459 Transient cerebral ischemic attack, unspecified: Secondary | ICD-10-CM | POA: Diagnosis not present

## 2022-06-26 DIAGNOSIS — F0280 Dementia in other diseases classified elsewhere without behavioral disturbance: Secondary | ICD-10-CM

## 2022-06-26 DIAGNOSIS — I639 Cerebral infarction, unspecified: Secondary | ICD-10-CM | POA: Diagnosis not present

## 2022-06-26 LAB — GLUCOSE, CAPILLARY: Glucose-Capillary: 119 mg/dL — ABNORMAL HIGH (ref 70–99)

## 2022-06-26 MED ORDER — AMLODIPINE BESYLATE 10 MG PO TABS
10.0000 mg | ORAL_TABLET | Freq: Every day | ORAL | Status: DC
  Administered 2022-06-26 – 2022-06-29 (×4): 10 mg via ORAL
  Filled 2022-06-26 (×4): qty 1

## 2022-06-26 MED ORDER — TAMSULOSIN HCL 0.4 MG PO CAPS
0.4000 mg | ORAL_CAPSULE | Freq: Every day | ORAL | Status: DC
  Administered 2022-06-26 – 2022-06-29 (×4): 0.4 mg via ORAL
  Filled 2022-06-26 (×4): qty 1

## 2022-06-26 NOTE — Progress Notes (Signed)
Physical Therapy Treatment Patient Details Name: Kevin [name] MRN: [id number] DOB: [birth year] Today's Date: 06/26/2022   History of Present Illness Kevin [name] is a 88 y.o. male with medical history significant for renal cancer status post nephrectomy, Parkinson's disease, dementia related to Parkinson's disease, hypertension who was brought into the ER by EMS for evaluation of weakness. MRI brain: "small acute infarct in the posterior limb of the left internal capsule."    PT Comments    Co-tx with OT.  1 unit billed each.  Pt in bed.  He is assisted to EOB with min a x 2.  Initially unsteady in sitting but does progress to min guard at end of session.  He attempts standing with heavy mod a x 2.  Heavy R post lean in standing.  Remains up briefly first attempt, but on second he is able to take 1 sidestep to right with heavy assist.  Not functional step.  He does remains sitting at EOB for about 18 minutes total during activity.  He is able to brush teeth with OT and does A/AAROM BUE and LE with occasional verbal and tactile cues to remain upright.  He does fatigue with session and is assisted back to bed with min a x 2 and positioned for comfort.  Mitts re-applied after session and safety precautions in place.  Checked with RN who prefers pt stay in bed today due to cognition and trying to get up from bed.  Pt was found with legs between rails and laying sideways in bed just prior to arrival.   Recommendations for follow up therapy are one component of a multi-disciplinary discharge planning process, led by the attending physician.  Recommendations may be updated based on patient status, additional functional criteria and insurance authorization.  Follow Up Recommendations  Skilled nursing-short term rehab (<3 hours/day)     Assistance Recommended at Discharge Frequent or constant Supervision/Assistance  Patient can return home with the following Assistance with cooking/housework;Direct  supervision/assist for medications management;Direct supervision/assist for financial management;Assist for transportation;Help with stairs or ramp for entrance;Two people to help with walking and/or transfers;Two people to help with bathing/dressing/bathroom   Equipment Recommendations  None recommended by PT    Recommendations for Other Services       Precautions / Restrictions Precautions Precautions: Fall Restrictions Weight Bearing Restrictions: No     Mobility  Bed Mobility Overal bed mobility: Needs Assistance Bed Mobility: Supine to Sit, Sit to Supine     Supine to sit: Mod assist, +2 for physical assistance, Min assist Sit to supine: +2 for physical assistance, Mod assist, Min assist   General bed mobility comments: varied assist levels during session.  seems more affected by lethargy/cognition today    Transfers Overall transfer level: Needs assistance Equipment used: Rolling walker (2 wheels) Transfers: Sit to/from Stand Sit to Stand: Mod assist, +2 physical assistance                Ambulation/Gait Ambulation/Gait assistance: Mod assist, +2 physical assistance Gait Distance (Feet): 1 Feet Assistive device: Rolling walker (2 wheels) Gait Pattern/deviations: Step-to pattern Gait velocity: decreased     General Gait Details: able to take 1 sidestep along bed to reposition with heavy post and righ lean   Stairs             Wheelchair Mobility    Modified Rankin (Stroke Patients Only)       Balance Overall balance assessment: Needs assistance Sitting-balance support: Feet supported Sitting balance-Leahy Scale:  Poor Sitting balance - Comments: needs mod assist initially but does progress with min gaurd at end of session.  unsafe to be left unattended   Standing balance support: Reliant on assistive device for balance, Bilateral upper extremity supported Standing balance-Leahy Scale: Poor Standing balance comment: heavy assist for  standing today +2                            Cognition Arousal/Alertness: Lethargic Behavior During Therapy: Flat affect Overall Cognitive Status: History of cognitive impairments - at baseline                                          Exercises Other Exercises Other Exercises: sitting x 18 minutes EOB with seated ADL's with OT and BUE and LE A/AAROM    General Comments        Pertinent Vitals/Pain Pain Assessment Pain Assessment: Faces Faces Pain Scale: Hurts a little bit Pain Location: neck Pain Descriptors / Indicators: Sore Pain Intervention(s): Limited activity within patient's tolerance, Monitored during session, Repositioned    Home Living                          Prior Function            PT Goals (current goals can now be found in the care plan section) Progress towards PT goals: Not progressing toward goals - comment    Frequency    7X/week      PT Plan Current plan remains appropriate    Co-evaluation              AM-PAC PT "6 Clicks" Mobility   Outcome Measure  Help needed turning from your back to your side while in a flat bed without using bedrails?: A Little Help needed moving from lying on your back to sitting on the side of a flat bed without using bedrails?: A Lot   Help needed standing up from a chair using your arms (e.g., wheelchair or bedside chair)?: A Lot Help needed to walk in hospital room?: Total Help needed climbing 3-5 steps with a railing? : Total 6 Click Score: 9    End of Session Equipment Utilized During Treatment: Gait belt Activity Tolerance: Patient limited by lethargy Patient left: in bed;with call bell/phone within reach;with bed alarm set Nurse Communication: Mobility status PT Visit Diagnosis: Unsteadiness on feet (R26.81);Repeated falls (R29.6);Muscle weakness (generalized) (M62.81);History of falling (Z91.81);Difficulty in walking, not elsewhere classified (R26.2)      Time: 7672-0947 PT Time Calculation (min) (ACUTE ONLY): 25 min  Charges:  $Therapeutic Exercise: 8-22 mins                   Chesley Noon, PTA 06/26/22, 12:10 PM

## 2022-06-26 NOTE — Progress Notes (Addendum)
Occupational Therapy Treatment Patient Details Name: Kevin [name] MRN: [id number] DOB: 10-Nov-1932 Today's Date: 06/26/2022   History of present illness Kevin [name] is a 88 y.o. male with medical history significant for renal cancer status post nephrectomy, Parkinson's disease, dementia related to Parkinson's disease, hypertension who was brought into the ER by EMS for evaluation of weakness. MRI brain: "small acute infarct in the posterior limb of the left internal capsule."   OT comments  Patient seen for OT/PT co-treatment. Pt demonstrated improvement with alertness this date, however, still inconsistent with following one step commands. Pt completed bed mobility with Min A x2, STS (2x) from EOB with Mod A x2. Pt unable to maintain upright standing position with first attempt. During second standing attempt, pt able to take one lateral step. Pt with heavy posterior lean in standing. Pt completed grooming tasks and BUE A/AAROM exercises while sitting EOB with Min guard. Pt left supine in bed with mitts re-applied at end of session and safety measures in place. D/C recommendation remains appropriate. OT will continue to follow acutely.    Recommendations for follow up therapy are one component of a multi-disciplinary discharge planning process, led by the attending physician.  Recommendations may be updated based on patient status, additional functional criteria and insurance authorization.    Follow Up Recommendations  Skilled nursing-short term rehab (<3 hours/day)    Assistance Recommended at Discharge Frequent or constant Supervision/Assistance  Patient can return home with the following  A lot of help with walking and/or transfers;A lot of help with bathing/dressing/bathroom;Direct supervision/assist for medications management;Direct supervision/assist for financial management;Assistance with cooking/housework;Assist for transportation;Help with stairs or ramp for entrance   Equipment  Recommendations  Other (comment) (defer to next venue of care)    Recommendations for Other Services      Precautions / Restrictions Precautions Precautions: Fall Restrictions Weight Bearing Restrictions: No       Mobility Bed Mobility Overal bed mobility: Needs Assistance Bed Mobility: Supine to Sit, Sit to Supine     Supine to sit: Mod assist, +2 for physical assistance, Min assist Sit to supine: +2 for physical assistance, Mod assist, Min assist        Transfers Overall transfer level: Needs assistance Equipment used: Rolling walker (2 wheels) Transfers: Sit to/from Stand Sit to Stand: Mod assist, +2 physical assistance                 Balance Overall balance assessment: Needs assistance Sitting-balance support: Feet supported Sitting balance-Leahy Scale: Poor Sitting balance - Comments: Min guard sitting balance, verbal cues to maintain upright posture   Standing balance support: Reliant on assistive device for balance, Bilateral upper extremity supported Standing balance-Leahy Scale: Poor Standing balance comment: heavy assist to stand                           ADL either performed or assessed with clinical judgement   ADL Overall ADL's : Needs assistance/impaired     Grooming: Wash/dry face;Sitting;Cueing for sequencing;Oral care;Moderate assistance;Minimal assistance Grooming Details (indicate cue type and reason): Min A for face washing, Mod A for oral care, multimodal cues for sequencing                             Functional mobility during ADLs: Moderate assistance;+2 for physical assistance;Rolling walker (2 wheels) (1 lateral step at EOB)      Extremity/Trunk Assessment Upper Extremity  Assessment Upper Extremity Assessment: Difficult to assess due to impaired cognition (undershooting noted during finger to nose testing when pt tried to touch his own nose, unable to reach toward therapist's finger)   Lower Extremity  Assessment Lower Extremity Assessment: Difficult to assess due to impaired cognition        Vision Baseline Vision/History: 1 Wears glasses Vision Assessment?: Vision impaired- to be further tested in functional context Additional Comments: difficulty tracking stimulus during testing, only able to track slightly to L side of visual field   Perception     Praxis      Cognition Arousal/Alertness: Lethargic Behavior During Therapy: Flat affect Overall Cognitive Status: History of cognitive impairments - at baseline Area of Impairment: Orientation, Attention, Memory, Following commands, Safety/judgement, Awareness, Problem solving                 Orientation Level: Disoriented to, Place, Time, Situation Current Attention Level: Selective Memory: Decreased short-term memory Following Commands: Follows one step commands inconsistently Safety/Judgement: Decreased awareness of safety, Decreased awareness of deficits Awareness: Intellectual Problem Solving: Slow processing, Decreased initiation, Requires verbal cues, Requires tactile cues, Difficulty sequencing          Exercises Other Exercises Other Exercises: sitting x 18 minutes EOB completing grooming tasks, BUE and LE A/AAROM    Shoulder Instructions       General Comments      Pertinent Vitals/ Pain       Pain Assessment Pain Assessment: Faces Faces Pain Scale: No hurt  Home Living                                          Prior Functioning/Environment              Frequency  Min 2X/week        Progress Toward Goals  OT Goals(current goals can now be found in the care plan section)  Progress towards OT goals: Progressing toward goals  Acute Rehab OT Goals Patient Stated Goal: unable to state OT Goal Formulation: Patient unable to participate in goal setting Time For Goal Achievement: 07/09/22 Potential to Achieve Goals: Good  Plan Discharge plan remains  appropriate;Frequency remains appropriate    Co-evaluation    PT/OT/SLP Co-Evaluation/Treatment: Yes Reason for Co-Treatment: For patient/therapist safety;To address functional/ADL transfers;Necessary to address cognition/behavior during functional activity PT goals addressed during session: Mobility/safety with mobility;Balance;Proper use of DME OT goals addressed during session: ADL's and self-care;Proper use of Adaptive equipment and DME      AM-PAC OT "6 Clicks" Daily Activity     Outcome Measure   Help from another person eating meals?: A Little Help from another person taking care of personal grooming?: A Little Help from another person toileting, which includes using toliet, bedpan, or urinal?: A Lot Help from another person bathing (including washing, rinsing, drying)?: A Lot Help from another person to put on and taking off regular upper body clothing?: A Little Help from another person to put on and taking off regular lower body clothing?: A Lot 6 Click Score: 15    End of Session Equipment Utilized During Treatment: Gait belt;Rolling walker (2 wheels)  OT Visit Diagnosis: Other abnormalities of gait and mobility (R26.89);History of falling (Z91.81);Cognitive communication deficit (R41.841);Muscle weakness (generalized) (M62.81) Symptoms and signs involving cognitive functions:  (Small acute infarct in the posterior limb of the left internal capsule)   Activity Tolerance  Patient limited by lethargy;Patient tolerated treatment well   Patient Left in bed;with call bell/phone within reach;with bed alarm set   Nurse Communication Mobility status        Time: 6294-7654 OT Time Calculation (min): 25 min  Charges: OT General Charges $OT Visit: 1 Visit OT Treatments $Self Care/Home Management : 8-22 mins   [facility] MS, OTR/L ascom [phone]  06/26/22, 2:07 PM

## 2022-06-26 NOTE — Progress Notes (Signed)
Speech Language Pathology Treatment: Dysphagia  Patient Details Name: Kevin [name] MRN: [id number] DOB: 05/02/1933 Today's Date: 06/26/2022 Time: 1005-1100 SLP Time Calculation (min) (ACUTE ONLY): 55 min  Assessment / Plan / Recommendation Clinical Impression  Pt presents w/ severely declined Cognitive status(Baseline Dementia) w/ confusion and agitation requiring Bilat. Mitts, Safety Sitter at times, and Sedating Medications to address agitation. Pt is at increased risk for aspiration w/ oral intake in current presentation/status. Cognitive decline can impact overall awareness/timing of swallow and safety during po tasks which increases risk for aspiration, choking.   NSG reported good toleration of the purees w/ Crushed Meds w/ strict aspiration precautions. In setting of pt's continued Cognitive decline(w/ the Baseline Dementia) and need for Sedating Medications d/t behaviors and discomfort, only trials of Nectar and Honey consistency liquids and purees were assessed at this eval -- thin liquids are deemed too risky at this time in pt's current presentation.   Pt consumed several trials of purees, Nectar and Honey consistency liquids via TSP. Overt clinical s/s of aspiration noted w/ Nectar liquids c/b immediate/delayed Coughing.  No overt s/s of aspiration were noted w/ the Honey consistency liquids; no decline in intermittent phonations; no cough, and no decline in respiratory status during/post trials. Oral phase was c/b delayed bolus management intermittently; blowing behaviors. He responded to tactile stim of spoon at lips to engage opening mouth to accept boluses. Fair bolus management and oral clearing of the boluses given overall. Confusion w/ oral care tasks noted.          In setting of baseline Dementia, Cognitive decline, w/ need for Sedating Medications, and his risk for aspiration, recommend initiation of the dysphagia level 1(PUREED foods moistened for ease of oral phase/mastication)  w/ HONEY consistency liquids; aspiration precautions; reduce Distractions during meals and engage pt w/ verbal/tactile/visual stim and cues during oral intake. MUST be awake and engaging w/ po tasks to be given po's. IF TOO drowsy or agitated, then do NOT give po's. Oral care. Pills Crushed in Puree for safer swallowing. Wife and MD/NSG updated.  ST services will monitor pt's status on this diet next 2-3 days. ST services recommends follow w/ Palliative Care for [place] and education re: impact of Cognitive decline/Dementia on swallowing. Precautions posted in room.  Discussed the above w/ MD who agreed.      HPI HPI: Pt  is a 88 y.o. male past medical history of Parkinson's disease with associated Dementia with Behavioral disturbances, REM sleep disorder, renal cell cancer, asthma, hypertension, hyperlipidemia, presented to the emergency room with complaints of generalized weakness with worse weakness of the right leg of greater than 1 days duration at the time of presentation.  Wife also reported some speech issues-slurred speech.  On emergency room evaluation, his examination was nonfocal but the wife and the patient had both complained that he had had trouble using his right arm and leg the day prior. He was admitted for further evaluation to the hospitalist with a broad differential.  Imaging was completed in the form of MRI of the brain that showed a small stroke in the Left internal capsule.  During this admit, patient has been severely confused removing telemetry leads, throwing legs over bed rail, kicking and hitting and grabbing at staff and attempting to remove PIV. Telemetry removed for now to prevent further agitation.  Mitts placed to prevent pulling of IVs.  Haldol given multiple times.   During CT Angio of Head: Upper chest: Clear lung apices.  SLP Plan  Continue with current plan of care (monitor status)      Recommendations for follow up therapy are one component of a  multi-disciplinary discharge planning process, led by the attending physician.  Recommendations may be updated based on patient status, additional functional criteria and insurance authorization.    Recommendations  Diet recommendations: Dysphagia 1 (puree);Honey-thick liquid Liquids provided via: Teaspoon Medication Administration: Crushed with puree (as able) Supervision: Staff to assist with self feeding;Full supervision/cueing for compensatory strategies Compensations: Minimize environmental distractions;Slow rate;Small sips/bites;Lingual sweep for clearance of pocketing;Follow solids with liquid Postural Changes and/or Swallow Maneuvers: Out of bed for meals;Seated upright 90 degrees;Upright 30-60 min after meal                General recommendations:  (Palliative Care consult for [place]; Dietician f/u) Oral Care Recommendations: Oral care BID;Oral care before and after PO;Staff/trained caregiver to provide oral care Follow Up Recommendations: Skilled nursing-short term rehab (<3 hours/day) (TBD) Assistance recommended at discharge: Frequent or constant Supervision/Assistance SLP Visit Diagnosis: Dysphagia, oropharyngeal phase (R13.12) (severe Dementia and confusion; agitation) Plan: Continue with current plan of care (monitor status)              Orinda Kenner, [place], [place]; [place] [phone] (ascom) Kevin [name]  06/26/2022, 1:01 PM

## 2022-06-26 NOTE — NC FL2 (Signed)
[place] LEVEL OF CARE SCREENING TOOL     IDENTIFICATION  Patient Name: Kevin [name] Birthdate: May 19, 1933 Sex: male Admission Date (Current Location): 06/24/2022  [facility] and [place] Number:  Engineering geologist and Address:  [facility], [address] [place], [place] [address]      Provider Number: [id number]  Attending Physician Name and Address:  Shelly Coss, MD  Relative Name and Phone Number:  [name] NFAO,[phone]    Current Level of Care: Hospital Recommended Level of Care: [place] Prior Approval Number:    Date Approved/Denied:   PASRR Number: [id number]  Discharge Plan: SNF    Current Diagnoses: Patient Active Problem List   Diagnosis Date Noted   CVA (cerebral vascular accident) ([place]) 06/25/2022   TIA (transient ischemic attack) 06/24/2022   Acute pancreatitis 01/23/2021   Hypokalemia 01/23/2021   Dementia ([place])    GERD (gastroesophageal reflux disease)    Hypertension    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[place])    Anemia 10/17/2020   Leukopenia 10/17/2020   Thrombocytopenia ([place]) 10/17/2020   CAP (community acquired pneumonia) 03/04/2019   Right renal mass 11/17/2018    Orientation RESPIRATION BLADDER Height & Weight     Self, Time  Normal External catheter Weight: 71.4 kg Height:  6' (182.9 cm)  BEHAVIORAL SYMPTOMS/MOOD NEUROLOGICAL BOWEL NUTRITION STATUS     (n/a)   Diet (DYS 1)  AMBULATORY STATUS COMMUNICATION OF NEEDS Skin   Limited Assist   Bruising, Other (Comment) (dry, flaky)                       Personal Care Assistance Level of Assistance  Bathing, Feeding, Dressing Bathing Assistance: Limited assistance Feeding assistance: Limited assistance Dressing Assistance: Limited assistance     Functional Limitations Info  Sight, Hearing, Speech Sight Info: Impaired Hearing Info: Impaired Speech Info: Impaired (Slurred)     [place]  PT (By licensed PT), OT (By licensed OT)     PT Frequency: 7xweekly OT Frequency: Min2xweekly            Contractures      Additional Factors Info  Code Status, Allergies Code Status Info: DNR Allergies Info: No Known Allergies           Current Medications (06/26/2022):  This is the current hospital active medication list Current Facility-Administered Medications  Medication Dose Route Frequency Provider Last Rate Last Admin   acetaminophen (TYLENOL) tablet 1,000 mg  1,000 mg Oral Q8H PRN Agbata, Tochukwu, MD       albuterol (PROVENTIL) (2.5 MG/3ML) 0.083% nebulizer solution 2.5 mg  2.5 mg Inhalation Q6H PRN Agbata, Tochukwu, MD       amLODipine (NORVASC) tablet 10 mg  10 mg Oral Daily Shelly Coss, MD   10 mg at 06/26/22 0945   aspirin chewable tablet 81 mg  81 mg Oral Daily Amie Portland, MD   81 mg at 06/26/22 4401   Or   aspirin suppository 300 mg  300 mg Rectal Daily Amie Portland, MD       atorvastatin (LIPITOR) tablet 20 mg  20 mg Oral Daily Agbata, Tochukwu, MD   20 mg at 06/26/22 1645   carbidopa-levodopa (SINEMET IR) 25-100 MG per tablet immediate release 1 tablet  1 tablet Oral Once Arta Silence, MD       carbidopa-levodopa (SINEMET IR) 25-100 MG per tablet immediate release 1 tablet  1 tablet Oral 2  times per day Vira Blanco, [place]   1 tablet at 06/26/22 2133   carbidopa-levodopa (SINEMET IR) 25-100 MG per tablet immediate release 1.5 tablet  1.5 tablet Oral TID Vira Blanco, RPH   1.5 tablet at 06/26/22 1645   cyanocobalamin (VITAMIN B12) tablet 1,000 mcg  1,000 mcg Oral Once per day on Mon Thu Agbata, Tochukwu, MD       escitalopram (LEXAPRO) tablet 10 mg  10 mg Oral Daily Agbata, Tochukwu, MD   10 mg at 06/26/22 4097   haloperidol lactate (HALDOL) injection 3 mg  3 mg Intravenous Q6H PRN Shelly Coss, MD   3 mg at 06/25/22 1351   labetalol (NORMODYNE) injection 10 mg  10 mg Intravenous Q6H PRN Agbata, Tochukwu,  MD       multivitamin-lutein (OCUVITE-LUTEIN) capsule 1 capsule  1 capsule Oral Daily Agbata, Tochukwu, MD       rivastigmine (EXELON) capsule 3 mg  3 mg Oral BID WC Agbata, Tochukwu, MD   3 mg at 06/26/22 1704   tamsulosin (FLOMAX) capsule 0.4 mg  0.4 mg Oral Daily Shelly Coss, MD   0.4 mg at 06/26/22 0945   traMADol (ULTRAM) tablet 50 mg  50 mg Oral Q6H PRN Agbata, Tochukwu, MD         Discharge Medications: Please see discharge summary for a list of discharge medications.  Relevant Imaging Results:  Relevant Lab Results:   Additional Information SS# [id number]  Laurena Slimmer, RN

## 2022-06-26 NOTE — Progress Notes (Signed)
PROGRESS NOTE  Kevin [name]  SEG:[id number] DOB: 1933/10/19 DOA: 06/24/2022 PCP: Baxter Hire, MD   Brief Narrative: Patient is a 88 year old male with history of Parkinson's disease, dementia with behavioral disturbances, renal cell carcinoma, hypertension, hyperlipidemia who presented to the emergency department with complaints of generalized weakness more on the right leg for 1 day, slurred speech.  Patient was admitted for the suspicion of  stroke.  MRI of the brain showed a small stroke in the internal capsule on the left.  Neurology consulted .  Stroke work-up completed.  PT/OT recommending skilled nursing facility on discharge.  Can be discharged whenever bed is available.  TOC following  Assessment & Plan:  Principal Problem:   TIA (transient ischemic attack) Active Problems:   GERD (gastroesophageal reflux disease)   Hypertension   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HCC)   CVA (cerebral vascular accident) ([place])  Acute ischemic stroke:Patient presented to the emergency department with complaints of generalized weakness more on the right leg for 1 day, slurred speech.   MRI of the brain showed a small stroke in the internal capsule on the left.  Neurology consulted .  Started stroke work-up.  CT angio head and neck did not show large vessel occlusion. LDL of 79.  Hemoglobin A1c of 5.3. Currently on Lipitor 40 mg daily, aspirin.  2D echo showed EF of 60 to 73%, grade 1 diastolic dysfunction, no intracardiac source of emboli. Neurology signed off.  We recommend to follow-up with neurology as an outpatient in 4 weeks. PT/OT recommended skilled nursing facility on dc.  Parkinson disease/dementia/altered mental status: Takes Sinemet, rivastigmine.  Currently confused.  Confusion is most likely secondary to multifactorial causes including delirium, underlying dementia, stroke. As per wife, he is usually alert and oriented and ambulatory with a  walker.  Hypertension: Currently mildly hypertensive.  We will resume home amlodipine  GERD: On PPI.  Hyperlipidemia: Takes Lipitor at home, dose doubled to 40 mg daily  Urinary retention: Patient was retaining urine multiple times.  Foley catheter placed.  Started on tamsulosin.  We will give him a voiding trial before discharge date.          DVT prophylaxis:SCD's Start: 06/24/22 1401     Code Status: DNR  Family Communication: Wife on phone on 9/5  Patient status: Inpatient  Patient is from : Home  Anticipated discharge to:  SNF  Estimated DC date: Whenever bed is available at SNF   Consultants: Neurology  Procedures: None yet  Antimicrobials:  Anti-infectives (From admission, onward)    None       Subjective: Seen and examined at the bedside this morning.  Not agitated like yesterday.  He was lying in bed.  Calm and cooperative but confused.  Obeys commands.  Looks weak, deconditioned  Objective: Vitals:   06/25/22 2300 06/26/22 0406 06/26/22 0739 06/26/22 1133  BP: (!) 173/102 (!) 140/89 (!) 178/97 130/81  Pulse: 90 85 86 86  Resp: '18 20 20 18  '$ Temp: 98 F (36.7 C) 98 F (36.7 C) 99.8 F (37.7 C) 98.6 F (37 C)  TempSrc:   Axillary   SpO2: 98% 98%  95%  Weight:      Height:        Intake/Output Summary (Last 24 hours) at 06/26/2022 1140 Last data filed at 06/26/2022 0350 Gross per 24 hour  Intake --  Output 300 ml  Net -300 ml   Filed Weights   06/24/22 1053  Weight: 71.4  kg    Examination:  General exam: Overall comfortable, not in distress, very deconditioned, weak HEENT: PERRL Respiratory system:  no wheezes or crackles  Cardiovascular system: S1 & S2 heard, RRR.  Gastrointestinal system: Abdomen is nondistended, soft and nontender. Central nervous system: Alert and awake, oriented to self, obeys commands Extremities: No edema, no clubbing ,no cyanosis Skin: No rashes, no ulcers,no icterus   GU: Foley   Data Reviewed: I have  personally reviewed following labs and imaging studies  CBC: Recent Labs  Lab 06/24/22 1059  WBC 5.4  NEUTROABS 3.5  HGB 11.8*  HCT 36.6*  MCV 92.4  PLT 937   Basic Metabolic Panel: Recent Labs  Lab 06/24/22 1059  NA 137  K 3.8  CL 103  CO2 26  GLUCOSE 93  BUN 26*  CREATININE 0.95  CALCIUM 8.9     No results found for this or any previous visit (from the past 240 hour(s)).   Radiology Studies: CT ANGIO HEAD NECK W WO CM  Result Date: 06/25/2022 CLINICAL DATA:  Stroke/TIA, determine embolic source. Acute left internal capsule infarct on MRI. EXAM: CT ANGIOGRAPHY HEAD AND NECK TECHNIQUE: Multidetector CT imaging of the head and neck was performed using the standard protocol during bolus administration of intravenous contrast. Multiplanar CT image reconstructions and MIPs were obtained to evaluate the vascular anatomy. Carotid stenosis measurements (when applicable) are obtained utilizing NASCET criteria, using the distal internal carotid diameter as the denominator. RADIATION DOSE REDUCTION: This exam was performed according to the departmental dose-optimization program which includes automated exposure control, adjustment of the mA and/or kV according to patient size and/or use of iterative reconstruction technique. CONTRAST:  4m OMNIPAQUE IOHEXOL 350 MG/ML SOLN COMPARISON:  Head MRI 06/24/2022. Cervical spine CT 06/12/2022. Thoracic spine MRI 02/23/2021. FINDINGS: CT HEAD FINDINGS Brain: Focal hypodensity in the posterior limb of the left internal capsule corresponds to the acute infarct on MRI. No new infarct, intracranial hemorrhage, mass, midline shift, or extra-axial fluid collection is identified. There is mild cerebral atrophy. Hypodensities in the cerebral white matter bilaterally are nonspecific but compatible with mild chronic small vessel ischemic disease. Vascular: Calcified atherosclerosis at the skull base. Skull: No fracture or suspicious osseous lesion.  Sinuses/Orbits: Small mucous retention cyst in the left maxillary sinus. Clear mastoid air cells. Bilateral cataract extraction. Other: None. Review of the MIP images confirms the above findings CTA NECK FINDINGS Aortic arch: Incomplete coverage of the aortic arch, with the origins of the brachiocephalic and left common carotid arteries not being imaged. Mild-to-moderate calcified plaque in the distal aspect of the arch. No significant stenosis of the left subclavian artery origin. Mild stenosis of the right subclavian artery origin due to calcified plaque. Right carotid system: Patent with a small amount of calcified plaque at the carotid bifurcation. No evidence of a significant stenosis or dissection. Left carotid system: Patent with a small amount of calcified plaque at the carotid bifurcation. No evidence of a significant stenosis or dissection. Vertebral arteries: The vertebral arteries are patent and codominant without evidence of a significant stenosis or dissection. Skeleton: Chronic T1, T4, and T5 compression fractures. Other neck: Right thyroid nodule which has been previously evaluated by thyroid ultrasound and biopsy. Upper chest: Clear lung apices. Review of the MIP images confirms the above findings CTA HEAD FINDINGS Anterior circulation: The internal carotid arteries are patent from skull base to carotid termini with mild atherosclerotic plaque bilaterally not resulting in significant stenosis. ACAs and MCAs are patent without evidence of  a proximal branch occlusion or significant proximal stenosis. No aneurysm is identified. Posterior circulation: Intracranial vertebral arteries are widely patent to the basilar. Patent PICA and SCA origins are seen bilaterally. The basilar artery is widely patent. There are moderate right and small left posterior communicating arteries. Both PCAs are patent without evidence of a significant proximal stenosis. No aneurysm is identified. Venous sinuses: As permitted  by contrast timing, patent. Anatomic variants: None. Review of the MIP images confirms the above findings IMPRESSION: 1. Mild atherosclerosis in the head and neck without a large vessel occlusion or flow limiting stenosis. 2. Aortic Atherosclerosis (ICD10-I70.0). Electronically Signed   By: Logan Bores M.D.   On: 06/25/2022 15:05   ECHOCARDIOGRAM COMPLETE  Result Date: 06/25/2022    ECHOCARDIOGRAM REPORT   Patient Name:   Kevin [name] Date of Exam: 06/24/2022 Medical Rec #:  [id number]    Height:       72.0 in Accession #:    [id number]   Weight:       157.3 lb Date of Birth:  February 06, 1933    BSA:          1.924 m Patient Age:    74 years     BP:           166/91 mmHg Patient Gender: M            HR:           101 bpm. Exam Location:  [facility] Procedure: 2D Echo Indications:     Stroke I63.9  History:         Patient has no prior history of Echocardiogram examinations.  Sonographer:     Kathlen Brunswick RDCS Referring Phys:  [id number] AGBATA Diagnosing Phys: Nelva Bush MD  Sonographer Comments: Technically difficult study due to poor echo windows. Image acquisition challenging due to uncooperative patient. IMPRESSIONS  1. Left ventricular ejection fraction, by estimation, is 60 to 65%. The left ventricle has normal function. The left ventricle has no regional wall motion abnormalities. Left ventricular diastolic parameters are consistent with Grade I diastolic dysfunction (impaired relaxation).  2. Right ventricular systolic function is normal. The right ventricular size is normal. Tricuspid regurgitation signal is inadequate for assessing PA pressure.  3. The mitral valve is grossly normal. No evidence of mitral valve regurgitation. No evidence of mitral stenosis.  4. The aortic valve is tricuspid. Aortic valve regurgitation is not visualized. No aortic stenosis is present. FINDINGS  Left Ventricle: Left ventricular ejection fraction, by estimation, is 60 to 65%. The left ventricle has normal function. The  left ventricle has no regional wall motion abnormalities. The left ventricular internal cavity size was normal in size. There is  borderline left ventricular hypertrophy. Left ventricular diastolic parameters are consistent with Grade I diastolic dysfunction (impaired relaxation). Right Ventricle: The right ventricular size is normal. No increase in right ventricular wall thickness. Right ventricular systolic function is normal. Tricuspid regurgitation signal is inadequate for assessing PA pressure. Left Atrium: Left atrial size was normal in size. Right Atrium: Right atrial size was normal in size. Pericardium: The pericardium was not well visualized. Presence of epicardial fat layer. Mitral Valve: The mitral valve is grossly normal. No evidence of mitral valve regurgitation. No evidence of mitral valve stenosis. Tricuspid Valve: The tricuspid valve is not well visualized. Tricuspid valve regurgitation is not demonstrated. Aortic Valve: The aortic valve is tricuspid. Aortic valve regurgitation is not visualized. No aortic stenosis is present. Aortic valve peak gradient measures 12.8  mmHg. Pulmonic Valve: The pulmonic valve was not well visualized. Pulmonic valve regurgitation is not visualized. No evidence of pulmonic stenosis. Aorta: The aortic root is normal in size and structure. Pulmonary Artery: The pulmonary artery is of normal size. Venous: The inferior vena cava was not well visualized. IAS/Shunts: The interatrial septum was not well visualized.  LEFT VENTRICLE PLAX 2D LVIDd:         3.86 cm   Diastology LVIDs:         2.66 cm   LV e' medial:    6.96 cm/s LV PW:         1.27 cm   LV E/e' medial:  13.4 LV IVS:        1.20 cm   LV e' lateral:   7.72 cm/s LVOT diam:     2.10 cm   LV E/e' lateral: 12.0 LV SV:         73 LV SV Index:   38 LVOT Area:     3.46 cm  RIGHT VENTRICLE RV Basal diam:  2.03 cm TAPSE (M-mode): 1.6 cm LEFT ATRIUM             Index        RIGHT ATRIUM          Index LA diam:        4.80 cm  2.50 cm/m   RA Area:     8.86 cm LA Vol (A2C):   20.3 ml 10.55 ml/m  RA Volume:   15.40 ml 8.00 ml/m LA Vol (A4C):   17.4 ml 9.04 ml/m LA Biplane Vol: 19.6 ml 10.19 ml/m  AORTIC VALVE AV Area (Vmax): 2.15 cm AV Vmax:        179.00 cm/s AV Peak Grad:   12.8 mmHg LVOT Vmax:      111.00 cm/s LVOT Vmean:     74.300 cm/s LVOT VTI:       0.210 m  AORTA Ao Root diam: 3.50 cm MITRAL VALVE MV Area (PHT): 9.03 cm     SHUNTS MV Decel Time: 84 msec      Systemic VTI:  0.21 m MV E velocity: 93.00 cm/s   Systemic Diam: 2.10 cm MV A velocity: 116.00 cm/s MV E/A ratio:  0.80 Harrell Gave End MD Electronically signed by Nelva Bush MD Signature Date/Time: 06/25/2022/8:15:35 AM    Final    MR BRAIN WO CONTRAST  Result Date: 06/24/2022 CLINICAL DATA:  Stroke suspected.  Right leg weakness. EXAM: MRI HEAD WITHOUT CONTRAST TECHNIQUE: Multiplanar, multiecho pulse sequences of the brain and surrounding structures were obtained without intravenous contrast. COMPARISON:  Head CT 06/24/2022 and MRI 04/01/2020 FINDINGS: Brain: There is a small acute infarct in the posterior limb of the left internal capsule. Small T2 hyperintensities elsewhere in the cerebral white matter bilaterally have minimally progressed from the prior MRI and are nonspecific but compatible with mild chronic small vessel ischemic disease. There is mild cerebral atrophy. Vascular: Major intracranial vascular flow voids are preserved. Skull and upper cervical spine: Unremarkable bone marrow signal. Sinuses/Orbits: Bilateral cataract extraction. Small mucous retention cyst in the left maxillary sinus. Clear mastoid air cells. Other: None. IMPRESSION: 1. Small acute infarct in the posterior limb of the left internal capsule. 2. Mild chronic small vessel ischemic disease. Electronically Signed   By: Logan Bores M.D.   On: 06/24/2022 16:00   CT Head Wo Contrast  Result Date: 06/24/2022 CLINICAL DATA:  Neuro deficit, acute, stroke suspected EXAM: CT HEAD WITHOUT  CONTRAST  TECHNIQUE: Contiguous axial images were obtained from the base of the skull through the vertex without intravenous contrast. RADIATION DOSE REDUCTION: This exam was performed according to the departmental dose-optimization program which includes automated exposure control, adjustment of the mA and/or kV according to patient size and/or use of iterative reconstruction technique. COMPARISON:  Head CT 06/12/2022 FINDINGS: Brain: No evidence of acute intracranial hemorrhage or abnormal extra-axial collection. Chronic basal ganglia lacunar infarcts. No new loss of gray-white matter differentiation.Patent basal cisterns. No concerning mass effect.The ventricles are unchanged in size.Scattered subcortical and periventricular white matter hypodensities, nonspecific but likely sequela of chronic small vessel ischemic disease.Mild cerebral atrophy Vascular: No hyperdense vessel.  Vascular calcifications. Skull: Negative for skull fracture. Chronic right nasal bone deformity. Sinuses/Orbits: Mild ethmoid air cell mucosal thickening. Orbits are unremarkable. Mastoid air cells are clear. Other: None. IMPRESSION: No acute intracranial abnormality. Unchanged mild sequela of chronic small vessel ischemic disease. Electronically Signed   By: Maurine Simmering M.D.   On: 06/24/2022 11:56    Scheduled Meds:  amLODipine  10 mg Oral Daily   aspirin  81 mg Oral Daily   Or   aspirin  300 mg Rectal Daily   atorvastatin  20 mg Oral Daily   carbidopa-levodopa  1 tablet Oral Once   carbidopa-levodopa  1 tablet Oral 2 times per day   carbidopa-levodopa  1.5 tablet Oral TID   cyanocobalamin  1,000 mcg Oral Once per day on Mon Thu   escitalopram  10 mg Oral Daily   multivitamin-lutein  1 capsule Oral Daily   rivastigmine  3 mg Oral BID WC   tamsulosin  0.4 mg Oral Daily   Continuous Infusions:   LOS: 1 day   Shelly Coss, MD [facility] P9/02/2022, 11:40 AM

## 2022-06-26 NOTE — Plan of Care (Signed)
Pt was restless at beginning of shift but really calmed down and rested well for remainder.  He also became more responsive and lucid in responses.  His speech was also clearer.  It was important to wife that he get his sinemet.  Med was crushed in applesauce and pt managed well.

## 2022-06-26 NOTE — TOC Initial Note (Signed)
Transition of Care [facility]) - Initial/Assessment Note    Patient Details  Name: Kevin [name] MRN: [id number] Date of Birth: 09-10-1933  Transition of Care [facility]) CM/SW Contact:    Laurena Slimmer, RN Phone Number: 06/26/2022, 11:01 PM  Clinical Narrative:                 Spoke with patient's spouse at the bedside regarding his discharge plan, and SNF recommendation. Patient wife advised of SNF recommendation and is agreeable to SNF at [facility]. FL2 completed. Patient payor not located in [facility] portal. Will contact facility to begin auth.         Patient Goals and CMS Choice        Expected Discharge Plan and Services                                                Prior Living Arrangements/Services                       Activities of Daily Living Home Assistive Devices/Equipment: Hearing aid, Gilford Rile (specify type) ADL Screening (condition at time of admission) Patient's cognitive ability adequate to safely complete daily activities?: No Is the patient deaf or have difficulty hearing?: Yes Does the patient have difficulty seeing, even when wearing glasses/contacts?: No Does the patient have difficulty concentrating, remembering, or making decisions?: Yes Patient able to express need for assistance with ADLs?: No Does the patient have difficulty dressing or bathing?: Yes Independently performs ADLs?: No Communication: Needs assistance Is this a change from baseline?: Change from baseline, expected to last >3 days Dressing (OT): Dependent Is this a change from baseline?: Change from baseline, expected to last >3 days Grooming: Dependent Is this a change from baseline?: Change from baseline, expected to last >3 days Feeding: Needs assistance Is this a change from baseline?: Change from baseline, expected to last >3 days Bathing: Dependent Is this a change from baseline?: Change from baseline, expected to last >3 days Toileting: Dependent Is this a change  from baseline?: Change from baseline, expected to last >3days In/Out Bed: Dependent Is this a change from baseline?: Change from baseline, expected to last >3 days Walks in Home: Needs assistance Is this a change from baseline?: Change from baseline, expected to last >3 days Does the patient have difficulty walking or climbing stairs?: Yes Weakness of Legs: Both Weakness of Arms/Hands: None  Permission Sought/Granted                  Emotional Assessment              Admission diagnosis:  TIA (transient ischemic attack) [G45.9] Right leg weakness [R29.898] Generalized weakness [R53.1] CVA (cerebral vascular accident) [facility]) [I63.9] Patient Active Problem List   Diagnosis Date Noted   CVA (cerebral vascular accident) ([place]) 06/25/2022   TIA (transient ischemic attack) 06/24/2022   Acute pancreatitis 01/23/2021   Hypokalemia 01/23/2021   Dementia ([place])    GERD (gastroesophageal reflux disease)    Hypertension    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[place])    Anemia 10/17/2020   Leukopenia 10/17/2020   Thrombocytopenia ([place]) 10/17/2020   CAP (community acquired pneumonia) 03/04/2019   Right renal mass 11/17/2018   PCP:  Baxter Hire, MD Pharmacy:   [id number] [address]  [place] [address] Phone: [phone] Fax: [phone]  [facility] [place] [place] [facility] [employer] [address] [place] [address] Phone: [phone] Fax: [phone]     Social Determinants of Health (SDOH) Interventions    Readmission Risk Interventions     No data to display

## 2022-06-26 NOTE — Progress Notes (Signed)
Neurology Progress Note   S:// Remaining agitated overnight requiring Haldol. Able to take his Sinemet with applesauce crushed   O:// Current vital signs: BP (!) 178/97 (BP Location: Left Arm)   Pulse 86   Temp 99.8 F (37.7 C) (Axillary)   Resp 20   Ht 6' (1.829 m)   Wt 71.4 kg   SpO2 98%   BMI 21.33 kg/m  Vital signs in last 24 hours: Temp:  [97.7 F (36.5 C)-99.8 F (37.7 C)] 99.8 F (37.7 C) (09/05 0739) Pulse Rate:  [79-90] 86 (09/05 0739) Resp:  [16-20] 20 (09/05 0739) BP: (140-178)/(81-102) 178/97 (09/05 0739) SpO2:  [92 %-100 %] 98 % (09/05 0406) General: More awake today, able to follow simple commands HEENT: Normocephalic atraumatic Lungs: Clear Cardiovascular: Regular rhythm Abdomen nontender nondistended Neurological exam He is awake alert oriented to self. He is not able to tell me the month or the place He has somewhat of a vocal tremor/dysarthria as well. No gross evidence of aphasia Diminished attention concentration Able to follow simple commands Able to repeat Able to name simple objects inconsistently Cranial nerves: Pupils equal round react light, extraocular movements intact, visual fields full, face appears grossly symmetric, tongue and palate midline. Motor examination with antigravity right upper and left upper extremity.  Antigravity right lower and left lower extremity as well-not very cooperative with full exam Sensation intact light touch and to noxious stimulation as evidenced by strong withdrawal Coordination difficult to assess but he does have cogwheeling bilaterally at rest. NIH stroke scale 1a Level of Conscious.: 0 1b LOC Questions: 2 1c LOC Commands: 0 2 Best Gaze: 0 3 Visual: 0 4 Facial Palsy: 0 5a Motor Arm - left: 0 5b Motor Arm - Right: 0 6a Motor Leg - Left: 1 6b Motor Leg - Right: 1 7 Limb Ataxia: 0 8 Sensory: 0 9 Best Language: 0 10 Dysarthria: 1 11 Extinct. and Inatten.: 0 TOTAL: 5   Medications  Current  Facility-Administered Medications:    acetaminophen (TYLENOL) tablet 1,000 mg, 1,000 mg, Oral, Q8H PRN, Agbata, Tochukwu, MD   albuterol (PROVENTIL) (2.5 MG/3ML) 0.083% nebulizer solution 2.5 mg, 2.5 mg, Inhalation, Q6H PRN, Agbata, Tochukwu, MD   aspirin chewable tablet 81 mg, 81 mg, Oral, Daily, 81 mg at 06/26/22 0818 **OR** aspirin suppository 300 mg, 300 mg, Rectal, Daily, Amie Portland, MD   atorvastatin (LIPITOR) tablet 20 mg, 20 mg, Oral, Daily, Agbata, Tochukwu, MD   carbidopa-levodopa (SINEMET IR) 25-100 MG per tablet immediate release 1 tablet, 1 tablet, Oral, Once, Arta Silence, MD   carbidopa-levodopa (SINEMET IR) 25-100 MG per tablet immediate release 1 tablet, 1 tablet, Oral, 2 times per day, Vira Blanco, RPH, 1 tablet at 06/26/22 0319   carbidopa-levodopa (SINEMET IR) 25-100 MG per tablet immediate release 1.5 tablet, 1.5 tablet, Oral, TID, Vira Blanco, RPH, 1.5 tablet at 06/26/22 9604   cyanocobalamin (VITAMIN B12) tablet 1,000 mcg, 1,000 mcg, Oral, Once per day on Mon Thu, Agbata, Tochukwu, MD   escitalopram (LEXAPRO) tablet 10 mg, 10 mg, Oral, Daily, Agbata, Tochukwu, MD, 10 mg at 06/26/22 0817   haloperidol lactate (HALDOL) injection 3 mg, 3 mg, Intravenous, Q6H PRN, Tawanna Solo, Amrit, MD, 3 mg at 06/25/22 1351   labetalol (NORMODYNE) injection 10 mg, 10 mg, Intravenous, Q6H PRN, Agbata, Tochukwu, MD   multivitamin-lutein (OCUVITE-LUTEIN) capsule 1 capsule, 1 capsule, Oral, Daily, Agbata, Tochukwu, MD   rivastigmine (EXELON) capsule 3 mg, 3 mg, Oral, BID WC, Agbata, Tochukwu, MD, 3 mg at 06/26/22  0818   traMADol (ULTRAM) tablet 50 mg, 50 mg, Oral, Q6H PRN, Agbata, Tochukwu, MD Labs CBC    Component Value Date/Time   WBC 5.4 06/24/2022 1059   RBC 3.96 (L) 06/24/2022 1059   HGB 11.8 (L) 06/24/2022 1059   HCT 36.6 (L) 06/24/2022 1059   PLT 181 06/24/2022 1059   MCV 92.4 06/24/2022 1059   MCH 29.8 06/24/2022 1059   MCHC 32.2 06/24/2022 1059   RDW 14.3 06/24/2022  1059   LYMPHSABS 0.9 06/24/2022 1059   MONOABS 0.6 06/24/2022 1059   EOSABS 0.2 06/24/2022 1059   BASOSABS 0.0 06/24/2022 1059    CMP     Component Value Date/Time   NA 137 06/24/2022 1059   NA 134 12/17/2018 1351   K 3.8 06/24/2022 1059   CL 103 06/24/2022 1059   CO2 26 06/24/2022 1059   GLUCOSE 93 06/24/2022 1059   BUN 26 (H) 06/24/2022 1059   BUN 19 12/17/2018 1351   CREATININE 0.95 06/24/2022 1059   CALCIUM 8.9 06/24/2022 1059   PROT 7.1 06/24/2022 1059   ALBUMIN 4.2 06/24/2022 1059   AST 17 06/24/2022 1059   ALT <5 06/24/2022 1059   ALKPHOS 77 06/24/2022 1059   BILITOT 0.9 06/24/2022 1059   GFRNONAA >60 06/24/2022 1059   GFRAA 52 (L) 12/01/2019 1452    glycosylated hemoglobin 5.3 LDL 79  Lipid Panel     Component Value Date/Time   CHOL 152 06/25/2022 0431   TRIG 48 06/25/2022 0431   HDL 63 06/25/2022 0431   CHOLHDL 2.4 06/25/2022 0431   VLDL 10 06/25/2022 0431   LDLCALC 79 06/25/2022 0431     Imaging I have reviewed images in epic and the results pertinent to this consultation are: MRI examination of the brain: Small acute infarct in the posterior limb of the left internal capsule.  CT angiography head and neck: Mild atherosclerosis in the head and neck without a large vessel occlusion or flow-limiting stenosis.  Aortic atherosclerosis noted.  2D echo: LVEF 60 to 65%, no wall motion abnormalities.  Grade 1 diastolic dysfunction.  RV systolic function normal.  Mitral valve grossly normal.  Aortic valve without regurgitation.  No aortic stenosis seen.  Left atrium normal in size.  Interatrial septum not well visualized  Assessment:  88 year old man with history of parkinsonism amongst other comorbidities, presenting with complaints of right-sided weakness and noted to have a small acute infarct in the posterior limb of the left internal capsule-likely small vessel etiology. No emergent LVO on vessel imaging.  Presented outside the window for IV  thrombolysis.   Impression: Acute ischemic stroke-likely small vessel etiology, history of Parkinson's and Parkinson's associated dementia.  Recommendations: -Continue aspirin 81-would avoid dual antiplatelets due to fall risk -Atorvastatin 40 for goal LDL of less than 70. -Blood pressure goal now: Normotension -History of Parkinson's: Continue home dose of Sinemet-taking it as crushed pills with applesauce-avoid discontinuity in medications and use a feeding tube as needed if congruent with goals of care. -For delirium associated with dementia in a patient with Parkinson's, can use standing doses of Seroquel at night if needed. -PT, OT, speech therapy -Outpatient follow-up with neurology in 4 to 6 weeks after discharge  Plan relayed to Dr. Tawanna Solo.  Inpatient neurology will be available as needed.   -- Amie Portland, MD Neurologist [facility] Pager: [phone]

## 2022-06-27 ENCOUNTER — Encounter: Payer: Self-pay | Admitting: Internal Medicine

## 2022-06-27 DIAGNOSIS — Z7189 Other specified counseling: Secondary | ICD-10-CM

## 2022-06-27 DIAGNOSIS — R338 Other retention of urine: Secondary | ICD-10-CM

## 2022-06-27 DIAGNOSIS — G459 Transient cerebral ischemic attack, unspecified: Secondary | ICD-10-CM | POA: Diagnosis not present

## 2022-06-27 DIAGNOSIS — Z515 Encounter for palliative care: Secondary | ICD-10-CM | POA: Diagnosis not present

## 2022-06-27 DIAGNOSIS — I639 Cerebral infarction, unspecified: Secondary | ICD-10-CM | POA: Diagnosis not present

## 2022-06-27 DIAGNOSIS — G20 Parkinson's disease: Secondary | ICD-10-CM | POA: Diagnosis not present

## 2022-06-27 DIAGNOSIS — R34 Anuria and oliguria: Secondary | ICD-10-CM | POA: Clinically undetermined

## 2022-06-27 HISTORY — DX: Other retention of urine: R33.8

## 2022-06-27 LAB — COMPREHENSIVE METABOLIC PANEL
ALT: <5 U/L (ref 0–44)
AST: 18 U/L (ref 15–41)
Albumin: 3.3 g/dL — ABNORMAL LOW (ref 3.5–5.0)
Alkaline Phosphatase: 62 U/L (ref 38–126)
Anion gap: 7 (ref 5–15)
BUN: 27 mg/dL — ABNORMAL HIGH (ref 8–23)
CO2: 24 mmol/L (ref 22–32)
Calcium: 8.3 mg/dL — ABNORMAL LOW (ref 8.9–10.3)
Chloride: 111 mmol/L (ref 98–111)
Creatinine, Ser: 0.79 mg/dL (ref 0.61–1.24)
GFR, Estimated: >60 mL/min (ref >60)
Glucose, Bld: 120 mg/dL — ABNORMAL HIGH (ref 70–99)
Potassium: 2.8 mmol/L — ABNORMAL LOW (ref 3.5–5.1)
Sodium: 142 mmol/L (ref 135–145)
Total Bilirubin: 1.2 mg/dL (ref 0.3–1.2)
Total Protein: 6 g/dL — ABNORMAL LOW (ref 6.5–8.1)

## 2022-06-27 LAB — MAGNESIUM: Magnesium: 2.1 mg/dL (ref 1.7–2.4)

## 2022-06-27 LAB — CBC
HCT: 31.5 % — ABNORMAL LOW (ref 39.0–52.0)
Hemoglobin: 10.3 g/dL — ABNORMAL LOW (ref 13.0–17.0)
MCH: 29.8 pg (ref 26.0–34.0)
MCHC: 32.7 g/dL (ref 30.0–36.0)
MCV: 91 fL (ref 80.0–100.0)
Platelets: 150 10*3/uL (ref 150–400)
RBC: 3.46 MIL/uL — ABNORMAL LOW (ref 4.22–5.81)
RDW: 14.3 % (ref 11.5–15.5)
WBC: 7 10*3/uL (ref 4.0–10.5)
nRBC: 0 % (ref 0.0–0.2)

## 2022-06-27 MED ORDER — SODIUM CHLORIDE 0.9 % IV SOLN: INTRAVENOUS | Status: DC

## 2022-06-27 MED ORDER — POTASSIUM CHLORIDE 10 MEQ/100ML IV SOLN
10.0000 meq | INTRAVENOUS | Status: AC
  Administered 2022-06-27 (×3): 10 meq via INTRAVENOUS
  Filled 2022-06-27 (×3): qty 100

## 2022-06-27 MED ORDER — POTASSIUM CHLORIDE 20 MEQ PO PACK
40.0000 meq | PACK | ORAL | Status: AC
Start: 2022-06-27 — End: 2022-06-27
  Administered 2022-06-27: 40 meq via ORAL
  Filled 2022-06-27: qty 2

## 2022-06-27 MED ORDER — CHLORHEXIDINE GLUCONATE CLOTH 2 % EX PADS
6.0000 | MEDICATED_PAD | Freq: Every day | CUTANEOUS | Status: DC
  Administered 2022-06-27 – 2022-06-28 (×2): 6 via TOPICAL

## 2022-06-27 NOTE — Progress Notes (Signed)
Physical Therapy Treatment Patient Details Name: Kevin [name] MRN: [id number] DOB: [birth year] Today's Date: 06/27/2022   History of Present Illness Kevin [name] is a 88 y.o. male with medical history significant for renal cancer status post nephrectomy, Parkinson's disease, dementia related to Parkinson's disease, hypertension who was brought into the ER by EMS for evaluation of weakness. MRI brain: "small acute infarct in the posterior limb of the left internal capsule."    PT Comments    Co-treated with OT for safety and functional mobility and transfer training.  Pt more alert today and willing to participate.  Pt progressed with decreased Assist with bed mobility ModA, transfers mod to min A +2 For safety, and ambulation with RW Min A +2 for safety.  Pt did not lean hard to R during mobility and was able to orient body to midline with min A and mod cues in sitting and standing.   Recommendations for follow up therapy are one component of a multi-disciplinary discharge planning process, led by the attending physician.  Recommendations may be updated based on patient status, additional functional criteria and insurance authorization.  Follow Up Recommendations  Skilled nursing-short term rehab (<3 hours/day) Can patient physically be transported by private vehicle: Yes   Assistance Recommended at Discharge Frequent or constant Supervision/Assistance  Patient can return home with the following Assistance with cooking/housework;Direct supervision/assist for medications management;Direct supervision/assist for financial management;Assist for transportation;Help with stairs or ramp for entrance;Two people to help with walking and/or transfers;Two people to help with bathing/dressing/bathroom   Equipment Recommendations  None recommended by PT    Recommendations for Other Services       Precautions / Restrictions Precautions Precautions: Fall Restrictions Weight Bearing Restrictions: No      Mobility  Bed Mobility Overal bed mobility: Needs Assistance Bed Mobility: Supine to Sit     Supine to sit: Mod assist Sit to supine: Mod assist   General bed mobility comments: Mod cues for sequence.    Transfers Overall transfer level: Needs assistance Equipment used: Rolling walker (2 wheels) Transfers: Sit to/from Stand Sit to Stand: +2 physical assistance, Mod assist Stand pivot transfers: Min assist, +2 physical assistance         General transfer comment: Min to Mod A with transfers, slight posterior lean in standing which pt was able to slowly correct with min A    Ambulation/Gait Ambulation/Gait assistance: +2 physical assistance, Min assist Gait Distance (Feet): 7 Feet Assistive device: Rolling walker (2 wheels) Gait Pattern/deviations: Step-to pattern Gait velocity: decreased     General Gait Details: side stepping along bed to the R then Left then turned to pivot to the right and amb. 7 feet.  +2 to manage recliner and IV pole for safety but pt engaged with little to no Right/Posterior lean,  flexed psoture.   Stairs             Wheelchair Mobility    Modified Rankin (Stroke Patients Only)       Balance Overall balance assessment: Needs assistance Sitting-balance support: Feet supported Sitting balance-Leahy Scale: Poor Sitting balance - Comments: Min guard sitting balance, verbal cues to maintain upright posture   Standing balance support: Reliant on assistive device for balance, Bilateral upper extremity supported Standing balance-Leahy Scale: Poor Standing balance comment: pt marched in place with min A. and RW.  Cognition Arousal/Alertness: Awake/alert Behavior During Therapy: Flat affect Overall Cognitive Status: History of cognitive impairments - at baseline Area of Impairment: Orientation, Attention, Memory, Following commands, Safety/judgement, Awareness, Problem solving                    Current Attention Level: Selective   Following Commands: Follows one step commands with increased time Safety/Judgement: Decreased awareness of safety, Decreased awareness of deficits Awareness: Intellectual Problem Solving: Slow processing, Decreased initiation, Requires verbal cues, Requires tactile cues, Difficulty sequencing General Comments: Pt with dementia at baseline. Oriented to self (name, birth date), wife's name, and where he lives. No difficulty staying awake throughout PT session today.        Exercises      General Comments        Pertinent Vitals/Pain Pain Assessment Faces Pain Scale: Hurts a little bit Pain Location: Left wrist by IV site Pain Descriptors / Indicators: Aching Pain Intervention(s): Monitored during session    Home Living                          Prior Function            PT Goals (current goals can now be found in the care plan section) Acute Rehab PT Goals PT Goal Formulation: With patient Time For Goal Achievement: 07/09/22 Potential to Achieve Goals: Fair Progress towards PT goals: Progressing toward goals    Frequency    7X/week      PT Plan Current plan remains appropriate    Co-evaluation PT/OT/SLP Co-Evaluation/Treatment: Yes Reason for Co-Treatment: For patient/therapist safety;To address functional/ADL transfers PT goals addressed during session: Mobility/safety with mobility        AM-PAC PT "6 Clicks" Mobility   Outcome Measure  Help needed turning from your back to your side while in a flat bed without using bedrails?: A Little Help needed moving from lying on your back to sitting on the side of a flat bed without using bedrails?: A Little Help needed moving to and from a bed to a chair (including a wheelchair)?: A Lot Help needed standing up from a chair using your arms (e.g., wheelchair or bedside chair)?: A Lot Help needed to walk in hospital room?: A Lot Help needed climbing 3-5 steps  with a railing? : Total 6 Click Score: 13    End of Session Equipment Utilized During Treatment: Gait belt Activity Tolerance: Patient tolerated treatment well Patient left: with call bell/phone within reach;with family/visitor present;in chair;with chair alarm set Nurse Communication: Mobility status PT Visit Diagnosis: Unsteadiness on feet (R26.81);Repeated falls (R29.6);Muscle weakness (generalized) (M62.81);History of falling (Z91.81);Difficulty in walking, not elsewhere classified (R26.2)     Time: 4782-9562 (co-treat with OT) PT Time Calculation (min) (ACUTE ONLY): 29 min  Charges:  $Therapeutic Activity: 8-22 mins                     Bjorn Loser, PTA  06/27/22, 12:04 PM

## 2022-06-27 NOTE — Progress Notes (Signed)
       CROSS COVER NOTE  NAME: Kevin [name] MRN: [id number] DOB : 25-Jun-1933    Date of Service   06/27/2022   HPI/Events of Note   Notified by nursing that [name] is lethargic and has poor urine output this shift.   On arrival to bedside [name] responds to voice and follows commands, witnessed by both RNs at bedside. RN reports he had ~10 mL of urine output. Urine in foley bag is dark with sediment.   [name] is currently on a Dysphagia diet with Honey Thick liquids and per nursing has had decreased fluid intake.   Interventions   Plan: Gentle Fluid Hydration CBC/CMP Requested nursing flush foley and bladder scan patient      This document was prepared using Dragon voice recognition software and may include unintentional dictation errors.  Neomia Glass DNP, MHA, FNP-BC Nurse Practitioner [facility] Pager [phone]

## 2022-06-27 NOTE — Assessment & Plan Note (Addendum)
Replaced on 9/6 for K 2.8 >> improved to  3.6 Mg is normal at 2.2 Oral replacement given today for K 3.4. Repeat BMP in 1 week and replace K as needed.

## 2022-06-27 NOTE — Assessment & Plan Note (Addendum)
Foley catheter was placed after patient required multiple in/out caths for urinary retention ranging from 400 cc to 1 L.  Foley removed 9/6 for voiding trial and appears no longer having retention. -- Monitor urine output -- Started on Flomax, continue -- Urology outpatient follow up as needed

## 2022-06-27 NOTE — Progress Notes (Signed)
[facility] Liaison Note  Notified by PMT/Tasha D., NP of patient/family request of [facility] Paliative services.  [facility] hospital liaison will follow patient for discharge disposition.   Please call with any questions/concerns.    Thank you for the opportunity to participate in this patient's care.   Daphene Calamity, MSW [facility] Liaison  [phone]

## 2022-06-27 NOTE — Plan of Care (Signed)
At beginning of shift, pt's wife was at bedside and he was much more responsive and alert.  Speech more clear - admitted he was hard of hearing.  As night progressed, he became much more lethargic and this nurse was unable to perform NIH.  Pt has foley catheter and is having little output, what urine there is is very dark.  He does have a hx of renal ca w/1 existing kidney.  Pt hasn't had any labs since admission.  Reached out to on call NP to see if she would order CBC/BMP to check kidney function.

## 2022-06-27 NOTE — TOC Progression Note (Signed)
Transition of Care [facility]) - Progression Note    Patient Details  Name: Kevin [name] MRN: [id number] Date of Birth: Oct 31, 1932  Transition of Care [facility]) CM/SW Contact  Laurena Slimmer, RN Phone Number: 06/27/2022, 10:37 AM  Clinical Narrative:    Message left for Leonette Nutting, [place] Worker to advised Josem Kaufmann would have to be started by facility.         Expected Discharge Plan and Services                                                 Social Determinants of Health (SDOH) Interventions    Readmission Risk Interventions     No data to display

## 2022-06-27 NOTE — Progress Notes (Signed)
Occupational Therapy Treatment Patient Details Name: Kevin [name] MRN: [id number] DOB: 26-Sep-1933 Today's Date: 06/27/2022   History of present illness Kevin [name] is a 88 y.o. male with medical history significant for renal cancer status post nephrectomy, Parkinson's disease, dementia related to Parkinson's disease, hypertension who was brought into the ER by EMS for evaluation of weakness. MRI brain: "small acute infarct in the posterior limb of the left internal capsule."   OT comments  Upon entering session pt resting in bed, pt and spouse agreeable to OT/PT co-treatment to maximize participation and safety. Pt completed bed mobility with Mod A, functional transfers with Min-Mod A x2, and functional mobility with Min A x2 using RW. Pt with significantly improved alertness this date, however, continues to require verbal/tactile cues for sequencing and safety during all activities. Pt left sitting in recliner with all needs in reach and mitts re-applied. Pt is making progress toward goal completion. D/C recommendation remains appropriate. OT will continue to follow acutely.    Recommendations for follow up therapy are one component of a multi-disciplinary discharge planning process, led by the attending physician.  Recommendations may be updated based on patient status, additional functional criteria and insurance authorization.    Follow Up Recommendations  Skilled nursing-short term rehab (<3 hours/day)    Assistance Recommended at Discharge Frequent or constant Supervision/Assistance  Patient can return home with the following  A lot of help with walking and/or transfers;A lot of help with bathing/dressing/bathroom;Direct supervision/assist for medications management;Direct supervision/assist for financial management;Assistance with cooking/housework;Assist for transportation;Help with stairs or ramp for entrance   Equipment Recommendations  Other (comment) (defer to next venue of care)     Recommendations for Other Services      Precautions / Restrictions Precautions Precautions: Fall Restrictions Weight Bearing Restrictions: No       Mobility Bed Mobility Overal bed mobility: Needs Assistance Bed Mobility: Supine to Sit     Supine to sit: Mod assist          Transfers Overall transfer level: Needs assistance Equipment used: Rolling walker (2 wheels) Transfers: Sit to/from Stand Sit to Stand: +2 physical assistance, Mod assist, Min assist           General transfer comment: Min to Mod A with transfers, slight posterior lean in standing which pt was able to slowly correct with min A     Balance Overall balance assessment: Needs assistance Sitting-balance support: Feet supported Sitting balance-Leahy Scale: Poor Sitting balance - Comments: Min guard sitting balance, verbal cues to maintain upright posture   Standing balance support: Reliant on assistive device for balance, Bilateral upper extremity supported Standing balance-Leahy Scale: Poor Standing balance comment: pt marched in place with min A x2 and RW                           ADL either performed or assessed with clinical judgement   ADL Overall ADL's : Needs assistance/impaired                     Lower Body Dressing: Maximal assistance;Bed level Lower Body Dressing Details (indicate cue type and reason): Max A to don socks at bed level Toilet Transfer: Moderate assistance;Minimal assistance;+2 for physical assistance Toilet Transfer Details (indicate cue type and reason): simulated with STS from EOB         Functional mobility during ADLs: Minimal assistance;+2 for safety/equipment;Rolling walker (2 wheels)      Extremity/Trunk  Assessment Upper Extremity Assessment Upper Extremity Assessment: Difficult to assess due to impaired cognition   Lower Extremity Assessment Lower Extremity Assessment: Difficult to assess due to impaired cognition        Vision  Baseline Vision/History: 1 Wears glasses     Perception     Praxis      Cognition Arousal/Alertness: Awake/alert Behavior During Therapy: Flat affect Overall Cognitive Status: History of cognitive impairments - at baseline Area of Impairment: Orientation, Attention, Memory, Following commands, Safety/judgement, Awareness, Problem solving                 Orientation Level: Disoriented to, Place, Time, Situation Current Attention Level: Sustained Memory: Decreased short-term memory Following Commands: Follows one step commands with increased time Safety/Judgement: Decreased awareness of safety, Decreased awareness of deficits Awareness: Intellectual Problem Solving: Slow processing, Decreased initiation, Requires verbal cues, Requires tactile cues, Difficulty sequencing General Comments: Pt with dementia at baseline. Oriented to self (name, birth date), wife's name, and where he lives. Improvements in alertness this date.        Exercises      Shoulder Instructions       General Comments      Pertinent Vitals/ Pain       Pain Assessment Pain Assessment: Faces Faces Pain Scale: Hurts a little bit Pain Location: Left wrist by IV site Pain Descriptors / Indicators: Aching Pain Intervention(s): Monitored during session  Home Living                                          Prior Functioning/Environment              Frequency  Min 2X/week        Progress Toward Goals  OT Goals(current goals can now be found in the care plan section)  Progress towards OT goals: Progressing toward goals  Acute Rehab OT Goals Patient Stated Goal: unable to state OT Goal Formulation: Patient unable to participate in goal setting Time For Goal Achievement: 07/09/22 Potential to Achieve Goals: Good  Plan Discharge plan remains appropriate;Frequency remains appropriate    Co-evaluation    PT/OT/SLP Co-Evaluation/Treatment: Yes Reason for Co-Treatment:  For patient/therapist safety;To address functional/ADL transfers;Necessary to address cognition/behavior during functional activity PT goals addressed during session: Mobility/safety with mobility;Balance OT goals addressed during session: ADL's and self-care;Proper use of Adaptive equipment and DME      AM-PAC OT "6 Clicks" Daily Activity     Outcome Measure   Help from another person eating meals?: A Little Help from another person taking care of personal grooming?: A Little Help from another person toileting, which includes using toliet, bedpan, or urinal?: A Lot Help from another person bathing (including washing, rinsing, drying)?: A Lot Help from another person to put on and taking off regular upper body clothing?: A Little Help from another person to put on and taking off regular lower body clothing?: A Lot 6 Click Score: 15    End of Session Equipment Utilized During Treatment: Gait belt;Rolling walker (2 wheels)  OT Visit Diagnosis: Other abnormalities of gait and mobility (R26.89);History of falling (Z91.81);Cognitive communication deficit (R41.841);Muscle weakness (generalized) (M62.81) Symptoms and signs involving cognitive functions:  (Small acute infarct in the posterior limb of the left internal capsule)   Activity Tolerance Patient tolerated treatment well   Patient Left in chair;with call bell/phone within reach;with chair alarm set;with nursing/sitter in  room;with family/visitor present   Nurse Communication Mobility status        Time: 6834-1962 OT Time Calculation (min): 29 min  Charges: OT General Charges $OT Visit: 1 Visit OT Treatments $Self Care/Home Management : 8-22 mins  [facility] MS, OTR/L ascom [phone]  06/27/22, 2:41 PM

## 2022-06-27 NOTE — Consult Note (Signed)
Consultation Note Date: 06/27/2022   Patient Name: Kevin [name]  DOB: 1933/05/26  MRN: [id number]  Age / Sex: 88 y.o., male  PCP: Baxter Hire, MD Referring Physician: Ezekiel Slocumb, DO  Reason for Consultation: Establishing goals of care  HPI/Patient Profile: 88 y.o. male  with past medical history of dementia related to Parkinson's disease with behavioral disturbance, renal cell cancer status post nephrectomy 2019, pancreatitis, E. coli bacteremia 2020, HTN/HLD, extreme hearing loss, GERD, kyphoplasty 2021 T9 and TN-T12-L1, anemia admitted on 06/24/2022 with TIA/acute ischemic stroke.   Clinical Assessment and Goals of Care: I have reviewed medical records including EPIC notes, labs and imaging, received report from RN, assessed the patient.  Kevin [name] is resting quietly in bed.  He is resting comfortably, but wakes easily when I call his name and touch his arm.  He has mitts on at a sitter present.  He has known Parkinson's dementia, but is able to tell me his name.  He has profound hearing loss.  He does not answer my other orientation questions.  He asks for water.  I offer him some honey thickened orange juice which she is able to take without overt signs of aspiration.  There is no family present at this time.  Kevin [name] tells me that he does not have children.  Call to wife, [name], to discuss diagnosis prognosis, [place], EOL wishes, disposition and options.  I introduced Palliative Medicine as specialized medical care for people living with serious illness. It focuses on providing relief from the symptoms and stress of a serious illness. The goal is to improve quality of life for both the patient and the family.  We discussed a brief life review of the patient.  Mr. and Mrs. [name] has been married for 63 years.  They had no children, but numerous nieces and nephews who are they are close with.  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  She is very knowledgeable about the medical system.  They have lived an independent living in an apartment at [facility] for 4 years.  [name] had been mostly independent, walking with a walker, prior to this acute illness  We then focused on their current illness.  We talk about Mr. Hanauer acute stroke and recommendation for rehab.  We talk about his dysphagia and diet.  Kevin [name] states that she understands that he will have an "long road".  The natural disease trajectory and expectations at EOL were discussed.  Advanced directives, concepts specific to code status, artifical feeding and hydration, and rehospitalization were considered and discussed.  Treat the treatable, but allowing natural passing/DNR  Palliative Care services outpatient were explained and offered.  We talked about the benefits of outpatient palliative services for continued goals of care discussion.  Kevin [name] is agreeable, provider of choice is [facility].  Provider updated.  Discussed the importance of continued conversation with family and the medical providers regarding overall plan of care and treatment  options, ensuring decisions are within the context of the patient's values and GOCs.  Questions and concerns were addressed.  The family was encouraged to call with questions or concerns.  PMT will continue to support holistically.  Conference with attending, bedside nursing staff, transition of care team related to patient condition, needs, goals of care, disposition.   HCPOA NEXT OF KIN -wife of 76 years, Kevin [name].  They have no children, but multiple nieces and nephews.    SUMMARY OF RECOMMENDATIONS   At this point continue to treat the treatable but no CPR or intubation Time for outcomes Short-term rehab at [facility] where they have been in independent living for 4 years Outpatient palliative services with [facility]   Code Status/Advance Care  Planning: DNR  Symptom Management:  Per hospitalist, no additional needs at this time.  Palliative Prophylaxis:  Frequent Pain Assessment and Oral Care  Additional Recommendations (Limitations, Scope, Preferences): Treat the treatable but no CPR or intubation  Psycho-social/Spiritual:  Desire for further Chaplaincy support:no Additional Recommendations: Caregiving  Support/Resources  Prognosis:  Unable to determine, based on outcomes.  6 months or less would not be surprising based on chronic illness burden, current stroke, poor by mouth intake with dysphagia.   Discharge Planning: Short-term rehab at [facility] with palliative      Primary Diagnoses: Present on Admission:  TIA (transient ischemic attack)  Hypertension  GERD (gastroesophageal reflux disease)  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HCC)  CVA (cerebral vascular accident) ([place])  Hypokalemia   I have reviewed the medical record, interviewed the patient and family, and examined the patient. The following aspects are pertinent.  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   Cataract cortical, senile    Chronic kidney disease    Colon polyps    Dementia (HCC)    Parkinson's, loss of short term memory   E coli infection 2020   blood stream   GERD (gastroesophageal reflux disease)    HOH (hard of hearing)    extremely HOH, wears aids   Hyperlipidemia    Hypertension    Osteoporosis    Pancreatitis    Pneumonia    AS A CHILD   Social History   Socioeconomic History   Marital status: Married    Spouse name: [name]    Number of children: 1   Years of education: Not on file   Highest education level: Not on file  Occupational History   Occupation: retired    Comment: Network engineer.  Tobacco Use   Smoking status: Never   Smokeless  tobacco: Never  Vaping Use   Vaping Use: Never used  Substance and Sexual Activity   Alcohol use: Never   Drug use: Never   Sexual activity: Not on file  Other Topics Concern   Not on file  Social History Narrative   Not on file   Social Determinants of Health   Financial Resource Strain: Low Risk  (07/22/2018)   Overall Financial Resource Strain (CARDIA)    Difficulty of Paying Living Expenses: Not very hard  Food Insecurity: No Food Insecurity (07/22/2018)   Hunger Vital Sign    Worried About Running Out of Food in the Last Year: Never true    Ran Out of Food  in the Last Year: Never true  Transportation Needs: No Transportation Needs (07/22/2018)   PRAPARE - Hydrologist (Medical): No    Lack of Transportation (Non-Medical): No  Physical Activity: Sufficiently Active (07/22/2018)   Exercise Vital Sign    Days of Exercise per Week: 3 days    Minutes of Exercise per Session: 60 min  Stress: Stress Concern Present (07/22/2018)   [place]    Feeling of Stress : To some extent  Social Connections: Unknown (07/22/2018)   Social Connection and Isolation Panel [NHANES]    Frequency of Communication with Friends and Family: Once a week    Frequency of Social Gatherings with Friends and Family: Once a week    Attends Religious Services: More than 4 times per year    Active Member of [employer] or Organizations: Not on file    Attends Music therapist: Not on file    Marital Status: Married   Family History  Problem Relation Age of Onset   Heart disease Mother    Pancreatitis Mother    Emphysema Father    Breast cancer Sister    Breast cancer Sister    Scheduled Meds:  amLODipine  10 mg Oral Daily   aspirin  81 mg Oral Daily   Or   aspirin  300 mg Rectal Daily   atorvastatin  20 mg Oral Daily   carbidopa-levodopa  1 tablet Oral Once   carbidopa-levodopa  1 tablet Oral 2  times per day   carbidopa-levodopa  1.5 tablet Oral TID   Chlorhexidine Gluconate Cloth  6 each Topical Daily   cyanocobalamin  1,000 mcg Oral Once per day on Mon Thu   escitalopram  10 mg Oral Daily   multivitamin-lutein  1 capsule Oral Daily   tamsulosin  0.4 mg Oral Daily   Continuous Infusions:  sodium chloride 50 mL/hr at 06/27/22 0628   PRN Meds:.acetaminophen, albuterol, haloperidol lactate, labetalol, traMADol Medications Prior to Admission:  Prior to Admission medications   Medication Sig Start Date End Date Taking? Authorizing Provider  alendronate (FOSAMAX) 70 MG tablet Take 70 mg by mouth every Sunday.   Yes [provider]  amLODipine (NORVASC) 10 MG tablet Take 10 mg by mouth daily.   Yes [provider]  atorvastatin (LIPITOR) 20 MG tablet Take 20 mg by mouth daily.   Yes [provider]  carbidopa-levodopa (SINEMET IR) 25-100 MG tablet Take 1-1.5 tablets by mouth See admin instructions. Take 1.5 tablet (0800) by mouth in the morning, then 1.5 tablet (1200) by mouth at noon, then 1.5 tablet by mouth at 5 pm (1700), then 1 tablet by mouth at bedtime (2200), then 1 tablet at 0300. 06/15/19  Yes [provider]  cyanocobalamin (VITAMIN B12) 1000 MCG tablet Take 1 tablet (1,000 mcg total) by mouth 2 (two) times a week. 06/18/22  Yes Earlie Server, MD  escitalopram (LEXAPRO) 10 MG tablet Take 10 mg by mouth daily.   Yes [provider]  Multiple Vitamins-Minerals (PRESERVISION AREDS 2 PO) Take 1 tablet by mouth in the morning and at bedtime.   Yes [provider]  acetaminophen (TYLENOL) 500 MG tablet Take 1,000 mg by mouth every 8 (eight) hours as needed for moderate pain.    [provider]  albuterol (VENTOLIN HFA) 108 (90 Base) MCG/ACT inhaler Inhale 2 puffs into the lungs every 6 (six) hours as needed for wheezing or shortness of  breath.    [provider]  diclofenac Sodium (VOLTAREN) 1 % GEL Apply 1 application  topically 2 (two) times daily as needed (pain).    [provider]  Menthol-Methyl Salicylate (BEN GAY GREASELESS) 10-15 % greaseless cream Apply 1 application topically daily as needed for pain.    [provider]  nystatin cream (MYCOSTATIN) Apply 1 application topically 2 (two) times daily as needed for dry skin (on bottom).    [provider]  rivastigmine (EXELON) 3 MG capsule Take 3 mg by mouth 2 (two) times daily with a meal. Patient not taking: Reported on 06/24/2022    [provider]  traMADol (ULTRAM) 50 MG tablet Take 1 tablet (50 mg total) by mouth every 6 (six) hours as needed. 12/06/21 12/06/22  Herbert Pun, MD   No Known Allergies Review of Systems  Unable to perform ROS: Dementia    Physical Exam Vitals and nursing note reviewed.  Constitutional:      General: He is not in acute distress.    Appearance: He is ill-appearing.  HENT:     Mouth/Throat:     Mouth: Mucous membranes are dry.  Cardiovascular:     Rate and Rhythm: Normal rate.  Pulmonary:     Effort: Pulmonary effort is normal. No respiratory distress.  Musculoskeletal:        General: No swelling.  Skin:    General: Skin is warm and dry.     Coloration: Skin is pale.  Neurological:     Mental Status: He is alert.     Comments: Known dementia, oriented to self only   Psychiatric:        Mood and Affect: Mood normal.        Behavior: Behavior normal.     Vital Signs: BP 136/73 (BP Location: Right Arm)   Pulse 73   Temp 98.1 F (36.7 C)   Resp 18   Ht 6' (1.829 m)   Wt 71.4 kg   SpO2 100%   BMI 21.33 kg/m  Pain Scale: PAINAD   Pain Score: 0-No pain   SpO2: SpO2: 100 % O2 Device:SpO2: 100 % O2 Flow Rate: .   IO: Intake/output summary:  Intake/Output Summary (Last 24 hours) at 06/27/2022 1244 Last data filed at 06/27/2022 1009 Gross per 24 hour  Intake 365.83 ml  Output --  Net 365.83 ml    LBM: Last BM Date : 06/27/22 Baseline Weight:  Weight: 71.4 kg Most recent weight: Weight: 71.4 kg     Palliative Assessment/Data:   Flowsheet Rows    Flowsheet Row Most Recent Value  Intake Tab   Referral Department Hospitalist  Unit at Time of Referral Cardiac/Telemetry Unit  Palliative Care Primary Diagnosis Neurology  Date Notified 06/26/22  Palliative Care Type New Palliative care  Reason for referral Clarify Goals of Care  Date of Admission 06/24/22  Date first seen by Palliative Care 06/27/22  # of days Palliative referral response time 1 Day(s)  # of days IP prior to Palliative referral 2  Clinical Assessment   Palliative Performance Scale Score 20%  Pain Max last 24 hours Not able to report  Pain Min Last 24 hours Not able to report  Dyspnea Max Last 24 Hours Not able to report  Dyspnea Min Last 24 hours Not able to report  Psychosocial & Spiritual Assessment   Palliative Care Outcomes        Time In: 0920 Time Out: 1035 Time Total: 75 minutes  Greater  than 50%  of this time was spent counseling and coordinating care related to the above assessment and plan.  Signed by: Drue Novel, NP   Please contact Palliative Medicine Team phone at [phone] for questions and concerns.  For individual provider: See Shea Evans

## 2022-06-27 NOTE — Hospital Course (Signed)
Patient is a 88 year old male with history of Parkinson's disease, dementia with behavioral disturbances, renal cell carcinoma, hypertension, hyperlipidemia who presented to the ED on 06/24/2022 with complaints of generalized weakness more on the right leg for 1 day, slurred speech.  Patient was admitted for the suspicion of  stroke.  MRI of the brain showed a small stroke in the internal capsule on the left.    Neurology consulted .  Stroke work-up completed.   PT/OT recommending skilled nursing facility on discharge.    Medically stable for discharge whenever rehab bed is available.  TOC following.  Palliative care consulted for [place] discussions given dysphagia as result of stroke, underlying dementia at baseline.  Outpatient palliative care to follow after discharge. Code status DNR.

## 2022-06-27 NOTE — Progress Notes (Signed)
Progress Note   Patient: Kevin [name]:[id number] DOB: 1933-10-09 DOA: 06/24/2022     2 DOS: the patient was seen and examined on 06/27/2022   Brief hospital course: Patient is a 88 year old male with history of Parkinson's disease, dementia with behavioral disturbances, renal cell carcinoma, hypertension, hyperlipidemia who presented to the ED on 06/24/2022 with complaints of generalized weakness more on the right leg for 1 day, slurred speech.  Patient was admitted for the suspicion of  stroke.  MRI of the brain showed a small stroke in the internal capsule on the left.    Neurology consulted .  Stroke work-up completed.   PT/OT recommending skilled nursing facility on discharge.    Medically stable for discharge whenever rehab bed is available.  TOC following.  Palliative care consulted for [place] discussions given dysphagia as result of stroke, underlying dementia at baseline.  Outpatient palliative care to follow after discharge. Code status DNR.  Assessment and Plan: * Acute ischemic stroke [facility])  Presented with complaints of inability to ambulate and dropping things (patient is right-handed). 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  CTA head and neck negative for large vessel occlusions or flow-limiting stenoses.  Aortic atherosclerosis noted. Echo with EF normal 66 5%, grade 1 diastolic dysfunction, interatrial septum not well visualized. LDL is 79.  Hemoglobin A1c is 5.3%. --Appreciate neurology recommendations -- Continue aspirin 81 mg  -- No DAPT due to high falls risk -- Continue Lipitor 40 mg -- BP goal: Normotension --SLP following, on dysphagia diet with thickened liquids -- PT OT recommending SNF, placement is pending -- Outpatient neurology follow-up in 4 to 6 weeks  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HCC) Continue Sinemet, Lexapro. Stop Exelon, this was recently tapered down and then  stopped in the outpatient setting after most recent neurology follow-up with Dr. Manuella Ghazi. --Delirium precautions -- Follow-up with Neurology in 4-6 weeks   Hypokalemia Replacing for K 2.8 this AM. Monitor BMP and replace K as needed. Check Mg level.  Acute urinary retention Foley catheter was placed after patient required multiple in/out caths for urinary retention ranging from 400 cc to 1 L. -- Discontinue Foley for voiding trial -- Bladder scans -- Replace Foley and follow-up with urology as outpatient if still retaining -- Has been started on Flomax  Hypertension BPs are stable. -- Continue amlodipine  GERD (gastroesophageal reflux disease) Continue PPI        Subjective: Patient was up in recliner seen with wife at bedside today.  Wife reports that his neurologist Dr. Brigitte Pulse had taken patient off of Exelon after most recent visit.  She confirmed that he does still take Lexapro.  She denies prior history of issues with urinary retention or ever needing a Foley catheter.  Otherwise she reports concern of his profound weakness and not being able to manage at home.  Patient very hard of hearing and with baseline dementia, difficult to elicit any history but he currently denies pain or feeling sick.  No acute events reported.   Physical Exam: Vitals:   06/27/22 0503 06/27/22 0511 06/27/22 0755 06/27/22 1153  BP: (!) 159/88  (!) 142/73 136/73  Pulse: 79  66 73  Resp:  '18 20 18  '$ Temp:  98.4 F (36.9 C) 98.8 F (37.1 C) 98.1 F (36.7 C)  TempSrc:      SpO2: 98%  97% 100%  Weight:      Height:  General exam: awake, alert, no acute distress HEENT: Wearing glasses, hard of hearing, dry mucous membranes Respiratory system: CTAB, no wheezes, rales or rhonchi, normal respiratory effort. Cardiovascular system: normal S1/S2, RRR, no JVD, murmurs, rubs, gallops, no pedal edema.   Gastrointestinal system: soft, NT, ND, no HSM felt, +bowel sounds. Central nervous system: Exam  limited by dementia and hearing deficits but no gross focal neurologic deficits, normal speech Extremities: moves all, no edema, normal tone Skin: dry, intact, normal temperature Psychiatry: normal mood, flat affect, abnormal judgement and insight due to dementia   Data Reviewed:  Notable labs: Potassium 2.8, glucose 120, BUN 27, calcium 8.3 with albumin 3.3.  Lipid panel was entirely normal with LDL 79.  CBC with hemoglobin 10.3 otherwise normal  Family Communication: Wife at bedside on rounds  Disposition: Status is: Inpatient Remains inpatient appropriate because: Requires SNF/rehab placement which is pending   Planned Discharge Destination: Skilled nursing facility    Time spent: 45 minutes  Author: Ezekiel Slocumb, DO 06/27/2022 3:09 PM  For on call review [email or web address].

## 2022-06-27 NOTE — Assessment & Plan Note (Addendum)
Started on IV fluids by night provider for very low urine output.  Now off IV fluids.  Likely drinking less due to thickened liquids. Monitor urine output closely.

## 2022-06-28 DIAGNOSIS — I639 Cerebral infarction, unspecified: Secondary | ICD-10-CM | POA: Diagnosis not present

## 2022-06-28 DIAGNOSIS — I69391 Dysphagia following cerebral infarction: Secondary | ICD-10-CM

## 2022-06-28 DIAGNOSIS — Z515 Encounter for palliative care: Secondary | ICD-10-CM | POA: Diagnosis not present

## 2022-06-28 DIAGNOSIS — G20 Parkinson's disease: Secondary | ICD-10-CM | POA: Diagnosis not present

## 2022-06-28 DIAGNOSIS — Z7189 Other specified counseling: Secondary | ICD-10-CM | POA: Diagnosis not present

## 2022-06-28 HISTORY — DX: Other disorders of phosphorus metabolism: E83.39

## 2022-06-28 LAB — CBC
HCT: 34.7 % — ABNORMAL LOW (ref 39.0–52.0)
Hemoglobin: 11.3 g/dL — ABNORMAL LOW (ref 13.0–17.0)
MCH: 29.7 pg (ref 26.0–34.0)
MCHC: 32.6 g/dL (ref 30.0–36.0)
MCV: 91.3 fL (ref 80.0–100.0)
Platelets: 158 10*3/uL (ref 150–400)
RBC: 3.8 MIL/uL — ABNORMAL LOW (ref 4.22–5.81)
RDW: 14 % (ref 11.5–15.5)
WBC: 7.4 10*3/uL (ref 4.0–10.5)
nRBC: 0 % (ref 0.0–0.2)

## 2022-06-28 LAB — BASIC METABOLIC PANEL
Anion gap: 7 (ref 5–15)
BUN: 25 mg/dL — ABNORMAL HIGH (ref 8–23)
CO2: 25 mmol/L (ref 22–32)
Calcium: 8.6 mg/dL — ABNORMAL LOW (ref 8.9–10.3)
Chloride: 111 mmol/L (ref 98–111)
Creatinine, Ser: 0.71 mg/dL (ref 0.61–1.24)
GFR, Estimated: >60 mL/min (ref >60)
Glucose, Bld: 108 mg/dL — ABNORMAL HIGH (ref 70–99)
Potassium: 3.6 mmol/L (ref 3.5–5.1)
Sodium: 143 mmol/L (ref 135–145)

## 2022-06-28 LAB — PHOSPHORUS: Phosphorus: 2.3 mg/dL — ABNORMAL LOW (ref 2.5–4.6)

## 2022-06-28 MED ORDER — HALOPERIDOL 0.5 MG PO TABS
1.0000 mg | ORAL_TABLET | Freq: Two times a day (BID) | ORAL | Status: DC | PRN
  Administered 2022-06-28: 1 mg via ORAL
  Filled 2022-06-28: qty 2

## 2022-06-28 MED ORDER — ATORVASTATIN CALCIUM 20 MG PO TABS
40.0000 mg | ORAL_TABLET | Freq: Every day | ORAL | Status: DC
  Administered 2022-06-28: 40 mg via ORAL
  Filled 2022-06-28: qty 2

## 2022-06-28 MED ORDER — HALOPERIDOL LACTATE 2 MG/ML PO CONC
1.0000 mg | Freq: Two times a day (BID) | ORAL | Status: DC | PRN
  Filled 2022-06-28: qty 5

## 2022-06-28 MED ORDER — ENOXAPARIN SODIUM 40 MG/0.4ML IJ SOSY
40.0000 mg | PREFILLED_SYRINGE | INTRAMUSCULAR | Status: DC
  Administered 2022-06-28: 40 mg via SUBCUTANEOUS
  Filled 2022-06-28: qty 0.4

## 2022-06-28 MED ORDER — POTASSIUM & SODIUM PHOSPHATES 280-160-250 MG PO PACK
1.0000 | PACK | Freq: Three times a day (TID) | ORAL | Status: AC
  Administered 2022-06-28 (×3): 1 via ORAL
  Filled 2022-06-28 (×3): qty 1

## 2022-06-28 NOTE — Care Management Important Message (Signed)
Important Message  Patient Details  Name: Kevin [name] MRN: [id number] Date of Birth: 1933/03/08   Medicare Important Message Given:  N/A - LOS <3 / Initial given by admissions     Kevin [name] 06/28/2022, 9:08 AM

## 2022-06-28 NOTE — Assessment & Plan Note (Addendum)
SLP following.   On dysphagia 1 diet with honey thick liquids. Renal function and sodium level are stable off maintenance IV fluids. Monitor for adequate PO hydration / intake. Repeat BMP, Mg, Phos in 1 week.

## 2022-06-28 NOTE — Progress Notes (Addendum)
Palliative: Kevin [name] is sitting up in the [place] chair in his room.  He is looking into the corner and waving at something.  When asked, he is not sure what he was doing.  He is alert, oriented to self with known dementia.  His mouth is dry, and he is difficult to understand until given a sip of honey thick orange juice.  I am not sure that he can make his basic needs known.  His wife of 60+ years, Kevin [name], is present at bedside.  We talk about Kevin [name] acute and chronic issues including, but not limited to, Parkinson's disease, mobility, acute stroke, dysphagia, poor by mouth intake.  I share that it is important for Kevin [name] to encourage/push fluids as he is no longer receiving IV fluids.  We talk about the benefits of continued speech therapy at rehab.  We talk about time for outcomes.  We also talk about a few what if's and maybe's.  Kevin [name] states that her husband has completed a living will and would not accept artificial nutrition via PEG tube.  We talked about the benefits of transitioning to hospice care when the time is right.  Kevin [name] states that her husband would not want to live in a diminished condition, and she is considering choices.  Conference with attending, bedside nursing staff, speech therapy, transition of care team related to patient condition, needs, goals of care, disposition. Addendum: [place]/DNR form completed and placed on chart.  Plan: At this point continue to treat the treatable but no CPR or intubation.  [facility] where they have been a resident in independent living for 4+ years.  Outpatient palliative services with [facility].  89 minutes Kevin Axe, NP Palliative medicine team Team phone [phone] Greater than 50% of this time was spent counseling and coordinating care related to the above assessment and plan.

## 2022-06-28 NOTE — Progress Notes (Signed)
Occupational Therapy Treatment Patient Details Name: Kevin [name] MRN: [id number] DOB: December 11, 1932 Today's Date: 06/28/2022   History of present illness Kevin [name] is a 88 y.o. male with medical history significant for renal cancer status post nephrectomy, Parkinson's disease, dementia related to Parkinson's disease, hypertension who was brought into the ER by EMS for evaluation of weakness. MRI brain: "small acute infarct in the posterior limb of the left internal capsule."   OT comments  Pt seen for OT tx and overlap co-tx with PT to address ADL transfers/mobility. Pt required MIN A +2 for ADL transfers with verbal and tactile cues for hand placement and anterior weight shift prior to standing. Once on his feet, OT provided chair follow while PTA assisted with mobility. Once returned to room and in recliner, pt required  setup and verbal/visual cues to initiate washing his face (HOH making it more challenging to understand simple commands). He also required initial set up for loaded spoon into his hand to bring to mouth with VC, increasing to need for MAX A to bring spoon to mouth by 3-4th bites 2/2 fatigue and pt citing parkinson's making it more difficult to manage utensils (no tremors noted). Pt progressing towards goals, continues to benefit from skilled OT services. Continue to recommend SNF at this time.    Recommendations for follow up therapy are one component of a multi-disciplinary discharge planning process, led by the attending physician.  Recommendations may be updated based on patient status, additional functional criteria and insurance authorization.    Follow Up Recommendations  Skilled nursing-short term rehab (<3 hours/day)    Assistance Recommended at Discharge Frequent or constant Supervision/Assistance  Patient can return home with the following  A lot of help with walking and/or transfers;A lot of help with bathing/dressing/bathroom;Direct supervision/assist for medications  management;Direct supervision/assist for financial management;Assistance with cooking/housework;Assist for transportation;Help with stairs or ramp for entrance   Equipment Recommendations  Other (comment) (defer to next venue)    Recommendations for Other Services      Precautions / Restrictions Precautions Precautions: Fall Restrictions Weight Bearing Restrictions: No       Mobility Bed Mobility               General bed mobility comments: NT, up in recliner when OT arrived after PT arrived    Transfers Overall transfer level: Needs assistance Equipment used: Rolling walker (2 wheels) Transfers: Sit to/from Stand Sit to Stand: Min assist, +2 physical assistance           General transfer comment: VC and TC for hand placement     Balance Overall balance assessment: Needs assistance Sitting-balance support: Feet supported Sitting balance-Leahy Scale: Fair Sitting balance - Comments: able to sit with supervision today but remains unsafe to be left unattended in sitting while unsupported.   Standing balance support: Reliant on assistive device for balance, Bilateral upper extremity supported Standing balance-Leahy Scale: Poor Standing balance comment: mod a for support and balance                           ADL either performed or assessed with clinical judgement   ADL Overall ADL's : Needs assistance/impaired Eating/Feeding: Sitting;Maximal assistance;Set up Eating/Feeding Details (indicate cue type and reason): initial set up for loaded spoon into his hand to bring to mouth with VC, increasing to need for MAX A to bring spoon to mouth by 3-4th bites 2/2 fatigue and pt citing parkinson's making it more  difficult to manage utensils (no tremors noted). Grooming: Sitting;Set up;Wash/dry face Grooming Details (indicate cue type and reason): VC + visual cue to initiate washing face                                    Extremity/Trunk  Assessment              Vision       Perception     Praxis      Cognition Arousal/Alertness: Awake/alert Behavior During Therapy: WFL for tasks assessed/performed Overall Cognitive Status: History of cognitive impairments - at baseline                                          Exercises      Shoulder Instructions       General Comments      Pertinent Vitals/ Pain       Pain Assessment Pain Assessment: No/denies pain  Home Living                                          Prior Functioning/Environment              Frequency  Min 2X/week        Progress Toward Goals  OT Goals(current goals can now be found in the care plan section)  Progress towards OT goals: Progressing toward goals  Acute Rehab OT Goals Patient Stated Goal: unable to state OT Goal Formulation: Patient unable to participate in goal setting Time For Goal Achievement: 07/09/22 Potential to Achieve Goals: Good  Plan Discharge plan remains appropriate;Frequency remains appropriate    Co-evaluation    PT/OT/SLP Co-Evaluation/Treatment: Yes Reason for Co-Treatment: For patient/therapist safety;To address functional/ADL transfers PT goals addressed during session: Mobility/safety with mobility;Balance;Proper use of DME OT goals addressed during session: ADL's and self-care      AM-PAC OT "6 Clicks" Daily Activity     Outcome Measure   Help from another person eating meals?: A Little Help from another person taking care of personal grooming?: A Little Help from another person toileting, which includes using toliet, bedpan, or urinal?: A Lot Help from another person bathing (including washing, rinsing, drying)?: A Lot Help from another person to put on and taking off regular upper body clothing?: A Little Help from another person to put on and taking off regular lower body clothing?: A Lot 6 Click Score: 15    End of Session Equipment Utilized  During Treatment: Gait belt;Rolling walker (2 wheels)  OT Visit Diagnosis: Other abnormalities of gait and mobility (R26.89);History of falling (Z91.81);Cognitive communication deficit (R41.841);Muscle weakness (generalized) (M62.81) Symptoms and signs involving cognitive functions: Cerebral infarction   Activity Tolerance Patient tolerated treatment well   Patient Left in chair;with call bell/phone within reach;with chair alarm set   Nurse Communication          Time: [phone] OT Time Calculation (min): 17 min  Charges: OT General Charges $OT Visit: 1 Visit OT Treatments $Self Care/Home Management : 8-22 mins  Ardeth Perfect., MPH, MS, OTR/L ascom [phone] 06/28/22, 2:44 PM

## 2022-06-28 NOTE — Progress Notes (Signed)
Progress Note   Patient: Kevin [name] JOI:[id number] DOB: 1933-10-11 DOA: 06/24/2022     3 DOS: the patient was seen and examined on 06/28/2022   Brief hospital course: Patient is a 88 year old male with history of Parkinson's disease, dementia with behavioral disturbances, renal cell carcinoma, hypertension, hyperlipidemia who presented to the ED on 06/24/2022 with complaints of generalized weakness more on the right leg for 1 day, slurred speech.  Patient was admitted for the suspicion of  stroke.  MRI of the brain showed a small stroke in the internal capsule on the left.    Neurology consulted .  Stroke work-up completed.   PT/OT recommending skilled nursing facility on discharge.    Medically stable for discharge whenever rehab bed is available.  TOC following.  Palliative care consulted for [place] discussions given dysphagia as result of stroke, underlying dementia at baseline.  Outpatient palliative care to follow after discharge. Code status DNR.  9/7: stop IV fluids and monitor PO intake, repeat labs in AM.   Haldol order d/c'd, patient has been calm and not required any PRN medications recently.   Assessment and Plan: * Acute ischemic stroke [facility])  Presented with complaints of inability to ambulate and dropping things (patient is right-handed). 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  CTA head and neck negative for large vessel occlusions or flow-limiting stenoses.  Aortic atherosclerosis noted. Echo with EF normal 66 5%, grade 1 diastolic dysfunction, interatrial septum not well visualized. LDL is 79.  Hemoglobin A1c is 5.3%. --Appreciate neurology recommendations -- Continue aspirin 81 mg  -- No DAPT due to high falls risk -- Continue Lipitor 40 mg -- BP goal: Normotension --SLP following, on dysphagia diet with thickened liquids -- PT OT recommending SNF, placement is pending -- Outpatient neurology follow-up in 4 to 6  weeks  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HCC) Continue Sinemet, Lexapro. Stop Exelon, this was recently tapered down and then stopped in the outpatient setting after most recent neurology follow-up with Dr. Manuella Ghazi. --Delirium precautions -- Follow-up with Neurology in 4-6 weeks   Hypokalemia Replaced on 9/6 for K 2.8 >> improved to  3.6 this AM.  Monitor BMP and replace K as needed. Last Mg was 2.1 normal.  Dysphagia due to recent cerebrovascular accident (CVA) SLP following.   On dysphagia 1 diet with honey thick liquids. Stop maintenance IV fluids and monitor for adequate PO hydration / intake. Repeat BMP in AM.  Hypophosphatemia Phos 2.3 this AM, replacing with PO phos-NaK x 3 packets today with meals.  Oliguria Started on IV fluids by night provider for very low urine output.  Continue IV fluids for now and monitor urine output closely.  Discontinue Foley for voiding trial.  Acute urinary retention Foley catheter was placed after patient required multiple in/out caths for urinary retention ranging from 400 cc to 1 L. -- Foley removed 9/6 for voiding trial -- Now with external catheter -- Bladder scans to monitor for retention -- Replace Foley and follow-up with urology as outpatient if still retaining -- Has been started on Flomax  Hypertension BPs are stable. -- Continue amlodipine  GERD (gastroesophageal reflux disease) Continue PPI        Subjective: Patient up in recliner, wife at bedside when seen on rounds.  He is awake but drowsy.  Says he is feeling a little better today. No acute complaints or acute events reported.  Discussed with wife being off IV  fluids and see how his PO intake is, he'll need frequent encouragement to drink and stay hydrated.     Physical Exam: Vitals:   06/28/22 0054 06/28/22 0415 06/28/22 0732 06/28/22 1153  BP: (!) 164/89 (!) 162/89 (!) 146/85 (!) 145/94  Pulse: 80 80 72 82  Resp: '16 16 17 18   '$ Temp: 98.3 F (36.8 C) 98.3 F (36.8 C) 99.2 F (37.3 C) 99.1 F (37.3 C)  TempSrc:   Oral   SpO2: 100% 95% 99% 98%  Weight:      Height:       General exam: awake, appears drowsy but able to stay engaged, no acute distress HEENT: moist mucus membranes, hearing grossly normal  Respiratory system: CTAB, no wheezes, rales or rhonchi, normal respiratory effort. Cardiovascular system: normal S1/S2, RRR, no pedal edema.   Gastrointestinal system: soft, NT, ND Central nervous system: grossly non-focal exam, alert, oriented to self Extremities: moves all, no edema, normal tone Skin: dry, intact, normal temperature Psychiatry: normal mood, congruent affect, abnormal judgement and insight due to dementia    Data Reviewed:  Notable labs --- phos 2.3,  BUN 25, Hbg 10.3 >> 11.3   Family Communication: wife at bedside on rounds  Disposition: Status is: Inpatient Remains inpatient appropriate because: requires SNF/rehab, placement pending.  Monitoring off IV fluids for adequate PO hydration given dysphagia due to his stroke.   Planned Discharge Destination: Skilled nursing facility    Time spent: 35 minutes  Author: Ezekiel Slocumb, DO 06/28/2022 2:29 PM  For on call review [email or web address].

## 2022-06-28 NOTE — Assessment & Plan Note (Addendum)
Resolved with replacement on 9/7 for phos 2.3. Phos 3.1 today. Repeat level in 1 week.

## 2022-06-28 NOTE — Progress Notes (Signed)
Physical Therapy Treatment Patient Details Name: Kevin [name] MRN: [id number] DOB: [birth year] Today's Date: 06/28/2022   History of Present Illness Kevin [name] is a 88 y.o. male with medical history significant for renal cancer status post nephrectomy, Parkinson's disease, dementia related to Parkinson's disease, hypertension who was brought into the ER by EMS for evaluation of weakness. MRI brain: "small acute infarct in the posterior limb of the left internal capsule."    PT Comments    Overlap with OT for mobility for pt and Environmental manager.  Pt is able to get to EOB today with increased time and verbal/tactile cues.  Min a x 1 to get hips fully to EOB.  He is generally steady in sitting today but is unsafe to be left alone.  He is able to stand with min a x 2 and progress gait to 20' x 2 with mod a x 1 provided on R side due to R and post lean with OT providing recliner follow.  Pt did seem pleased with progress and smiling at end of session.  OT remains in room after session to continue treatment.   Recommendations for follow up therapy are one component of a multi-disciplinary discharge planning process, led by the attending physician.  Recommendations may be updated based on patient status, additional functional criteria and insurance authorization.  Follow Up Recommendations  Skilled nursing-short term rehab (<3 hours/day) Can patient physically be transported by private vehicle: Yes   Assistance Recommended at Discharge Frequent or constant Supervision/Assistance  Patient can return home with the following Assistance with cooking/housework;Direct supervision/assist for medications management;Direct supervision/assist for financial management;Assist for transportation;Help with stairs or ramp for entrance;Two people to help with walking and/or transfers;A lot of help with bathing/dressing/bathroom   Equipment Recommendations  None recommended by PT    Recommendations for Other  Services       Precautions / Restrictions Precautions Precautions: Fall Restrictions Weight Bearing Restrictions: No     Mobility  Bed Mobility Overal bed mobility: Needs Assistance Bed Mobility: Supine to Sit     Supine to sit: Min assist     General bed mobility comments: increased time but only needs min a x 1 to get hips fully to EOB    Transfers Overall transfer level: Needs assistance Equipment used: Rolling walker (2 wheels) Transfers: Sit to/from Stand Sit to Stand: Min assist, +2 physical assistance                Ambulation/Gait Ambulation/Gait assistance: Mod assist Gait Distance (Feet): 20 Feet Assistive device: Rolling walker (2 wheels) Gait Pattern/deviations: Step-to pattern, Narrow base of support Gait velocity: decreased     General Gait Details: 20' x with assist R side due to right/post lean and poor walker control   Stairs             Wheelchair Mobility    Modified Rankin (Stroke Patients Only)       Balance Overall balance assessment: Needs assistance Sitting-balance support: Feet supported Sitting balance-Leahy Scale: Fair Sitting balance - Comments: able to sit with supervision today but remains unsafe to be left unatteneded in sitting.   Standing balance support: Reliant on assistive device for balance, Bilateral upper extremity supported Standing balance-Leahy Scale: Poor Standing balance comment: mod a for support and balance                            Cognition Arousal/Alertness: Awake/alert Behavior During Therapy: WFL for  tasks assessed/performed Overall Cognitive Status: History of cognitive impairments - at baseline                                 General Comments: more talkative today but speech is garbled and diffictult to understand.  smiling and seemed happier today        Exercises      General Comments        Pertinent Vitals/Pain Pain Assessment Pain Assessment:  No/denies pain    Home Living                          Prior Function            PT Goals (current goals can now be found in the care plan section) Progress towards PT goals: Progressing toward goals    Frequency    7X/week      PT Plan Current plan remains appropriate    Co-evaluation PT/OT/SLP Co-Evaluation/Treatment: Yes Reason for Co-Treatment: For patient/therapist safety PT goals addressed during session: Mobility/safety with mobility;Balance;Proper use of DME OT goals addressed during session: ADL's and self-care      AM-PAC PT "6 Clicks" Mobility   Outcome Measure  Help needed turning from your back to your side while in a flat bed without using bedrails?: A Little Help needed moving from lying on your back to sitting on the side of a flat bed without using bedrails?: A Little Help needed moving to and from a bed to a chair (including a wheelchair)?: A Lot Help needed standing up from a chair using your arms (e.g., wheelchair or bedside chair)?: A Lot Help needed to walk in hospital room?: A Lot Help needed climbing 3-5 steps with a railing? : Total 6 Click Score: 13    End of Session Equipment Utilized During Treatment: Gait belt Activity Tolerance: Patient tolerated treatment well Patient left: with call bell/phone within reach;with family/visitor present;in chair;with chair alarm set Nurse Communication: Mobility status PT Visit Diagnosis: Unsteadiness on feet (R26.81);Repeated falls (R29.6);Muscle weakness (generalized) (M62.81);History of falling (Z91.81);Difficulty in walking, not elsewhere classified (R26.2)     Time: 0277-4128 PT Time Calculation (min) (ACUTE ONLY): 14 min  Charges:  $Gait Training: 8-22 mins                   Chesley Noon, PTA 06/28/22, 10:13 AM

## 2022-06-28 NOTE — Progress Notes (Signed)
Dr Arbutus Ped and RN Robert Bellow made aware that per Tele monitoring pt had 3 beat run of Vtach, acknowledged, no new orders

## 2022-06-28 NOTE — TOC Progression Note (Addendum)
Transition of Care [facility]) - Progression Note    Patient Details  Name: Kevin [name] MRN: [id number] Date of Birth: Aug 21, 1933  [facility] CM/SW Contact  Laurena Slimmer, RN Phone Number: 06/28/2022, 1:49 PM  Clinical Narrative:    Per TOC CMA, Dannette Barbara, patient authorization received for 9/7 - 9/13, review due 9/14.  Certification: [id number].  Reviewer is Event organiser.  She can be reached at [phone] phone and [phone] fax. Per admissions director at [facility] patient would have to be sitter free for 48 hourse prior to return in addition to having discharge summary received by 1:30 pm to avoid delaying discharge until 9/11. MD notified        Expected Discharge Plan and Services                                                 Social Determinants of Health (SDOH) Interventions    Readmission Risk Interventions     No data to display

## 2022-06-29 ENCOUNTER — Encounter: Payer: Self-pay | Admitting: Internal Medicine

## 2022-06-29 DIAGNOSIS — I639 Cerebral infarction, unspecified: Secondary | ICD-10-CM | POA: Diagnosis not present

## 2022-06-29 LAB — MAGNESIUM: Magnesium: 2.2 mg/dL (ref 1.7–2.4)

## 2022-06-29 LAB — BASIC METABOLIC PANEL
Anion gap: 11 (ref 5–15)
BUN: 28 mg/dL — ABNORMAL HIGH (ref 8–23)
CO2: 23 mmol/L (ref 22–32)
Calcium: 9 mg/dL (ref 8.9–10.3)
Chloride: 110 mmol/L (ref 98–111)
Creatinine, Ser: 0.73 mg/dL (ref 0.61–1.24)
GFR, Estimated: >60 mL/min (ref >60)
Glucose, Bld: 107 mg/dL — ABNORMAL HIGH (ref 70–99)
Potassium: 3.4 mmol/L — ABNORMAL LOW (ref 3.5–5.1)
Sodium: 144 mmol/L (ref 135–145)

## 2022-06-29 LAB — PHOSPHORUS: Phosphorus: 3.1 mg/dL (ref 2.5–4.6)

## 2022-06-29 MED ORDER — LOSARTAN POTASSIUM 25 MG PO TABS
25.0000 mg | ORAL_TABLET | Freq: Every day | ORAL | Status: AC
Start: 2022-06-29 — End: ?

## 2022-06-29 MED ORDER — TAMSULOSIN HCL 0.4 MG PO CAPS: 0.4000 mg | ORAL_CAPSULE | Freq: Every day | ORAL | Status: AC

## 2022-06-29 MED ORDER — ASPIRIN 81 MG PO CHEW: 81.0000 mg | CHEWABLE_TABLET | Freq: Every day | ORAL | Status: AC

## 2022-06-29 MED ORDER — ATORVASTATIN CALCIUM 40 MG PO TABS: 40.0000 mg | ORAL_TABLET | Freq: Every day | ORAL | Status: AC

## 2022-06-29 MED ORDER — POTASSIUM CHLORIDE 20 MEQ PO PACK
40.0000 meq | PACK | ORAL | Status: DC
  Administered 2022-06-29: 40 meq via ORAL
  Filled 2022-06-29 (×2): qty 2

## 2022-06-29 MED ORDER — LOSARTAN POTASSIUM 25 MG PO TABS
25.0000 mg | ORAL_TABLET | Freq: Every day | ORAL | Status: DC
  Administered 2022-06-29: 25 mg via ORAL
  Filled 2022-06-29: qty 1

## 2022-06-29 MED ORDER — TRAMADOL HCL 50 MG PO TABS: 25.0000 mg | ORAL_TABLET | Freq: Four times a day (QID) | ORAL | 0 refills | Status: AC | PRN

## 2022-06-29 NOTE — Discharge Summary (Signed)
Physician Discharge Summary   Patient: Kevin [name] MRN: [id number] DOB: [birth year]  Admit date:     06/24/2022  Discharge date: 06/29/22  Discharge Physician: Ezekiel Slocumb   PCP: Baxter Hire, MD   Recommendations at discharge:   Follow up with Primary Care in 1 week Repeat BMP, Mg, Phos, CBC in 1 week Follow up with Neurology Palliative Care to follow patient after discharge Reassess swallow in 2-3 weeks  Discharge Diagnoses: Principal Problem:   Acute ischemic stroke [facility]) Active Problems:   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HCC)   Hypokalemia   GERD (gastroesophageal reflux disease)   Hypertension   Oliguria   Dysphagia due to recent cerebrovascular accident (CVA)  Resolved Problems:   Acute urinary retention   Hypophosphatemia  Hospital Course: Patient is a 88 year old male with history of Parkinson's disease, dementia with behavioral disturbances, renal cell carcinoma, hypertension, hyperlipidemia who presented to the ED on 06/24/2022 with complaints of generalized weakness more on the right leg for 1 day, slurred speech.  Patient was admitted for the suspicion of  stroke.  MRI of the brain showed a small stroke in the internal capsule on the left.    Neurology consulted .  Stroke work-up completed.   PT/OT recommending skilled nursing facility on discharge.    Medically stable for discharge whenever rehab bed is available.  TOC following.  Palliative care consulted for [place] discussions given dysphagia as result of stroke, underlying dementia at baseline.  Outpatient palliative care to follow after discharge. Code status DNR.   9/8: Pt doing well.  Labs are stable off IV fluids, appears PO intake is adequate, but he requires encouragement.  He is medically stable to discharge to rehab today.   Assessment and Plan: * Acute ischemic stroke [facility])  Presented with complaints of inability to ambulate and dropping things (patient is  right-handed). 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  CTA head and neck negative for large vessel occlusions or flow-limiting stenoses.  Aortic atherosclerosis noted. Echo with EF normal 66 5%, grade 1 diastolic dysfunction, interatrial septum not well visualized. LDL is 79.  Hemoglobin A1c is 5.3%. --Appreciate neurology recommendations -- Continue aspirin 81 mg  -- No DAPT due to high falls risk -- Continue Lipitor 40 mg -- BP goal: Normotension --SLP following, on dysphagia diet with thickened liquids -- PT OT recommending SNF/rehab -- Outpatient neurology follow-up in 4 to 6 weeks  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HCC) Continue Sinemet, Lexapro. Stop Exelon, this was recently tapered down and then stopped in the outpatient setting after most recent neurology follow-up with Dr. Manuella Ghazi. --Delirium precautions -- Follow-up with Neurology in 4-6 weeks   Hypokalemia Replaced on 9/6 for K 2.8 >> improved to  3.6 Mg is normal at 2.2 Oral replacement given today for K 3.4. Repeat BMP in 1 week and replace K as needed.   Dysphagia due to recent cerebrovascular accident (CVA) SLP following.   On dysphagia 1 diet with honey thick liquids. Renal function and sodium level are stable off maintenance IV fluids. Monitor for adequate PO hydration / intake. Repeat BMP, Mg, Phos in 1 week.  Oliguria Started on IV fluids by night provider for very low urine output.  Now off IV fluids.  Likely drinking less due to thickened liquids. Monitor urine output closely.   Hypertension BPs have been mildly elevated with systolic 165'V on his amlodipine 10 mg. --  Continue amlodipine -- Add losartan 25 mg daily -- Monitor BP 2-3 times daily for next week / until Primary Care follow up   GERD (gastroesophageal reflux disease) Continue PPI  Hypophosphatemia-resolved as of 06/29/2022 Resolved with replacement  on 9/7 for phos 2.3. Phos 3.1 today. Repeat level in 1 week.  Acute urinary retention-resolved as of 06/29/2022 Foley catheter was placed after patient required multiple in/out caths for urinary retention ranging from 400 cc to 1 L.  Foley removed 9/6 for voiding trial and appears no longer having retention. -- Monitor urine output -- Started on Flomax, continue -- Urology outpatient follow up as needed         Consultants: Neurology, Palliative Care   Procedures performed: Echocardiogram with normal LVEF 37-16%, grade 1 diastolic dysfunction   Disposition: Skilled nursing facility  Diet recommendation:  Dysphagia type 1 Honey thickened Liquid   DISCHARGE MEDICATION: Allergies as of 06/29/2022   No Known Allergies      Medication List     STOP taking these medications    rivastigmine 3 MG capsule Commonly known as: EXELON       TAKE these medications    acetaminophen 500 MG tablet Commonly known as: TYLENOL Take 1,000 mg by mouth every 8 (eight) hours as needed for moderate pain.   albuterol 108 (90 Base) MCG/ACT inhaler Commonly known as: VENTOLIN HFA Inhale 2 puffs into the lungs every 6 (six) hours as needed for wheezing or shortness of breath.   alendronate 70 MG tablet Commonly known as: FOSAMAX Take 70 mg by mouth every Sunday.   amLODipine 10 MG tablet Commonly known as: NORVASC Take 10 mg by mouth daily.   aspirin 81 MG chewable tablet Chew 1 tablet (81 mg total) by mouth daily.   atorvastatin 40 MG tablet Commonly known as: LIPITOR Take 1 tablet (40 mg total) by mouth daily. What changed:  medication strength how much to take   BEN GAY GREASELESS 10-15 % greaseless cream Apply 1 application topically daily as needed for pain.   carbidopa-levodopa 25-100 MG tablet Commonly known as: SINEMET IR Take 1-1.5 tablets by mouth See admin instructions. Take 1.5 tablet (0800) by mouth in the morning, then 1.5 tablet (1200) by mouth at noon, then  1.5 tablet by mouth at 5 pm (1700), then 1 tablet by mouth at bedtime (2200), then 1 tablet at 0300.   cyanocobalamin 1000 MCG tablet Commonly known as: VITAMIN B12 Take 1 tablet (1,000 mcg total) by mouth 2 (two) times a week.   diclofenac Sodium 1 % Gel Commonly known as: VOLTAREN Apply 1 application topically 2 (two) times daily as needed (pain).   escitalopram 10 MG tablet Commonly known as: LEXAPRO Take 10 mg by mouth daily.   losartan 25 MG tablet Commonly known as: COZAAR Take 1 tablet (25 mg total) by mouth daily.   nystatin cream Commonly known as: MYCOSTATIN Apply 1 application topically 2 (two) times daily as needed for dry skin (on bottom).   PRESERVISION AREDS 2 PO Take 1 tablet by mouth in the morning and at bedtime.   tamsulosin 0.4 MG Caps capsule Commonly known as: FLOMAX Take 1 capsule (0.4 mg total) by mouth daily.   traMADol 50 MG tablet Commonly known as: Ultram Take 0.5 tablets (25 mg total) by mouth every 6 (six) hours as needed for moderate pain or severe pain. What changed:  how much to take reasons to take this        Discharge Exam: Filed  Weights   06/24/22 1053  Weight: 71.4 kg   General exam: awake but drowsy, no acute distress HEENT: moist mucus membranes, hearing grossly normal  Respiratory system: CTAB no wheezes, rales or rhonchi, normal respiratory effort. Cardiovascular system: normal S1/S2, RRR, no pedal edema.   Gastrointestinal system: soft, NT, ND Central nervous system: limited exam due to dementia, grossly non-focal, normal speech Extremities: no edema, normal tone, no gross deformities Skin: dry, intact, normal temperature Psychiatry: normal mood, congruent affect, abnormal judgement and insight due to dementia   Condition at discharge: stable  The results of significant diagnostics from this hospitalization (including imaging, microbiology, ancillary and laboratory) are listed below for reference.   Imaging  Studies: CT ANGIO HEAD NECK W WO CM  Result Date: 06/25/2022 CLINICAL DATA:  Stroke/TIA, determine embolic source. Acute left internal capsule infarct on MRI. EXAM: CT ANGIOGRAPHY HEAD AND NECK TECHNIQUE: Multidetector CT imaging of the head and neck was performed using the standard protocol during bolus administration of intravenous contrast. Multiplanar CT image reconstructions and MIPs were obtained to evaluate the vascular anatomy. Carotid stenosis measurements (when applicable) are obtained utilizing NASCET criteria, using the distal internal carotid diameter as the denominator. RADIATION DOSE REDUCTION: This exam was performed according to the departmental dose-optimization program which includes automated exposure control, adjustment of the mA and/or kV according to patient size and/or use of iterative reconstruction technique. CONTRAST:  17m OMNIPAQUE IOHEXOL 350 MG/ML SOLN COMPARISON:  Head MRI 06/24/2022. Cervical spine CT 06/12/2022. Thoracic spine MRI 02/23/2021. FINDINGS: CT HEAD FINDINGS Brain: Focal hypodensity in the posterior limb of the left internal capsule corresponds to the acute infarct on MRI. No new infarct, intracranial hemorrhage, mass, midline shift, or extra-axial fluid collection is identified. There is mild cerebral atrophy. Hypodensities in the cerebral white matter bilaterally are nonspecific but compatible with mild chronic small vessel ischemic disease. Vascular: Calcified atherosclerosis at the skull base. Skull: No fracture or suspicious osseous lesion. Sinuses/Orbits: Small mucous retention cyst in the left maxillary sinus. Clear mastoid air cells. Bilateral cataract extraction. Other: None. Review of the MIP images confirms the above findings CTA NECK FINDINGS Aortic arch: Incomplete coverage of the aortic arch, with the origins of the brachiocephalic and left common carotid arteries not being imaged. Mild-to-moderate calcified plaque in the distal aspect of the arch. No  significant stenosis of the left subclavian artery origin. Mild stenosis of the right subclavian artery origin due to calcified plaque. Right carotid system: Patent with a small amount of calcified plaque at the carotid bifurcation. No evidence of a significant stenosis or dissection. Left carotid system: Patent with a small amount of calcified plaque at the carotid bifurcation. No evidence of a significant stenosis or dissection. Vertebral arteries: The vertebral arteries are patent and codominant without evidence of a significant stenosis or dissection. Skeleton: Chronic T1, T4, and T5 compression fractures. Other neck: Right thyroid nodule which has been previously evaluated by thyroid ultrasound and biopsy. Upper chest: Clear lung apices. Review of the MIP images confirms the above findings CTA HEAD FINDINGS Anterior circulation: The internal carotid arteries are patent from skull base to carotid termini with mild atherosclerotic plaque bilaterally not resulting in significant stenosis. ACAs and MCAs are patent without evidence of a proximal branch occlusion or significant proximal stenosis. No aneurysm is identified. Posterior circulation: Intracranial vertebral arteries are widely patent to the basilar. Patent PICA and SCA origins are seen bilaterally. The basilar artery is widely patent. There are moderate right and small left posterior communicating  arteries. Both PCAs are patent without evidence of a significant proximal stenosis. No aneurysm is identified. Venous sinuses: As permitted by contrast timing, patent. Anatomic variants: None. Review of the MIP images confirms the above findings IMPRESSION: 1. Mild atherosclerosis in the head and neck without a large vessel occlusion or flow limiting stenosis. 2. Aortic Atherosclerosis (ICD10-I70.0). Electronically Signed   By: Logan Bores M.D.   On: 06/25/2022 15:05   ECHOCARDIOGRAM COMPLETE  Result Date: 06/25/2022    ECHOCARDIOGRAM REPORT   Patient Name:    OZIAH VITANZA Date of Exam: 06/24/2022 Medical Rec #:  [id number]    Height:       72.0 in Accession #:    [id number]   Weight:       157.3 lb Date of Birth:  Mar 02, 1933    BSA:          1.924 m Patient Age:    34 years     BP:           166/91 mmHg Patient Gender: M            HR:           101 bpm. Exam Location:  [facility] Procedure: 2D Echo Indications:     Stroke I63.9  History:         Patient has no prior history of Echocardiogram examinations.  Sonographer:     Kathlen Brunswick RDCS Referring Phys:  [id number] UVOZDGUY AGBATA Diagnosing Phys: Nelva Bush MD  Sonographer Comments: Technically difficult study due to poor echo windows. Image acquisition challenging due to uncooperative patient. IMPRESSIONS  1. Left ventricular ejection fraction, by estimation, is 60 to 65%. The left ventricle has normal function. The left ventricle has no regional wall motion abnormalities. Left ventricular diastolic parameters are consistent with Grade I diastolic dysfunction (impaired relaxation).  2. Right ventricular systolic function is normal. The right ventricular size is normal. Tricuspid regurgitation signal is inadequate for assessing PA pressure.  3. The mitral valve is grossly normal. No evidence of mitral valve regurgitation. No evidence of mitral stenosis.  4. The aortic valve is tricuspid. Aortic valve regurgitation is not visualized. No aortic stenosis is present. FINDINGS  Left Ventricle: Left ventricular ejection fraction, by estimation, is 60 to 65%. The left ventricle has normal function. The left ventricle has no regional wall motion abnormalities. The left ventricular internal cavity size was normal in size. There is  borderline left ventricular hypertrophy. Left ventricular diastolic parameters are consistent with Grade I diastolic dysfunction (impaired relaxation). Right Ventricle: The right ventricular size is normal. No increase in right ventricular wall thickness. Right ventricular systolic function is normal.  Tricuspid regurgitation signal is inadequate for assessing PA pressure. Left Atrium: Left atrial size was normal in size. Right Atrium: Right atrial size was normal in size. Pericardium: The pericardium was not well visualized. Presence of epicardial fat layer. Mitral Valve: The mitral valve is grossly normal. No evidence of mitral valve regurgitation. No evidence of mitral valve stenosis. Tricuspid Valve: The tricuspid valve is not well visualized. Tricuspid valve regurgitation is not demonstrated. Aortic Valve: The aortic valve is tricuspid. Aortic valve regurgitation is not visualized. No aortic stenosis is present. Aortic valve peak gradient measures 12.8 mmHg. Pulmonic Valve: The pulmonic valve was not well visualized. Pulmonic valve regurgitation is not visualized. No evidence of pulmonic stenosis. Aorta: The aortic root is normal in size and structure. Pulmonary Artery: The pulmonary artery is of normal size. Venous: The inferior vena cava was  not well visualized. IAS/Shunts: The interatrial septum was not well visualized.  LEFT VENTRICLE PLAX 2D LVIDd:         3.86 cm   Diastology LVIDs:         2.66 cm   LV e' medial:    6.96 cm/s LV PW:         1.27 cm   LV E/e' medial:  13.4 LV IVS:        1.20 cm   LV e' lateral:   7.72 cm/s LVOT diam:     2.10 cm   LV E/e' lateral: 12.0 LV SV:         73 LV SV Index:   38 LVOT Area:     3.46 cm  RIGHT VENTRICLE RV Basal diam:  2.03 cm TAPSE (M-mode): 1.6 cm LEFT ATRIUM             Index        RIGHT ATRIUM          Index LA diam:        4.80 cm 2.50 cm/m   RA Area:     8.86 cm LA Vol (A2C):   20.3 ml 10.55 ml/m  RA Volume:   15.40 ml 8.00 ml/m LA Vol (A4C):   17.4 ml 9.04 ml/m LA Biplane Vol: 19.6 ml 10.19 ml/m  AORTIC VALVE AV Area (Vmax): 2.15 cm AV Vmax:        179.00 cm/s AV Peak Grad:   12.8 mmHg LVOT Vmax:      111.00 cm/s LVOT Vmean:     74.300 cm/s LVOT VTI:       0.210 m  AORTA Ao Root diam: 3.50 cm MITRAL VALVE MV Area (PHT): 9.03 cm     SHUNTS MV  Decel Time: 84 msec      Systemic VTI:  0.21 m MV E velocity: 93.00 cm/s   Systemic Diam: 2.10 cm MV A velocity: 116.00 cm/s MV E/A ratio:  0.80 Harrell Gave End MD Electronically signed by Nelva Bush MD Signature Date/Time: 06/25/2022/8:15:35 AM    Final    MR BRAIN WO CONTRAST  Result Date: 06/24/2022 CLINICAL DATA:  Stroke suspected.  Right leg weakness. EXAM: MRI HEAD WITHOUT CONTRAST TECHNIQUE: Multiplanar, multiecho pulse sequences of the brain and surrounding structures were obtained without intravenous contrast. COMPARISON:  Head CT 06/24/2022 and MRI 04/01/2020 FINDINGS: Brain: There is a small acute infarct in the posterior limb of the left internal capsule. Small T2 hyperintensities elsewhere in the cerebral white matter bilaterally have minimally progressed from the prior MRI and are nonspecific but compatible with mild chronic small vessel ischemic disease. There is mild cerebral atrophy. Vascular: Major intracranial vascular flow voids are preserved. Skull and upper cervical spine: Unremarkable bone marrow signal. Sinuses/Orbits: Bilateral cataract extraction. Small mucous retention cyst in the left maxillary sinus. Clear mastoid air cells. Other: None. IMPRESSION: 1. Small acute infarct in the posterior limb of the left internal capsule. 2. Mild chronic small vessel ischemic disease. Electronically Signed   By: Logan Bores M.D.   On: 06/24/2022 16:00   CT Head Wo Contrast  Result Date: 06/24/2022 CLINICAL DATA:  Neuro deficit, acute, stroke suspected EXAM: CT HEAD WITHOUT CONTRAST TECHNIQUE: Contiguous axial images were obtained from the base of the skull through the vertex without intravenous contrast. RADIATION DOSE REDUCTION: This exam was performed according to the departmental dose-optimization program which includes automated exposure control, adjustment of the mA and/or kV according to patient size and/or  use of iterative reconstruction technique. COMPARISON:  Head CT 06/12/2022  FINDINGS: Brain: No evidence of acute intracranial hemorrhage or abnormal extra-axial collection. Chronic basal ganglia lacunar infarcts. No new loss of gray-white matter differentiation.Patent basal cisterns. No concerning mass effect.The ventricles are unchanged in size.Scattered subcortical and periventricular white matter hypodensities, nonspecific but likely sequela of chronic small vessel ischemic disease.Mild cerebral atrophy Vascular: No hyperdense vessel.  Vascular calcifications. Skull: Negative for skull fracture. Chronic right nasal bone deformity. Sinuses/Orbits: Mild ethmoid air cell mucosal thickening. Orbits are unremarkable. Mastoid air cells are clear. Other: None. IMPRESSION: No acute intracranial abnormality. Unchanged mild sequela of chronic small vessel ischemic disease. Electronically Signed   By: Maurine Simmering M.D.   On: 06/24/2022 11:56   CT Cervical Spine Wo Contrast  Result Date: 06/12/2022 CLINICAL DATA:  Fall, striking head this morning. EXAM: CT CERVICAL SPINE WITHOUT CONTRAST TECHNIQUE: Multidetector CT imaging of the cervical spine was performed without intravenous contrast. Multiplanar CT image reconstructions were also generated. RADIATION DOSE REDUCTION: This exam was performed according to the departmental dose-optimization program which includes automated exposure control, adjustment of the mA and/or kV according to patient size and/or use of iterative reconstruction technique. COMPARISON:  09/15/2021 FINDINGS: Alignment: No vertebral subluxation is observed. Skull base and vertebrae: Severe loss of articular space between the anterior arch of C1 in the odontoid. Remote superior endplate compression at T1, with some faint residual sclerosis, similar to 09/15/2021. No new cervical spine fracture identified. No definite acute cervical spine fracture is identified. Soft tissues and spinal canal: Bilateral common carotid atherosclerotic calcification noted. Disc levels:  Intervertebral and facet spurring contribute to right foraminal stenosis at C3-4 and C6-7. Upper chest: Unremarkable Other: Incidental chronic left maxillary sinusitis is shown on image 19 series 6. IMPRESSION: 1. No acute cervical spine findings are identified. 2. Remote superior endplate compression at the T1 vertebral level, as shown on the prior exam. 3. Atherosclerosis. 4. Right foraminal narrowing at C3-4 and C6-7 due to spurring. 5. Chronic left maxillary sinusitis. Electronically Signed   By: Van Clines M.D.   On: 06/12/2022 16:01   Chest 2 View  Result Date: 06/12/2022 CLINICAL DATA:  Right renal cell carcinoma EXAM: CHEST - 2 VIEW COMPARISON:  06/14/2021 FINDINGS: Known superior endplate fracture at X54, acute or subacute, better shown on CT. Compression fractures with vertebral augmentations at T9, T10, T12, and L1. Rib deformities from old fractures. Atherosclerotic calcification of the aortic arch. The lungs appear clear. Heart size within normal limits. Degenerative glenohumeral arthropathy on the right. Trace blunting of both posterior costophrenic angles. IMPRESSION: 1. Trace blunting of both posterior costophrenic angles compatible with trace bilateral pleural effusions. 2. Known superior endplate fracture at M08, better shown on CT. 3. Thoracolumbar compressions with prior vertebral augmentations. 4.  Aortic Atherosclerosis (ICD10-I70.0). Electronically Signed   By: Van Clines M.D.   On: 06/12/2022 15:50   CT Abdomen Pelvis W Contrast  Result Date: 06/12/2022 CLINICAL DATA:  Renal cell carcinoma restaging.  Prior nephrectomy. * Tracking Code: BO * EXAM: CT ABDOMEN AND PELVIS WITH CONTRAST TECHNIQUE: Multidetector CT imaging of the abdomen and pelvis was performed using the standard protocol following bolus administration of intravenous contrast. RADIATION DOSE REDUCTION: This exam was performed according to the departmental dose-optimization program which includes automated  exposure control, adjustment of the mA and/or kV according to patient size and/or use of iterative reconstruction technique. CONTRAST:  181m OMNIPAQUE IOHEXOL 300 MG/ML  SOLN COMPARISON:  06/09/2021 FINDINGS: Lower chest: Scarring  or atelectasis anteriorly in the right upper lobe and in the lingula. Trace bilateral pleural effusions. Coronary artery atherosclerosis. Descending thoracic aortic atherosclerosis. Hepatobiliary: Chronic peripheral triangular hypodensity in the right hepatic lobe, 1.6 by 1.4 cm on image 38 series 2, no change from 06/09/2021, favoring remote scar or similar benign process. Gallbladder partially blurred by motion artifact but overall unremarkable. Pancreas: Unremarkable Spleen: Unremarkable Adrenals/Urinary Tract: Both adrenal glands appear normal. Right nephrectomy without recurrent malignancy along the nephrectomy site. 3.6 by 3.6 cm Bosniak category 1 cyst of the left mid kidney posteriorly, image 42 series 2. 2.0 by 1.7 cm complex homogeneous lesion of the left kidney lower pole anterolaterally with internal density of 43 Hounsfield units on portal venous phase images and 49 Hounsfield units on delayed images, previous workups by MRI have indicated that this is a Bosniak category 2 complex but benign cyst. These cystic lesions do not in and of themselves require further imaging workup. Urinary bladder unremarkable. Stomach/Bowel: Large periampullary duodenal diverticulum along with transverse duodenal diverticulum. Normal appendix. Borderline prominence of stool in the rectal vault. Mild sigmoid colon diverticulosis. No dilated bowel observed. Vascular/Lymphatic: Atherosclerosis is present, including aortoiliac atherosclerotic disease. Narrowing of the proximal celiac trunk without occlusion. No pathologic adenopathy observed. Reproductive: Prostatomegaly. Other: No supplemental non-categorized findings. Musculoskeletal: Mild sclerosis of the right anterior fourth, fifth, and sixth  ribs compatible with age-indeterminate fractures. Old ninth and eleventh rib fractures noted. Old left lower anterior rib fractures noted. Compression fractures with vertebral augmentations noted T9, T10, T12, and L1. There a new fracture along the superior endplate of X90, with mild distraction of the vertebral endplate up from the vertebral body on image 56 series 6. This fracture likely extends to the posterosuperior endplate, although no significant bony retropulsion is appreciated. No compelling findings of posterior element involvement. Accordingly this fracture is thought to involve the anterior and middle columns. There is some paraspinal edema in the vicinity of the fracture suggesting acute or subacute chronicity. Edema associated with this fracture was detected on the lumbar spine MRI of 05/29/2022. Mild bridging spurring of the left SI joint. Enchondroma in the right proximal femur. There is chronic posterior retropulsion associated with the T9 compression fracture, causing at least mild central narrowing of the thecal sac as well as bilateral foraminal stenosis at the T9-10 level. Multilevel lumbar impingement including left foraminal impingement at L3-4, L4-5, and L5-S1. IMPRESSION: 1. Acute fracture of T11 involving the anterior middle column, with mild distraction of the superior endplate and paraspinal edema. 2. No findings of recurrent malignancy. 3. Remote compression fractures and vertebral augmentations in the thoracic and lumbar spine, with suspected impingement at T9-10, L3-4, L4-5, and L5-S1. 4. Trace bilateral pleural effusions. These are new compared to the prior exam. 5. Other imaging findings of potential clinical significance: Aortic Atherosclerosis (ICD10-I70.0). Coronary atherosclerosis. Duodenal diverticula. Mildly prominent stool in the rectal vault. Sigmoid colon diverticulosis. Chronic or late subacute fractures of the right anterior fourth, fifth, and sixth ribs. Old healed  bilateral lower rib fractures. Electronically Signed   By: Van Clines M.D.   On: 06/12/2022 15:49   CT Head Wo Contrast  Result Date: 06/12/2022 CLINICAL DATA:  Fall, striking head.  Laceration. EXAM: CT HEAD WITHOUT CONTRAST TECHNIQUE: Contiguous axial images were obtained from the base of the skull through the vertex without intravenous contrast. RADIATION DOSE REDUCTION: This exam was performed according to the departmental dose-optimization program which includes automated exposure control, adjustment of the mA and/or kV according to  patient size and/or use of iterative reconstruction technique. COMPARISON:  05/01/2022 FINDINGS: Brain: The brainstem, cerebellum, cerebral peduncles, thalami, basal ganglia, basilar cisterns, and ventricular system appear within normal limits. Periventricular white matter and corona radiata hypodensities favor chronic ischemic microvascular white matter disease. No intracranial hemorrhage, mass lesion, or acute CVA. Vascular: Unremarkable Skull: Unremarkable Sinuses/Orbits: Chronic ethmoid sinusitis. Other: Right nasal bone discontinuity near the junction with the frontal process of the right maxilla, stable from 05/01/2022, compatible with prior fracture. IMPRESSION: 1. No acute intracranial findings. 2. Periventricular white matter and corona radiata hypodensities favor chronic ischemic microvascular white matter disease. 3. Remote right nasal fracture. 4. Chronic ethmoid sinusitis. Electronically Signed   By: Van Clines M.D.   On: 06/12/2022 15:32    Microbiology: Results for orders placed or performed during the hospital encounter of 01/23/21  Resp Panel by RT-PCR (Flu A&B, Covid) Nasopharyngeal Swab     Status: None   Collection Time: 01/23/21  6:42 AM   Specimen: Nasopharyngeal Swab; Nasopharyngeal(NP) swabs in vial transport medium  Result Value Ref Range Status   SARS Coronavirus 2 by RT PCR NEGATIVE NEGATIVE Final    Comment: (NOTE) SARS-CoV-2  target nucleic acids are NOT DETECTED.  The SARS-CoV-2 RNA is generally detectable in upper respiratory specimens during the acute phase of infection. The lowest concentration of SARS-CoV-2 viral copies this assay can detect is 138 copies/mL. A negative result does not preclude SARS-Cov-2 infection and should not be used as the sole basis for treatment or other patient management decisions. A negative result may occur with  improper specimen collection/handling, submission of specimen other than nasopharyngeal swab, presence of viral mutation(s) within the areas targeted by this assay, and inadequate number of viral copies(<138 copies/mL). A negative result must be combined with clinical observations, patient history, and epidemiological information. The expected result is Negative.  Fact Sheet for Patients:  EntrepreneurPulse.com.au  Fact Sheet for Healthcare Providers:  IncredibleEmployment.be  This test is no t yet approved or cleared by the [place] FDA and  has been authorized for detection and/or diagnosis of SARS-CoV-2 by FDA under an Emergency Use Authorization (EUA). This EUA will remain  in effect (meaning this test can be used) for the duration of the COVID-19 declaration under Section 564(b)(1) of the Act, 21 U.S.C.section 360bbb-3(b)(1), unless the authorization is terminated  or revoked sooner.       Influenza A by PCR NEGATIVE NEGATIVE Final   Influenza B by PCR NEGATIVE NEGATIVE Final    Comment: (NOTE) The Xpert Xpress SARS-CoV-2/FLU/RSV plus assay is intended as an aid in the diagnosis of influenza from Nasopharyngeal swab specimens and should not be used as a sole basis for treatment. Nasal washings and aspirates are unacceptable for Xpert Xpress SARS-CoV-2/FLU/RSV testing.  Fact Sheet for Patients: EntrepreneurPulse.com.au  Fact Sheet for Healthcare  Providers: IncredibleEmployment.be  This test is not yet approved or cleared by the [place] FDA and has been authorized for detection and/or diagnosis of SARS-CoV-2 by FDA under an Emergency Use Authorization (EUA). This EUA will remain in effect (meaning this test can be used) for the duration of the COVID-19 declaration under Section 564(b)(1) of the Act, 21 U.S.C. section 360bbb-3(b)(1), unless the authorization is terminated or revoked.  Performed at [facility], [address] [place], [place] [address]     Labs: CBC: Recent Labs  Lab 06/24/22 1059 06/27/22 0705 06/28/22 0449  WBC 5.4 7.0 7.4  NEUTROABS 3.5  --   --   HGB  11.8* 10.3* 11.3*  HCT 36.6* 31.5* 34.7*  MCV 92.4 91.0 91.3  PLT 181 150 595   Basic Metabolic Panel: Recent Labs  Lab 06/24/22 1059 06/27/22 0705 06/28/22 0449 06/29/22 0612  NA 137 142 143 144  K 3.8 2.8* 3.6 3.4*  CL 103 111 111 110  CO2 '26 24 25 23  '$ GLUCOSE 93 120* 108* 107*  BUN 26* 27* 25* 28*  CREATININE 0.95 0.79 0.71 0.73  CALCIUM 8.9 8.3* 8.6* 9.0  MG  --  2.1  --  2.2  PHOS  --   --  2.3* 3.1   Liver Function Tests: Recent Labs  Lab 06/24/22 1059 06/27/22 0705  AST 17 18  ALT <5 <5  ALKPHOS 77 62  BILITOT 0.9 1.2  PROT 7.1 6.0*  ALBUMIN 4.2 3.3*   CBG: Recent Labs  Lab 06/26/22 1535  GLUCAP 119*    Discharge time spent: greater than 30 minutes.  Signed: Ezekiel Slocumb, DO [facility] 06/29/2022

## 2022-06-29 NOTE — Care Management Important Message (Signed)
Important Message  Patient Details  Name: Kevin [name] MRN: [id number] Date of Birth: 05-10-1933   Medicare Important Message Given:  Yes     Juliann Pulse A [name] 06/29/2022, 9:05 AM

## 2022-06-29 NOTE — TOC Transition Note (Signed)
Transition of Care [facility]) - CM/SW Discharge Note   Patient Details  Name: Kevin [name] MRN: [id number] Date of Birth: [birth year]  [facility] CM/SW Contact:  Laurena Slimmer, RN Phone Number: 06/29/2022, 12:13 PM   Clinical Narrative:    9:26am Discharge order received.Spoke with Leonette Nutting to confirm discharge summary had been received. Facility will transport patient back to facility  11:04am Spoke with patient's wife to advised of discharge back to facility today. Wife advised facility would transport patient.   11:35am Call retrieved from [facility] page in admissions stated she had spoken with Mrs. Laflamme and she prefers EMS transport. EMS scheduled. Nurse notified with room number and where to call report. Face sheet and medical necessity form added to EMS packet.   11:40am Spoke with Mrs. Bilski in patient's room. To advised her EMS had been arrange. TOC signing off         Patient Goals and CMS Choice        Discharge Placement                       Discharge Plan and Services                                     Social Determinants of Health (SDOH) Interventions     Readmission Risk Interventions     No data to display

## 2022-07-06 NOTE — Progress Notes (Signed)
I have looked through the record and I cannot find a record on this gentleman that I put in.  I do not know why he got the BNP.  If you can point me in the direction of a record that I did contribute to this gentleman's chart that has something about the BNP I be happy to respond otherwise.

## 2022-07-18 ENCOUNTER — Non-Acute Institutional Stay: Payer: Medicare HMO | Admitting: Student

## 2022-07-18 DIAGNOSIS — F0280 Dementia in other diseases classified elsewhere without behavioral disturbance: Secondary | ICD-10-CM

## 2022-07-18 DIAGNOSIS — E46 Unspecified protein-calorie malnutrition: Secondary | ICD-10-CM

## 2022-07-18 DIAGNOSIS — Z515 Encounter for palliative care: Secondary | ICD-10-CM

## 2022-07-18 DIAGNOSIS — R531 Weakness: Secondary | ICD-10-CM

## 2022-07-18 DIAGNOSIS — I639 Cerebral infarction, unspecified: Secondary | ICD-10-CM

## 2022-07-18 NOTE — Progress Notes (Signed)
[facility] Consult Note Telephone: [phone]  Fax: [phone]   Date of encounter: 07/18/22 9:08 PM PATIENT NAME: Kevin [name] [id number]   [phone] (home)  DOB: [birth year] MRN: [id number] PRIMARY CARE PROVIDER:    Baxter Hire, MD,  [place] [address] [phone]  REFERRING PROVIDER:   Dr. Ouida Sills  RESPONSIBLE PARTY:    Contact Information     Name Relation Home Work Mobile   [place] A Spouse [phone]  [phone]        I met face to face with patient in facility. Palliative Care was asked to follow this patient by consultation request of  Dr. Ouida Sills to address advance care planning and complex medical decision making. This is the initial visit.                                     ASSESSMENT AND PLAN / RECOMMENDATIONS:   Advance Care Planning/Goals of Care: Goals include to maximize quality of life and symptom management. Patient/health care surrogate gave his/her permission to discuss.Our advance care planning conversation included a discussion about:    The value and importance of advance care planning  Experiences with loved ones who have been seriously ill or have died  Exploration of personal, cultural or spiritual beliefs that might influence medical decisions  Exploration of goals of care in the event of a sudden injury or illness  CODE STATUS: DNR  Education provided on Palliative Medicine vs. Hospice services. Will provide supportive care, symptom management as needed. Will monitor for changes/declines. Patient to remain at SNF until he functionally improves. Discussed with wife via telephone.   Symptom Management/Plan:  Dementia, Parkinson's disease-staff to reorient and redirect as needed. Staff to continue assisting with adl's. Continue carbidopa-levodopa, escitalopram and trazodone QHS as directed. Monitor for  worsening behaviors, falls/safety.   CVA-patient with generalized weakness; requires assistance with all adl's. Patient with dysphagia; continue mechanical soft diet with thin liquids. Monitor for worsening dysphagia; aspiration precautions..   Generalized weakness- secondary to CVA, dementia, Parkinson's disease. Staff to continue assisting with adl's. Patient requires max assist with adl's. Continue therapy as directed.  Protein calorie malnutrition, weight loss-continue mechanical soft diet, thin liquids. Ensure BID, magic cup BID. Routine weights per facility. Albumin 3.3.   Follow up Palliative Care Visit: Palliative care will continue to follow for complex medical decision making, advance care planning, and clarification of goals. Return in 4-6 weeks or prn.   This visit was coded based on medical decision making (MDM).  PPS: 40%  HOSPICE ELIGIBILITY/DIAGNOSIS: TBD  Chief Complaint: Palliative Medicine initial consult.   HISTORY OF PRESENT ILLNESS:  Kevin [name] is a 88 y.o. year old male  with CVA, dysphagia, dementia with behavioral disturbances, Parkinson's disease, GERD, hypokalemia, hypertension, renal cell carcinoma. Patient recently hospitalized 9/3-06/29/22 due to acute ischemic stroke, dementia, Parkinson's disease, oliguria.  Patient at [facility]; currently on SNF unit. He requires assistance with all adl's. Patient requires assist x 2 for transfers, ambulation. He is able to feed himself. Intake varies from 25-100% of meals. He is s/p fall on 07/16/22; no apparent injury reported.   Patient received resting in common area. He arouses to stimulation. His speech is mostly garbled, few words are clear. He does answer some direct questions. His hearing aids do not appear to be charged.  He does not exhibit any signs of pain or discomfort. Patient left resting in recliner in common area.   History obtained from review of EMR, discussion with primary team, and interview with family,  facility staff/caregiver and/or Mr. Mounsey.  I reviewed available labs, medications, imaging, studies and related documents from the EMR.  Records reviewed and summarized above.   ROS  Unable to contribute d/t dementia.   Physical Exam: Weight: 134.4 pounds on 07/11/22, 145.7 on 07/04/22 Pulse 76, resp 16, sats 97% on room air Constitutional: NAD General: frail appearing, thin EYES: anicteric sclera, lids intact, no discharge  ENMT: hard of  hearing, oral mucous membranes moist CV: S1S2, RRR, no LE edema Pulmonary: LCTA, no increased work of breathing, no cough Abdomen: normo-active BS + 4 quadrants, soft and non tender GU: deferred MSK: + sarcopenia, moves all extremities Skin: warm and dry, no rashes or wounds on visible skin Neuro: +generalized weakness, A & O to person, place Psych: non-anxious affect Hem/lymph/immuno: no widespread bruising CURRENT PROBLEM LIST:  Patient Active Problem List   Diagnosis Date Noted   Dysphagia due to recent cerebrovascular accident (CVA) 06/28/2022   Oliguria 06/27/2022   CVA (cerebral vascular accident) ([place]) 06/25/2022   Acute ischemic stroke ([place]) 06/24/2022   Acute pancreatitis 01/23/2021   Hypokalemia 01/23/2021   Dementia ([place])    GERD (gastroesophageal reflux disease)    Hypertension    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[place])    Anemia 10/17/2020   Leukopenia 10/17/2020   Thrombocytopenia ([place]) 10/17/2020   CAP (community acquired pneumonia) 03/04/2019   Right renal mass 11/17/2018   PAST MEDICAL HISTORY:  Active Ambulatory Problems    Diagnosis Date Noted   Right renal mass 11/17/2018   CAP (community acquired pneumonia) 03/04/2019   Anemia 10/17/2020   Leukopenia 10/17/2020   Thrombocytopenia ([place]) 10/17/2020   Acute pancreatitis 01/23/2021   Hypokalemia 01/23/2021   Dementia ([facility])    GERD (gastroesophageal reflux disease)    Hypertension    Dementia due to Parkinson's disease without  behavioral disturbance ([place])    Acute ischemic stroke ([place]) 06/24/2022   CVA (cerebral vascular accident) ([place]) 06/25/2022   Oliguria 06/27/2022   Dysphagia due to recent cerebrovascular accident (CVA) 06/28/2022   Resolved Ambulatory Problems    Diagnosis Date Noted   Acute urinary retention 06/27/2022   Hypophosphatemia 06/28/2022   Past Medical History:  Diagnosis Date   Asthma    Broken arm    Cancer ([place]) 07/2018   Cataract cortical, senile    Chronic kidney disease    Colon polyps    E coli infection 2020   HOH (hard of hearing)    Hyperlipidemia    Osteoporosis    Pancreatitis    Pneumonia    SOCIAL HX:  Social History   Tobacco Use   Smoking status: Never   Smokeless tobacco: Never  Substance Use Topics   Alcohol use: Never   FAMILY HX:  Family History  Problem Relation Age of Onset   Heart disease Mother    Pancreatitis Mother    Emphysema Father    Breast cancer Sister    Breast cancer Sister       ALLERGIES: No Known Allergies   PERTINENT MEDICATIONS:  Outpatient Encounter Medications as of 07/18/2022  Medication Sig   acetaminophen (TYLENOL) 500 MG tablet Take 1,000 mg by mouth every 8 (eight) hours as needed for moderate pain.   albuterol (VENTOLIN HFA) 108 (90 Base) MCG/ACT inhaler  Inhale 2 puffs into the lungs every 6 (six) hours as needed for wheezing or shortness of breath.   alendronate (FOSAMAX) 70 MG tablet Take 70 mg by mouth every Sunday.   amLODipine (NORVASC) 10 MG tablet Take 10 mg by mouth daily.   aspirin 81 MG chewable tablet Chew 1 tablet (81 mg total) by mouth daily.   atorvastatin (LIPITOR) 40 MG tablet Take 1 tablet (40 mg total) by mouth daily.   carbidopa-levodopa (SINEMET IR) 25-100 MG tablet Take 1-1.5 tablets by mouth See admin instructions. Take 1.5 tablet (0800) by mouth in the morning, then 1.5 tablet (1200) by mouth at noon, then 1.5 tablet by mouth at 5 pm (1700), then 1 tablet by mouth at bedtime (2200), then 1 tablet at  0300.   cyanocobalamin (VITAMIN B12) 1000 MCG tablet Take 1 tablet (1,000 mcg total) by mouth 2 (two) times a week.   diclofenac Sodium (VOLTAREN) 1 % GEL Apply 1 application topically 2 (two) times daily as needed (pain).   escitalopram (LEXAPRO) 10 MG tablet Take 10 mg by mouth daily.   losartan (COZAAR) 25 MG tablet Take 1 tablet (25 mg total) by mouth daily.   Menthol-Methyl Salicylate (BEN GAY GREASELESS) 10-15 % greaseless cream Apply 1 application topically daily as needed for pain.   Multiple Vitamins-Minerals (PRESERVISION AREDS 2 PO) Take 1 tablet by mouth in the morning and at bedtime.   nystatin cream (MYCOSTATIN) Apply 1 application topically 2 (two) times daily as needed for dry skin (on bottom).   tamsulosin (FLOMAX) 0.4 MG CAPS capsule Take 1 capsule (0.4 mg total) by mouth daily.   traMADol (ULTRAM) 50 MG tablet Take 0.5 tablets (25 mg total) by mouth every 6 (six) hours as needed for moderate pain or severe pain.   No facility-administered encounter medications on file as of 07/18/2022.   Thank you for the opportunity to participate in the care of Mr. Mcclish.  The palliative care team will continue to follow. Please call our office at [phone] if we can be of additional assistance.   Ezekiel Slocumb, NP   COVID-19 PATIENT SCREENING TOOL Asked and negative response unless otherwise noted:  Have you had symptoms of covid, tested positive or been in contact with someone with symptoms/positive test in the past 5-10 days? No

## 2022-08-22 ENCOUNTER — Non-Acute Institutional Stay: Payer: Medicare HMO | Admitting: Student

## 2022-08-22 DIAGNOSIS — Z515 Encounter for palliative care: Secondary | ICD-10-CM

## 2022-08-22 DIAGNOSIS — F0284 Dementia in other diseases classified elsewhere, unspecified severity, with anxiety: Secondary | ICD-10-CM

## 2022-08-22 DIAGNOSIS — E46 Unspecified protein-calorie malnutrition: Secondary | ICD-10-CM

## 2022-08-22 DIAGNOSIS — R634 Abnormal weight loss: Secondary | ICD-10-CM

## 2022-08-22 NOTE — Progress Notes (Signed)
[facility] Consult Note Telephone: [phone]  Fax: [phone]    Date of encounter: 08/22/22 4:44 PM PATIENT NAME: Kevin Kevin [name] [id number]   [phone] (home)  DOB: May 04, 1933 MRN: [id number] PRIMARY CARE PROVIDER:    Baxter Hire, MD,  [place] [address] [phone]  REFERRING PROVIDER:   Baxter Hire, MD [place],  [place] [address] [phone]  RESPONSIBLE PARTY:    Contact Information     Name Relation Home Work Mobile   Kevin Kevin [name] Spouse [phone]  [phone]        I met face to face with patient in the facility. Palliative Care was asked to follow this patient by consultation request of   Dr. Ouida Sills  to address advance care planning and complex medical decision making. This is Kevin [name] follow up visit.                                   ASSESSMENT AND PLAN / RECOMMENDATIONS:   Advance Care Planning/Goals of Care: Goals include to maximize quality of life and symptom management. Patient/health care surrogate gave his/her permission to discuss. Our advance care planning conversation included Kevin [name] discussion about:    The value and importance of advance care planning  Experiences with loved ones who have been seriously ill or have died  Exploration of personal, cultural or spiritual beliefs that might influence medical decisions  Exploration of goals of care in the event of Kevin Kevin [name]  CODE STATUS: DNR  Education provided on palliative medicine vs. Hospice services. Wife is currently appealing skilled services ending. We discussed hospice given his functional declines, weight loss. Would like follow up early part of next week to discuss changes, further discussion on hospice. Patient will be remaining at facility long term.   Symptom Management/Plan:  Dementia, Parkinson's disease-patient with  functional decline, increased weakness in past several days; now requiring assist x 2 for transfers. Skilled days are set to end; wife is appealing. Patient with increased confusion, anxiety, agitation which is worse in the evenings. Staff report increased somnolence, confusion since medications adjusted. Recommend discontinuing his Buspar. Continue carbidopa-levodopa, escitalopram and trazodone QHS as directed. Has lorazepam PRN for anxiety, which has been helpful.  Protein calorie malnutrition, weight loss-Patient's appetite varies from 25-50%. He is able to feed himself. Continue Ensure BID, magic cup ordered, but he does not like. Encourage foods, snacks, patient enjoys. Routine weights per facility.  Follow up Palliative Care Visit: Palliative care will continue to follow for complex medical decision making, advance care planning, and clarification of goals. Return in 4 weeks or prn.   This visit was coded based on medical decision making (MDM).  PPS: 30%  HOSPICE ELIGIBILITY/DIAGNOSIS: TBD  Chief Complaint: Palliative Medicine follow up visit.   HISTORY OF PRESENT [name]:  Kevin Kevin [name] is Kevin [name] 88 y.o. year old male  with  dementia with behavioral disturbances, Parkinson's disease, CVA, dysphagia, GERD, hypokalemia, hypertension, renal cell carcinoma. Patient recently hospitalized 9/3-06/29/22 due to acute ischemic stroke, dementia, Parkinson's disease, oliguria.   Patient at [facility] [facility]; currently on SNF unit. He was experiencing increased agitation, anxiety; behaviors worse in the evenings. His Lexapro was increased last week and also started on Buspar. Increased somnolence reported; he had difficulty working with therapy in the mornings. Staff report patient sleeps most  of shift, but is more alert later in the day. Increased weakness; is requiring assist x 2 in past several days. He is eating 25-50% of meals. Skilled days are to end on Friday; wife is appealing decision. Patient received resting in  geri chair in common area. He arouses to stimulation. Speech is mostly mumbled, some words clear. He is transferred with assist x 2 to w/c to obtain weight. He is cooperative.   History obtained from review of EMR, discussion with primary team, and interview with family, facility staff/caregiver and/or Kevin Kevin [name].  I reviewed available labs, medications, imaging, studies and related documents from the EMR.  Records reviewed and summarized above.   ROS  Unable to contribute d/t dementia.   Physical Exam: Weight: 143.3 pounds Constitutional: NAD General: frail appearing, thin EYES: anicteric sclera, lids intact, no discharge  ENMT: intact hearing, oral mucous membranes moist CV: S1S2, RRR, no LE edema Pulmonary: LCTA, no increased work of breathing, no cough, room air Abdomen: normo-active BS + 4 quadrants, soft and non tender GU: deferred MSK: + sarcopenia, moves all extremities Skin: warm and dry, no rashes or wounds on visible skin Neuro: + generalized weakness, +cognitive impairment Psych: non-anxious affect, Kevin [name] and O to person, place Hem/lymph/immuno: no widespread bruising   Thank you for the opportunity to participate in the care of Kevin Kevin [name].  The palliative care team will continue to follow. Please call our office at [phone] if we can be of additional assistance.   Kevin Slocumb, NP   COVID-19 PATIENT SCREENING TOOL Asked and negative response unless otherwise noted:   Have you had symptoms of covid, tested positive or been in contact with someone with symptoms/positive test in the past 5-10 days? No

## 2022-08-31 ENCOUNTER — Telehealth: Payer: Self-pay | Admitting: Student

## 2022-08-31 NOTE — Telephone Encounter (Signed)
Palliative NP left message for wife to follow up on patient, conversation last week. Awaiting return call.

## 2022-10-22 DEATH — deceased

## 2022-11-14 ENCOUNTER — Encounter: Payer: Self-pay | Admitting: Oncology

## 2022-11-14 NOTE — Telephone Encounter (Signed)
Jenn,can you mark pt as deceased please.

## 2023-06-19 ENCOUNTER — Other Ambulatory Visit: Payer: Medicare HMO

## 2023-06-25 ENCOUNTER — Ambulatory Visit: Payer: Medicare HMO | Admitting: Oncology
